# Patient Record
Sex: Female | Born: 1960 | Race: White | Hispanic: No | Marital: Married | Smoking: Former smoker
Health system: Southern US, Community
[De-identification: ages and names within clinical notes are randomized; demographics above are authoritative.]

## PROBLEM LIST (undated history)

## (undated) DIAGNOSIS — K635 Polyp of colon: Secondary | ICD-10-CM

## (undated) DIAGNOSIS — M75100 Unspecified rotator cuff tear or rupture of unspecified shoulder, not specified as traumatic: Secondary | ICD-10-CM

## (undated) DIAGNOSIS — R0781 Pleurodynia: Secondary | ICD-10-CM

## (undated) DIAGNOSIS — J45909 Unspecified asthma, uncomplicated: Secondary | ICD-10-CM

## (undated) DIAGNOSIS — K56609 Unspecified intestinal obstruction, unspecified as to partial versus complete obstruction: Secondary | ICD-10-CM

## (undated) DIAGNOSIS — K900 Celiac disease: Secondary | ICD-10-CM

## (undated) DIAGNOSIS — J449 Chronic obstructive pulmonary disease, unspecified: Secondary | ICD-10-CM

## (undated) DIAGNOSIS — K5010 Crohn's disease of large intestine without complications: Secondary | ICD-10-CM

## (undated) DIAGNOSIS — K8020 Calculus of gallbladder without cholecystitis without obstruction: Secondary | ICD-10-CM

## (undated) DIAGNOSIS — C801 Malignant (primary) neoplasm, unspecified: Secondary | ICD-10-CM

## (undated) DIAGNOSIS — K5190 Ulcerative colitis, unspecified, without complications: Secondary | ICD-10-CM

## (undated) DIAGNOSIS — K589 Irritable bowel syndrome without diarrhea: Secondary | ICD-10-CM

## (undated) DIAGNOSIS — E8801 Alpha-1-antitrypsin deficiency: Secondary | ICD-10-CM

## (undated) DIAGNOSIS — I313 Pericardial effusion (noninflammatory): Secondary | ICD-10-CM

## (undated) HISTORY — DX: Crohn's disease of large intestine without complications: K50.10

## (undated) HISTORY — DX: Alpha-1-antitrypsin deficiency: E88.01

## (undated) HISTORY — DX: Unspecified intestinal obstruction, unspecified as to partial versus complete obstruction: K56.609

## (undated) HISTORY — DX: Irritable bowel syndrome, unspecified: K58.9

## (undated) HISTORY — DX: Celiac disease: K90.0

## (undated) HISTORY — PX: FOOT SURGERY: SHX648

## (undated) HISTORY — DX: Pericardial effusion (noninflammatory): I31.3

## (undated) HISTORY — DX: Polyp of colon: K63.5

## (undated) HISTORY — PX: TONSILLECTOMY: SUR1361

## (undated) HISTORY — DX: Unspecified rotator cuff tear or rupture of unspecified shoulder, not specified as traumatic: M75.100

## (undated) HISTORY — DX: Pleurodynia: R07.81

## (undated) HISTORY — PX: CHOLECYSTECTOMY: SHX55

## (undated) HISTORY — PX: BUNIONECTOMY WITH HAMMERTOE RECONSTRUCTION: SHX5600

## (undated) HISTORY — DX: Ulcerative colitis, unspecified, without complications: K51.90

## (undated) HISTORY — DX: Calculus of gallbladder without cholecystitis without obstruction: K80.20

## (undated) HISTORY — PX: ABDOMINAL HYSTERECTOMY: SHX81

---

## 1988-03-16 DIAGNOSIS — C569 Malignant neoplasm of unspecified ovary: Secondary | ICD-10-CM

## 1988-03-16 HISTORY — DX: Malignant neoplasm of unspecified ovary: C56.9

## 2015-06-12 ENCOUNTER — Emergency Department (HOSPITAL_COMMUNITY)
Admission: EM | Admit: 2015-06-12 | Discharge: 2015-06-12 | Disposition: A | Discharge status: Home / Self Care | Payer: Medicaid Other | Attending: Emergency Medicine | Admitting: Emergency Medicine

## 2015-06-12 ENCOUNTER — Encounter (HOSPITAL_COMMUNITY): Payer: Self-pay | Admitting: *Deleted

## 2015-06-12 ENCOUNTER — Emergency Department (HOSPITAL_COMMUNITY): Payer: Medicaid Other

## 2015-06-12 DIAGNOSIS — L80 Vitiligo: Secondary | ICD-10-CM | POA: Diagnosis not present

## 2015-06-12 DIAGNOSIS — Z859 Personal history of malignant neoplasm, unspecified: Secondary | ICD-10-CM | POA: Diagnosis not present

## 2015-06-12 DIAGNOSIS — R0789 Other chest pain: Secondary | ICD-10-CM | POA: Insufficient documentation

## 2015-06-12 DIAGNOSIS — J449 Chronic obstructive pulmonary disease, unspecified: Secondary | ICD-10-CM | POA: Diagnosis not present

## 2015-06-12 DIAGNOSIS — R079 Chest pain, unspecified: Secondary | ICD-10-CM | POA: Diagnosis present

## 2015-06-12 HISTORY — DX: Chronic obstructive pulmonary disease, unspecified: J44.9

## 2015-06-12 HISTORY — DX: Malignant (primary) neoplasm, unspecified: C80.1

## 2015-06-12 HISTORY — DX: Unspecified asthma, uncomplicated: J45.909

## 2015-06-12 LAB — BASIC METABOLIC PANEL
ANION GAP: 9 (ref 5–15)
BUN: 14 mg/dL (ref 6–20)
CHLORIDE: 103 mmol/L (ref 101–111)
CO2: 26 mmol/L (ref 22–32)
Calcium: 9.7 mg/dL (ref 8.9–10.3)
Creatinine, Ser: 0.95 mg/dL (ref 0.44–1.00)
Glucose, Bld: 102 mg/dL — ABNORMAL HIGH (ref 65–99)
POTASSIUM: 4.1 mmol/L (ref 3.5–5.1)
SODIUM: 138 mmol/L (ref 135–145)

## 2015-06-12 LAB — CBC
HEMATOCRIT: 38 % (ref 36.0–46.0)
HEMOGLOBIN: 12.8 g/dL (ref 12.0–15.0)
MCH: 30.4 pg (ref 26.0–34.0)
MCHC: 33.7 g/dL (ref 30.0–36.0)
MCV: 90.3 fL (ref 78.0–100.0)
Platelets: 265 10*3/uL (ref 150–400)
RBC: 4.21 MIL/uL (ref 3.87–5.11)
RDW: 12.3 % (ref 11.5–15.5)
WBC: 7.1 10*3/uL (ref 4.0–10.5)

## 2015-06-12 LAB — TROPONIN I

## 2015-06-12 MED ORDER — IBUPROFEN 400 MG PO TABS: 400.0000 mg | ORAL_TABLET | Freq: Four times a day (QID) | ORAL | Status: DC | PRN

## 2015-06-12 NOTE — ED Notes (Signed)
Patient not in room yet 

## 2015-06-12 NOTE — Care Management (Signed)
ED CM scheduled follow up apt with [facility] walk-in [facility] 3/31 at [place] for appt provided to patient.

## 2015-06-12 NOTE — ED Provider Notes (Signed)
CSN: [id number]     Arrival date & time 06/12/15  1804 History   First MD Initiated Contact with Patient 06/12/15 2252     Chief Complaint  Patient presents with  . Chest Pain      Patient is a 55 y.o. female presenting with chest pain. The history is provided by the patient.  Chest Pain Associated symptoms: no abdominal pain, no back pain, no headache, no nausea, no numbness, no shortness of breath, not vomiting and no weakness   patient withAnterior chest pain over the last 3 weeks. His been constant. Worse with certain movements. Worse with deep breath. No swelling or legs. She did have a remote history of pericardial window for pericardial effusion. No fevers or chills. No cough. States she does not have a primary care doctor yet. Will tear from [place]. No swelling in her legs. Pain is dull and constant. No radiation to neck or arm.  Past Medical History  Diagnosis Date  . Asthma   . COPD (chronic obstructive pulmonary disease) ([place])   . Cancer [facility])    History reviewed. No pertinent past surgical history. No family history on file. Social History  Substance Use Topics  . Smoking status: Former Research scientist (life sciences)  . Smokeless tobacco: None  . Alcohol Use: No   OB History    No data available     Review of Systems  Constitutional: Negative for activity change and appetite change.  Eyes: Negative for pain.  Respiratory: Negative for chest tightness and shortness of breath.   Cardiovascular: Positive for chest pain. Negative for leg swelling.  Gastrointestinal: Negative for nausea, vomiting, abdominal pain and diarrhea.  Genitourinary: Negative for flank pain.  Musculoskeletal: Negative for back pain and neck stiffness.  Skin: Negative for rash.  Neurological: Negative for weakness, numbness and headaches.  Psychiatric/Behavioral: Negative for behavioral problems.      Allergies  Review of patient's allergies indicates no known allergies.  Home Medications   Prior to  Admission medications   Medication Sig Start Date End Date Taking? Authorizing Provider  ibuprofen (ADVIL,MOTRIN) 400 MG tablet Take 1 tablet (400 mg total) by mouth every 6 (six) hours as needed. 06/12/15   Davonna Belling, MD   BP 129/80 mmHg  Pulse 62  Temp(Src) 97.7 F (36.5 C) (Oral)  Resp 20  Wt 170 lb 8 oz (77.338 kg)  SpO2 98% Physical Exam  Constitutional: She is oriented to person, place, and time. She appears well-developed and well-nourished.  HENT:  Head: Normocephalic and atraumatic.  Eyes: Pupils are equal, round, and reactive to light.  Neck: Normal range of motion.  Cardiovascular: Normal rate, regular rhythm and normal heart sounds.   No murmur heard. Pulmonary/Chest: Effort normal and breath sounds normal. No respiratory distress. She has no wheezes. She has no rales. She exhibits tenderness.  Tenderness to anterior chest wall, particularly on the side. No rash.  Abdominal: Soft. Bowel sounds are normal. She exhibits no distension. There is no tenderness.  Musculoskeletal: Normal range of motion.  Neurological: She is alert and oriented to person, place, and time. No cranial nerve deficit.  Skin: Skin is warm and dry.  Patient has vitiligo  Psychiatric: She has a normal mood and affect. Her speech is normal.  Nursing note and vitals reviewed.   ED Course  Procedures (including critical care time) Labs Review Labs Reviewed  BASIC METABOLIC PANEL - Abnormal; Notable for the following:    Glucose, Bld 102 (*)    All other components  within normal limits  CBC  TROPONIN I    Imaging Review Dg Chest 2 View  06/12/2015  CLINICAL DATA:  Chest pain for 3 weeks EXAM: CHEST  2 VIEW COMPARISON:  None. FINDINGS: Cardiomediastinal silhouette is unremarkable. No infiltrate or pleural effusion. No pulmonary edema. Bony thorax is unremarkable. IMPRESSION: No active cardiopulmonary disease. Electronically Signed   By: Lahoma Crocker M.D.   On: 06/12/2015 19:39   I have  personally reviewed and evaluated these images and lab results as part of my medical decision-making.   EKG Interpretation   Date/Time:  Wednesday June 12 2015 18:11:57 EDT Ventricular Rate:  69 PR Interval:  134 QRS Duration: 90 QT Interval:  388 QTC Calculation: 415 R Axis:   78 Text Interpretation:  Normal sinus rhythm Nonspecific ST abnormality  Abnormal ECG Confirmed by Alvino Chapel  MD, Ovid Curd [phone]) on 06/12/2015  10:54:21 PM      MDM   Final diagnoses:  Chest pain, unspecified chest pain type    Patient with chest pain. Likely chest wall but has had pericardial effusion the past and is post window. She does not know exactly why she had the fluid. Does not her primary care doctor in town. Case management will help arrange this. Will discharge home.    Davonna Belling, MD 06/13/15 [phone]

## 2015-06-12 NOTE — Discharge Instructions (Signed)
Nonspecific Chest Pain  °Chest pain can be caused by many different conditions. There is always a chance that your pain could be related to something serious, such as a heart attack or a blood clot in your lungs. Chest pain can also be caused by conditions that are not life-threatening. If you have chest pain, it is very important to follow up with your health care provider. °CAUSES  °Chest pain can be caused by: °· Heartburn. °· Pneumonia or bronchitis. °· Anxiety or stress. °· Inflammation around your heart (pericarditis) or lung (pleuritis or pleurisy). °· A blood clot in your lung. °· A collapsed lung (pneumothorax). It can develop suddenly on its own (spontaneous pneumothorax) or from trauma to the chest. °· Shingles infection (varicella-zoster virus). °· Heart attack. °· Damage to the bones, muscles, and cartilage that make up your chest wall. This can include: °¨ Bruised bones due to injury. °¨ Strained muscles or cartilage due to frequent or repeated coughing or overwork. °¨ Fracture to one or more ribs. °¨ Sore cartilage due to inflammation (costochondritis). °RISK FACTORS  °Risk factors for chest pain may include: °· Activities that increase your risk for trauma or injury to your chest. °· Respiratory infections or conditions that cause frequent coughing. °· Medical conditions or overeating that can cause heartburn. °· Heart disease or family history of heart disease. °· Conditions or health behaviors that increase your risk of developing a blood clot. °· Having had chicken pox (varicella zoster). °SIGNS AND SYMPTOMS °Chest pain can feel like: °· Burning or tingling on the surface of your chest or deep in your chest. °· Crushing, pressure, aching, or squeezing pain. °· Dull or sharp pain that is worse when you move, cough, or take a deep breath. °· Pain that is also felt in your back, neck, shoulder, or arm, or pain that spreads to any of these areas. °Your chest pain may come and go, or it may stay  constant. °DIAGNOSIS °Lab tests or other studies may be needed to find the cause of your pain. Your health care provider may have you take a test called an ambulatory ECG (electrocardiogram). An ECG records your heartbeat patterns at the time the test is performed. You may also have other tests, such as: °· Transthoracic echocardiogram (TTE). During echocardiography, sound waves are used to create a picture of all of the heart structures and to look at how blood flows through your heart. °· Transesophageal echocardiogram (TEE). This is a more advanced imaging test that obtains images from inside your body. It allows your health care provider to see your heart in finer detail. °· Cardiac monitoring. This allows your health care provider to monitor your heart rate and rhythm in real time. °· Holter monitor. This is a portable device that records your heartbeat and can help to diagnose abnormal heartbeats. It allows your health care provider to track your heart activity for several days, if needed. °· Stress tests. These can be done through exercise or by taking medicine that makes your heart beat more quickly. °· Blood tests. °· Imaging tests. °TREATMENT  °Your treatment depends on what is causing your chest pain. Treatment may include: °· Medicines. These may include: °¨ Acid blockers for heartburn. °¨ Anti-inflammatory medicine. °¨ Pain medicine for inflammatory conditions. °¨ Antibiotic medicine, if an infection is present. °¨ Medicines to dissolve blood clots. °¨ Medicines to treat coronary artery disease. °· Supportive care for conditions that do not require medicines. This may include: °¨ Resting. °¨ Applying heat   or cold packs to injured areas. °¨ Limiting activities until pain decreases. °HOME CARE INSTRUCTIONS °· If you were prescribed an antibiotic medicine, finish it all even if you start to feel better. °· Avoid any activities that bring on chest pain. °· Do not use any tobacco products, including  cigarettes, chewing tobacco, or electronic cigarettes. If you need help quitting, ask your health care provider. °· Do not drink alcohol. °· Take medicines only as directed by your health care provider. °· Keep all follow-up visits as directed by your health care provider. This is important. This includes any further testing if your chest pain does not go away. °· If heartburn is the cause for your chest pain, you may be told to keep your head raised (elevated) while sleeping. This reduces the chance that acid will go from your stomach into your esophagus. °· Make lifestyle changes as directed by your health care provider. These may include: °¨ Getting regular exercise. Ask your health care provider to suggest some activities that are safe for you. °¨ Eating a heart-healthy diet. A registered dietitian can help you to learn healthy eating options. °¨ Maintaining a healthy weight. °¨ Managing diabetes, if necessary. °¨ Reducing stress. °SEEK MEDICAL CARE IF: °· Your chest pain does not go away after treatment. °· You have a rash with blisters on your chest. °· You have a fever. °SEEK IMMEDIATE MEDICAL CARE IF:  °· Your chest pain is worse. °· You have an increasing cough, or you cough up blood. °· You have severe abdominal pain. °· You have severe weakness. °· You faint. °· You have chills. °· You have sudden, unexplained chest discomfort. °· You have sudden, unexplained discomfort in your arms, back, neck, or jaw. °· You have shortness of breath at any time. °· You suddenly start to sweat, or your skin gets clammy. °· You feel nauseous or you vomit. °· You suddenly feel light-headed or dizzy. °· Your heart begins to beat quickly, or it feels like it is skipping beats. °These symptoms may represent a serious problem that is an emergency. Do not wait to see if the symptoms will go away. Get medical help right away. Call your local emergency services (911 in the [place] Do not drive yourself to the hospital. °  °This  information is not intended to replace advice given to you by your health care provider. Make sure you discuss any questions you have with your health care provider. °  °Document Released: 12/10/2004 Document Revised: 03/23/2014 Document Reviewed: 10/06/2013 °Elsevier Interactive Patient Education ©2016 [employer] Inc. ° °

## 2015-06-12 NOTE — ED Notes (Signed)
The pt is c/o pain across her lower chest on both sides of her chest for 3 weeks   She has had some dizziness she just moved to [place] and does not have doctors here

## 2015-06-14 ENCOUNTER — Ambulatory Visit: Payer: Medicaid Other | Attending: Family Medicine | Admitting: Physician Assistant

## 2015-06-14 ENCOUNTER — Other Ambulatory Visit: Payer: Self-pay

## 2015-06-14 ENCOUNTER — Other Ambulatory Visit: Payer: Self-pay | Admitting: Physician Assistant

## 2015-06-14 ENCOUNTER — Telehealth: Payer: Self-pay

## 2015-06-14 VITALS — BP 143/88 | HR 61 | Temp 98.0°F | Resp 16 | Wt 169.0 lb

## 2015-06-14 DIAGNOSIS — E8801 Alpha-1-antitrypsin deficiency: Secondary | ICD-10-CM | POA: Diagnosis not present

## 2015-06-14 DIAGNOSIS — R0781 Pleurodynia: Secondary | ICD-10-CM | POA: Insufficient documentation

## 2015-06-14 DIAGNOSIS — L80 Vitiligo: Secondary | ICD-10-CM | POA: Insufficient documentation

## 2015-06-14 DIAGNOSIS — Z1239 Encounter for other screening for malignant neoplasm of breast: Secondary | ICD-10-CM

## 2015-06-14 DIAGNOSIS — J441 Chronic obstructive pulmonary disease with (acute) exacerbation: Secondary | ICD-10-CM | POA: Diagnosis not present

## 2015-06-14 DIAGNOSIS — J4551 Severe persistent asthma with (acute) exacerbation: Secondary | ICD-10-CM | POA: Insufficient documentation

## 2015-06-14 DIAGNOSIS — J9801 Acute bronchospasm: Secondary | ICD-10-CM | POA: Diagnosis not present

## 2015-06-14 DIAGNOSIS — Z859 Personal history of malignant neoplasm, unspecified: Secondary | ICD-10-CM | POA: Insufficient documentation

## 2015-06-14 DIAGNOSIS — Z148 Genetic carrier of other disease: Secondary | ICD-10-CM | POA: Insufficient documentation

## 2015-06-14 DIAGNOSIS — Z7951 Long term (current) use of inhaled steroids: Secondary | ICD-10-CM | POA: Insufficient documentation

## 2015-06-14 DIAGNOSIS — Z8543 Personal history of malignant neoplasm of ovary: Secondary | ICD-10-CM

## 2015-06-14 MED ORDER — FLUTICASONE PROPIONATE 50 MCG/ACT NA SUSP: 1.0000 | Freq: Every day | NASAL | Status: DC

## 2015-06-14 MED ORDER — ALBUTEROL SULFATE (5 MG/ML) 0.5% IN NEBU: 2.5000 mg | INHALATION_SOLUTION | Freq: Four times a day (QID) | RESPIRATORY_TRACT | Status: DC | PRN

## 2015-06-14 MED ORDER — PREDNISONE 10 MG PO TABS: ORAL_TABLET | ORAL | Status: DC

## 2015-06-14 MED ORDER — FLUTICASONE FUROATE-VILANTEROL 100-25 MCG/INH IN AEPB: 1.0000 | INHALATION_SPRAY | Freq: Every day | RESPIRATORY_TRACT | Status: DC

## 2015-06-14 MED ORDER — FLUTICASONE PROPIONATE HFA 110 MCG/ACT IN AERO: 1.0000 | INHALATION_SPRAY | Freq: Two times a day (BID) | RESPIRATORY_TRACT | Status: DC

## 2015-06-14 MED ORDER — ALBUTEROL SULFATE HFA 108 (90 BASE) MCG/ACT IN AERS: 1.0000 | INHALATION_SPRAY | Freq: Four times a day (QID) | RESPIRATORY_TRACT | Status: DC | PRN

## 2015-06-14 MED ORDER — ALBUTEROL SULFATE (2.5 MG/3ML) 0.083% IN NEBU: 2.5000 mg | INHALATION_SOLUTION | Freq: Four times a day (QID) | RESPIRATORY_TRACT | Status: DC | PRN

## 2015-06-14 NOTE — Progress Notes (Signed)
Patient ID: Carla Bell, female   DOB: 01/08/1961, 55 y.o.   MRN: [id number]   Carla Bell, is a 56 y.o. female  [id number]  [id number]  DOB - Jun 17, 1960  Chief Complaint  Patient presents with  . Follow-up    ED-chest pain        Subjective:  Chief Complaint and HPI: Carla Bell is a 55 y.o. female here today to establish care and for a follow up visit after being seen in the ED for CP a few days ago.  CXR, cardiac enzymes, and EKG were unremarkable.  The pain had started about 3 weeks ago after she was walking her dog on a leash and it lunged forward and pulled her forcefully.  Since that time, she has had pain in her chest, R arm, and upper back.  Worse with movement and deep breathing.  She also has a history of "extreme" breathing difficulties-diagnosis of asthma and COPD as well as being a carrier of alpha-1 antitrypsin.  She has been off of her maintenance meds for a while.  She has SOB and uses albuterol and flovent daily. She continues to have the pains in her chest, back, and R arm and feels SOB most of the time. She hasn't had a MMG since late 1990s and has a history of Ovarian CA in 1989 with complete hysterectomy.    ROS:   Constitutional:  No f/c, No night sweats, No unexplained weight loss. EENT:  No vision changes, No blurry vision, No hearing changes. No mouth, throat, or ear problems.  Respiratory: No cough, +SOB Cardiac: +CP, no palpitations GI:  No abd pain, No N/V/D. GU: No Urinary s/sx Musculoskeletal: No joint pain Neuro: No headache, no dizziness, no motor weakness.  Skin: No rash Endocrine:  No polydipsia. No polyuria.  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 Asthma   . COPD (chronic obstructive pulmonary disease) ([place])   . Cancer [facility])      MEDICATIONS AT HOME: Prior to Admission medications   Medication Sig Start Date End Date Taking? Authorizing Provider  albuterol (PROVENTIL HFA;VENTOLIN HFA) 108 (90 Base) MCG/ACT inhaler Inhale 1 puff into the lungs every 6 (six) hours as needed for wheezing or shortness of breath. 06/14/15  Yes Argentina Donovan, PA-C  albuterol (PROVENTIL) (5 MG/ML) 0.5% nebulizer solution Take 0.5 mLs (2.5 mg total) by nebulization every 6 (six) hours as needed for wheezing or shortness of breath. 06/14/15  Yes Dionne Bucy Menelik Mcfarren, PA-C  fluticasone (FLONASE) 50 MCG/ACT nasal spray Place 1 spray into both nostrils daily. 06/14/15  Yes Dionne Bucy Monzerrat Wellen, PA-C  fluticasone (FLOVENT HFA) 110 MCG/ACT inhaler Inhale 1 puff into the lungs 2 (two) times daily. 06/14/15  Yes Dionne Bucy Gagan Dillion, PA-C  fluticasone furoate-vilanterol (BREO ELLIPTA) 100-25 MCG/INH AEPB Inhale 1 puff into the lungs daily. 06/14/15  Yes Dionne Bucy Kden Wagster, PA-C  ibuprofen (ADVIL,MOTRIN) 400 MG tablet Take 1 tablet (400 mg total) by mouth every 6 (six) hours as needed. 06/12/15   Davonna Belling, MD  predniSONE (DELTASONE) 10 MG tablet 6,5,4,3,2,1 Take each days dose all at once with food in am 06/14/15   Argentina Donovan, PA-C     Objective:  EXAM:   Filed Vitals:   06/14/15 0939  BP: 143/88  Pulse: 61  Temp: 98 F (36.7  C)  Resp: 16  Weight: 169 lb (76.658 kg)  SpO2: 100%    General appearance : A&OX3. NAD. Non-toxic-appearing HEENT: Atraumatic and Normocephalic.  PERRLA. EOM intact.  TM clear B. Mouth-MMM, post pharynx WNL w/o erythema, No PND. Neck: supple, no JVD. No cervical lymphadenopathy. No thyromegaly Chest/Lungs:  Breathing-non-labored, breath sounds  without rales or rhonchi.  She does have diffuse mild wheezing throughout. CVS: S1 S2 regular, no murmurs, gallops, rubs  Abdomen: Bowel sounds present, Non tender and not distended with no gaurding, rigidity or rebound. Extremities: B/L Lower Ext shows no edema, both legs are  warm to touch with = pulse throughout.  She has TTP along the anterior chest wall and the trapezius Neurology:  CN II-XII grossly intact, Non focal.   Psych:  TP linear. J/I WNL. Normal speech. Appropriate eye contact and affect.  Skin:  No Rash  Assessment & Plan   1. Bronchospasm - predniSONE (DELTASONE) 10 MG tablet; 6,5,4,3,2,1 Take each days dose all at once with food in am  Dispense: 21 tablet; Refill: 0 - fluticasone furoate-vilanterol (BREO ELLIPTA) 100-25 MCG/INH AEPB; Inhale 1 puff into the lungs daily.  Dispense: 60 each; Refill: 3 - fluticasone (FLOVENT HFA) 110 MCG/ACT inhaler; Inhale 1 puff into the lungs 2 (two) times daily.  Dispense: 1 Inhaler; Refill: 3 - albuterol (PROVENTIL) (5 MG/ML) 0.5% nebulizer solution; Take 0.5 mLs (2.5 mg total) by nebulization every 6 (six) hours as needed for wheezing or shortness of breath.  Dispense: 20 mL; Refill: 3 - albuterol (PROVENTIL HFA;VENTOLIN HFA) 108 (90 Base) MCG/ACT inhaler; Inhale 1 puff into the lungs every 6 (six) hours as needed for wheezing or shortness of breath.  Dispense: 1 Inhaler; Refill: 3  2. Breast cancer screening  - MM DIGITAL SCREENING BILATERAL; Future  3. Asthma, severe persistent, with acute exacerbation  - fluticasone furoate-vilanterol (BREO ELLIPTA) 100-25 MCG/INH AEPB; Inhale 1 puff into the lungs daily.  Dispense: 60 each; Refill: 3 - fluticasone (FLOVENT HFA) 110 MCG/ACT inhaler; Inhale 1 puff into the lungs 2 (two) times daily.  Dispense: 1 Inhaler; Refill: 3 - albuterol (PROVENTIL) (5 MG/ML) 0.5% nebulizer solution; Take 0.5 mLs (2.5 mg total) by nebulization every 6 (six) hours as needed for wheezing or shortness of breath.  Dispense: 20 mL; Refill: 3 - albuterol (PROVENTIL HFA;VENTOLIN HFA) 108 (90 Base) MCG/ACT inhaler; Inhale 1 puff into the lungs every 6 (six) hours as needed for wheezing or shortness of breath.  Dispense: 1 Inhaler; Refill: 3  4. COPD  (HCC)-pulmonary referral - fluticasone  furoate-vilanterol (BREO ELLIPTA) 100-25 MCG/INH AEPB; Inhale 1 puff into the lungs daily.  Dispense: 60 each; Refill: 3 - fluticasone (FLOVENT HFA) 110 MCG/ACT inhaler; Inhale 1 puff into the lungs 2 (two) times daily.  Dispense: 1 Inhaler; Refill: 3 - albuterol (PROVENTIL) (5 MG/ML) 0.5% nebulizer solution; Take 0.5 mLs (2.5 mg total) by nebulization every 6 (six) hours as needed for wheezing or shortness of breath.  Dispense: 20 mL; Refill: 3 - albuterol (PROVENTIL HFA;VENTOLIN HFA) 108 (90 Base) MCG/ACT inhaler; Inhale 1 puff into the lungs every 6 (six) hours as needed for wheezing or shortness of breath.  Dispense: 1 Inhaler; Refill: 3  5. Alpha-1-antitrypsin deficiency carrier [facility]) Added to problem list. Pulmonology referral due to chronic lung disease.   6. Vitiligo Added to problem list  7. Pleuritic CP Prednisone should also help with this.  She has not had to take steroids in a long time.    Patient have been  counseled extensively about nutrition and exercise  Return in about 3 weeks (around 07/05/2015).  The patient was given clear instructions to go to ER or return to medical center if symptoms don't improve, worsen or new problems develop. The patient verbalized understanding. The patient was told to call to get lab results if they haven't heard anything in the next week.     Freeman Caldron, PA-C [facility] [place], [place]   06/14/2015, 11:02 AM

## 2015-06-14 NOTE — Progress Notes (Signed)
Patient here for follow up from the ED Was seen for chest pain ED seems to feel it is more muscular Patient has a lot of breathing issues History of asthma and COPD

## 2015-06-14 NOTE — Telephone Encounter (Signed)
Returned patient phone call Patients insurance is requesting proior auth on her inhalers Informed patient the pharmacy is supposed to fax that information over to [place]

## 2015-07-03 ENCOUNTER — Encounter: Payer: Self-pay | Admitting: Internal Medicine

## 2015-07-03 ENCOUNTER — Ambulatory Visit: Payer: Medicaid Other | Attending: Internal Medicine | Admitting: Internal Medicine

## 2015-07-03 ENCOUNTER — Other Ambulatory Visit: Payer: Self-pay | Admitting: *Deleted

## 2015-07-03 VITALS — BP 117/80 | HR 85 | Temp 97.8°F | Wt 176.0 lb

## 2015-07-03 DIAGNOSIS — E8801 Alpha-1-antitrypsin deficiency: Secondary | ICD-10-CM | POA: Diagnosis not present

## 2015-07-03 DIAGNOSIS — J441 Chronic obstructive pulmonary disease with (acute) exacerbation: Secondary | ICD-10-CM | POA: Diagnosis not present

## 2015-07-03 DIAGNOSIS — Z87898 Personal history of other specified conditions: Secondary | ICD-10-CM | POA: Insufficient documentation

## 2015-07-03 DIAGNOSIS — J45909 Unspecified asthma, uncomplicated: Secondary | ICD-10-CM | POA: Insufficient documentation

## 2015-07-03 DIAGNOSIS — Z8719 Personal history of other diseases of the digestive system: Secondary | ICD-10-CM

## 2015-07-03 DIAGNOSIS — Z148 Genetic carrier of other disease: Secondary | ICD-10-CM

## 2015-07-03 DIAGNOSIS — Z131 Encounter for screening for diabetes mellitus: Secondary | ICD-10-CM | POA: Insufficient documentation

## 2015-07-03 DIAGNOSIS — J4550 Severe persistent asthma, uncomplicated: Secondary | ICD-10-CM | POA: Diagnosis not present

## 2015-07-03 DIAGNOSIS — Z79899 Other long term (current) drug therapy: Secondary | ICD-10-CM | POA: Insufficient documentation

## 2015-07-03 LAB — HEMOGLOBIN A1C
Hgb A1c MFr Bld: 5.1 % (ref <5.7)
MEAN PLASMA GLUCOSE: 100 mg/dL

## 2015-07-03 LAB — TSH: TSH: 0.9 m[IU]/L

## 2015-07-03 MED ORDER — PSYLLIUM 28 % PO PACK: 1.0000 | PACK | Freq: Two times a day (BID) | ORAL | Status: DC | PRN

## 2015-07-03 MED ORDER — IPRATROPIUM-ALBUTEROL 0.5-2.5 (3) MG/3ML IN SOLN: 3.0000 mL | Freq: Four times a day (QID) | RESPIRATORY_TRACT | Status: DC | PRN

## 2015-07-03 MED ORDER — BECLOMETHASONE DIPROPIONATE 40 MCG/ACT IN AERS: 2.0000 | INHALATION_SPRAY | Freq: Two times a day (BID) | RESPIRATORY_TRACT | Status: DC | PRN

## 2015-07-03 MED ORDER — BUDESONIDE-FORMOTEROL FUMARATE 160-4.5 MCG/ACT IN AERO: 2.0000 | INHALATION_SPRAY | Freq: Two times a day (BID) | RESPIRATORY_TRACT | Status: DC

## 2015-07-03 MED ORDER — BECLOMETHASONE DIPROPIONATE 40 MCG/ACT IN AERS
2.0000 | INHALATION_SPRAY | Freq: Two times a day (BID) | RESPIRATORY_TRACT | Status: DC | PRN
Start: 2015-07-03 — End: 2015-07-12

## 2015-07-03 NOTE — Patient Instructions (Addendum)
Make lab appt to come by for fasting cholesterol panel - fill out ppwk request for outside notes/colonoscopy reports at outside hospital.  High-Fiber Diet Fiber, also called dietary fiber, is a type of carbohydrate found in fruits, vegetables, whole grains, and beans. A high-fiber diet can have many health benefits. Your health care provider may recommend a high-fiber diet to help:  Prevent constipation. Fiber can make your bowel movements more regular.  Lower your cholesterol.  Relieve hemorrhoids, uncomplicated diverticulosis, or irritable bowel syndrome.  Prevent overeating as part of a weight-loss plan.  Prevent heart disease, type 2 diabetes, and certain cancers. WHAT IS MY PLAN? The recommended daily intake of fiber includes:  38 grams for men under age 33.  74 grams for men over age 56.  48 grams for women under age 47.  74 grams for women over age 23. You can get the recommended daily intake of dietary fiber by eating a variety of fruits, vegetables, grains, and beans. Your health care provider may also recommend a fiber supplement if it is not possible to get enough fiber through your diet. WHAT DO I NEED TO KNOW ABOUT A HIGH-FIBER DIET? 1. Fiber supplements have not been widely studied for their effectiveness, so it is better to get fiber through food sources. 2. Always check the fiber content on thenutrition facts label of any prepackaged food. Look for foods that contain at least 5 grams of fiber per serving. 3. Ask your dietitian if you have questions about specific foods that are related to your condition, especially if those foods are not listed in the following section. 4. Increase your daily fiber consumption gradually. Increasing your intake of dietary fiber too quickly may cause bloating, cramping, or gas. 5. Drink plenty of water. Water helps you to digest fiber. WHAT FOODS CAN I EAT? Grains Whole-grain breads. Multigrain cereal. Oats and oatmeal. Brown rice.  Barley. Bulgur wheat. Canones. Bran muffins. Popcorn. Rye wafer crackers. Vegetables Sweet potatoes. Spinach. Kale. Artichokes. Cabbage. Broccoli. Green peas. Carrots. Squash. Fruits Berries. Pears. Apples. Oranges. Avocados. Prunes and raisins. Dried figs. Meats and Other Protein Sources Navy, kidney, pinto, and soy beans. Split peas. Lentils. Nuts and seeds. Dairy Fiber-fortified yogurt. Beverages Fiber-fortified soy milk. Fiber-fortified orange juice. Other Fiber bars. The items listed above may not be a complete list of recommended foods or beverages. Contact your dietitian for more options. WHAT FOODS ARE NOT RECOMMENDED? Grains White bread. Pasta made with refined flour. White rice. Vegetables Fried potatoes. Canned vegetables. Well-cooked vegetables.  Fruits Fruit juice. Cooked, strained fruit. Meats and Other Protein Sources Fatty cuts of meat. Fried Sales executive or fried fish. Dairy Milk. Yogurt. Cream cheese. Sour cream. Beverages Soft drinks. Other Cakes and pastries. Butter and oils. The items listed above may not be a complete list of foods and beverages to avoid. Contact your dietitian for more information. WHAT ARE SOME TIPS FOR INCLUDING HIGH-FIBER FOODS IN MY DIET?  Eat a wide variety of high-fiber foods.  Make sure that half of all grains consumed each day are whole grains.  Replace breads and cereals made from refined flour or white flour with whole-grain breads and cereals.  Replace white rice with brown rice, bulgur wheat, or millet.  Start the day with a breakfast that is high in fiber, such as a cereal that contains at least 5 grams of fiber per serving.  Use beans in place of meat in soups, salads, or pasta.  Eat high-fiber snacks, such as berries, raw vegetables, nuts, or  popcorn.   This information is not intended to replace advice given to you by your health care provider. Make sure you discuss any questions you have with your health care provider.    Document Released: 03/02/2005 Document Revised: 03/23/2014 Document Reviewed: 08/15/2013 Elsevier Interactive Patient Education 2016 Elsevier Inc.  - Chronic Obstructive Pulmonary Disease Chronic obstructive pulmonary disease (COPD) is a common lung problem. In COPD, the flow of air from the lungs is limited. The way your lungs work will probably never return to normal, but there are things you can do to improve your lungs and make yourself feel better. Your doctor may treat your condition with:  Medicines.  Oxygen.  Lung surgery.  Changes to your diet.  Rehabilitation. This may involve a team of specialists. HOME CARE  Take all medicines as told by your doctor.  Avoid medicines or cough syrups that dry up your airway (such as antihistamines) and do not allow you to get rid of thick spit. You do not need to avoid them if told differently by your doctor.  If you smoke, stop. Smoking makes the problem worse.  Avoid being around things that make your breathing worse (like smoke, chemicals, and fumes).  Use oxygen therapy and therapy to help improve your lungs (pulmonary rehabilitation) if told by your doctor. If you need home oxygen therapy, ask your doctor if you should buy a tool to measure your oxygen level (oximeter).  Avoid people who have a sickness you can catch (contagious).  Avoid going outside when it is very hot, cold, or humid.  Eat healthy foods. Eat smaller meals more often. Rest before meals.  Stay active, but remember to also rest.  Make sure to get all the shots (vaccines) your doctor recommends. Ask your doctor if you need a pneumonia shot.  Learn and use tips on how to relax.  Learn and use tips on how to control your breathing as told by your doctor. Try:  Breathing in (inhaling) through your nose for 1 second. Then, pucker your lips and breath out (exhale) through your lips for 2 seconds.  Putting one hand on your belly (abdomen). Breathe in slowly  through your nose for 1 second. Your hand on your belly should move out. Pucker your lips and breathe out slowly through your lips. Your hand on your belly should move in as you breathe out.  Learn and use controlled coughing to clear thick spit from your lungs. The steps are: 6. Lean your head a little forward. 7. Breathe in deeply. 8. Try to hold your breath for 3 seconds. 9. Keep your mouth slightly open while coughing 2 times. 10. Spit any thick spit out into a tissue. 11. Rest and do the steps again 1 or 2 times as needed. GET HELP IF:  You cough up more thick spit than usual.  There is a change in the color or thickness of the spit.  It is harder to breathe than usual.  Your breathing is faster than usual. GET HELP RIGHT AWAY IF:  You have shortness of breath while resting.  You have shortness of breath that stops you from:  Being able to talk.  Doing normal activities.  You chest hurts for longer than 5 minutes.  Your skin color is more blue than usual.  Your pulse oximeter shows that you have low oxygen for longer than 5 minutes. MAKE SURE YOU:  Understand these instructions.  Will watch your condition.  Will get help right away if  you are not doing well or get worse.   This information is not intended to replace advice given to you by your health care provider. Make sure you discuss any questions you have with your health care provider.   Document Released: 08/19/2007 Document Revised: 03/23/2014 Document Reviewed: 10/27/2012 Elsevier Interactive Patient Education [employer].

## 2015-07-03 NOTE — Progress Notes (Signed)
Carla Bell, is a 55 y.o. female  [id number]  [id number]  DOB - [birth year]  Chief Complaint  Patient presents with  . Establish Care  . Follow-up    Brochospasm        Subjective:   Carla Bell is a 55 y.o. female here today for a follow up visit.  Last seen 06/13/15 with PA.  Since seen, her respiratory status is about same, she is using her Albuterol nebulizer bid.  She was unable to get the inhalers Breo and Flovent due to prior aurthorization.  She has since finished the steroid taper.  Still c/o of chest tightness w/ exertion at times, but states her breathing currently is decent.  Does not want a nebs treatment now.  Intermittent pleuritic chest pain w/ deep inhalation.  Also mentions she use to live in [place], w/ frequent hospitalizations there due to pulm status and hx of bowel obstructions.  Her last hospitalizaton was for SBO, admitted for about 14days or so, did not require surgical intervention.   Her last colonoscopy was about 2 years ago, she mentions they removed polyps, but did not tell her when she needed repeat colonoscopy.  She states her bms are qod, she takes miralax daily, mentions eating a lot of vegs/greens as well.    Hx of A- 1 antitrypsin carrier, her mom was the first double lung transplant per pt due to this.  Denies smoking.  Patient has No headache, No chest pain, No abdominal pain - No Nausea, No new weakness tingling or numbness, No Cough.  No problems updated.  ALLERGIES: No Known Allergies  PAST MEDICAL HISTORY: Past Medical History  Diagnosis Date  . Asthma   . COPD (chronic obstructive pulmonary disease) ([place])   . Cancer [facility])     MEDICATIONS AT HOME: Prior to Admission medications   Medication Sig Start Date End Date Taking? Authorizing Provider  albuterol (PROVENTIL HFA;VENTOLIN HFA) 108 (90 Base) MCG/ACT inhaler Inhale 1 puff into the lungs every 6 (six) hours as needed for wheezing or shortness of breath. 06/14/15  Yes  Dionne Bucy McClung, PA-C  albuterol (PROVENTIL) (2.5 MG/3ML) 0.083% nebulizer solution Take 3 mLs (2.5 mg total) by nebulization every 6 (six) hours as needed for wheezing or shortness of breath. 06/14/15  Yes Dionne Bucy McClung, PA-C  fluticasone (FLONASE) 50 MCG/ACT nasal spray Place 1 spray into both nostrils daily. 06/14/15  Yes Dionne Bucy McClung, PA-C  ibuprofen (ADVIL,MOTRIN) 400 MG tablet Take 1 tablet (400 mg total) by mouth every 6 (six) hours as needed. 06/12/15  Yes Davonna Belling, MD  traZODone (DESYREL) 150 MG tablet Take 150 mg by mouth at bedtime.   Yes Historical Provider, MD  beclomethasone (QVAR) 40 MCG/ACT inhaler Inhale 2 puffs into the lungs 2 (two) times daily as needed (sob). 07/03/15   Maren Reamer, MD  budesonide-formoterol (SYMBICORT) 160-4.5 MCG/ACT inhaler Inhale 2 puffs into the lungs 2 (two) times daily. 07/03/15   Maren Reamer, MD  ipratropium-albuterol (DUONEB) 0.5-2.5 (3) MG/3ML SOLN Take 3 mLs by nebulization every 6 (six) hours as needed (sob/doe). 07/03/15   Maren Reamer, MD  psyllium (METAMUCIL SMOOTH TEXTURE) 28 % packet Take 1 packet by mouth 2 (two) times daily as needed. 07/03/15   Maren Reamer, MD     Objective:   Filed Vitals:   07/03/15 1035  BP: 117/80  Pulse: 85  Temp: 97.8 F (36.6 C)  TempSrc: Oral  Weight: 176 lb (79.833 kg)  SpO2: 96%  Exam General appearance : Awake, alert, not in any distress. Speech Clear. Not toxic looking.   Pleasant, aaox 3 HEENT: Atraumatic and Normocephalic, pupils equally reactive to light. Neck: supple, no JVD. No cervical lymphadenopathy.  Chest:Good air entry bilaterally, no added sounds.  No exp or inspiratory wheezing noted on exam today. CVS: S1 S2 regular, no murmurs/gallups or rubs. Abdomen: Bowel sounds active, Non tender and not distended with no gaurding, rigidity or rebound. Extremities: B/L Lower Ext shows no edema, both legs are warm to touch Neurology: Awake alert, and oriented X 3, CN  II-XII grossly intact, Non focal Skin:No Rash  Data Review No results found for: HGBA1C   Assessment & Plan   1. COPD exacerbation ([place]), improving,  - but had problems getting her inhalers due to ins denials - I talked to our pharmacist, Medicaid does cover Qvar and Symbicort, which I have subsequently ordered - added duonebs rx as well, - has Pulm appt on 4/28, encouraged to keep   2. Asthma, severe persistent, uncomplicated - stable currently, needs her inhalers, ordered. - I do not see need for nebulizer treatment now and pt declined as well since she did it this am.  3. Alpha-1-antitrypsin deficiency carrier ([place]) - pulm c/s - TSH - Lipid Panel; Future  4. H/O small bowel obstruction - ask pt to sign request of records release so can get all her records -high fiber diet recommended for daily bm, also add pysillium, cautioned patient about miralax dependence.  5. Screening for diabetes mellitus (DM) - Hemoglobin A1c     Patient have been counseled extensively about nutrition and exercise  Return in about 3 months (around 10/02/2015).  /prn  The patient was given clear instructions to go to ER or return to medical center if symptoms don't improve, worsen or new problems develop. The patient verbalized understanding. The patient was told to call to get lab results if they haven't heard anything in the next week.    Maren Reamer, MD, [facility] [place], [place]   07/03/2015, 11:13 AM

## 2015-07-04 ENCOUNTER — Other Ambulatory Visit: Payer: Self-pay | Admitting: Physician Assistant

## 2015-07-04 DIAGNOSIS — Z1231 Encounter for screening mammogram for malignant neoplasm of breast: Secondary | ICD-10-CM

## 2015-07-09 ENCOUNTER — Ambulatory Visit (INDEPENDENT_AMBULATORY_CARE_PROVIDER_SITE_OTHER): Payer: Medicaid Other | Admitting: Cardiovascular Disease

## 2015-07-09 ENCOUNTER — Institutional Professional Consult (permissible substitution): Payer: Medicaid Other | Admitting: Pulmonary Disease

## 2015-07-09 ENCOUNTER — Encounter: Payer: Self-pay | Admitting: Cardiovascular Disease

## 2015-07-09 VITALS — BP 121/75 | HR 73 | Wt 177.6 lb

## 2015-07-09 DIAGNOSIS — R0781 Pleurodynia: Secondary | ICD-10-CM

## 2015-07-09 DIAGNOSIS — R079 Chest pain, unspecified: Secondary | ICD-10-CM

## 2015-07-09 DIAGNOSIS — I319 Disease of pericardium, unspecified: Secondary | ICD-10-CM | POA: Diagnosis not present

## 2015-07-09 DIAGNOSIS — I313 Pericardial effusion (noninflammatory): Secondary | ICD-10-CM

## 2015-07-09 DIAGNOSIS — I3139 Other pericardial effusion (noninflammatory): Secondary | ICD-10-CM

## 2015-07-09 HISTORY — DX: Pleurodynia: R07.81

## 2015-07-09 HISTORY — DX: Pericardial effusion (noninflammatory): I31.3

## 2015-07-09 HISTORY — DX: Other pericardial effusion (noninflammatory): I31.39

## 2015-07-09 NOTE — Patient Instructions (Addendum)
Medication Instructions:  Your physician recommends that you continue on your current medications as directed. Please refer to the Current Medication list given to you today.  Labwork: NONE  Testing/Procedures: Your physician has requested that you have an echocardiogram. Echocardiography is a painless test that uses sound waves to create images of your heart. It provides your doctor with information about the size and shape of your heart and how well your heart's chambers and valves are working. This procedure takes approximately one hour. There are no restrictions for this procedure.  Your physician has requested that you have a lexiscan myoview. For further information please visit [email or web address]. Please follow instruction sheet, as given.  Follow-Up: Your physician recommends that you schedule a follow-up appointment in: [place]  If you need a refill on your cardiac medications before your next appointment, please call your pharmacy.

## 2015-07-09 NOTE — Progress Notes (Signed)
Cardiology Office Note   Date:  07/09/2015   ID:  Carla Bell, DOB 30-Sep-1960, MRN [id number]  PCP:  Maren Reamer, MD  Cardiologist:   Skeet Latch, MD   Chief Complaint  Patient presents with  . New Evaluation    f/u ED in March for chest pain--hx of pericardial effusion--referred by Dr. Janne Napoleon  pt c/o occasional stabbing/burning pain in chest; SOB on exertion--COPD, asthma; has gained weight rapidly, feels like swelling      History of Present Illness: Carla Bell is a 55 y.o. female with prior pericardial effusion requiring pericardial window, asthma, alpha 1 anti-trypsin carrier, bowel obstruction, who presents for follow up on chest pain.  Carla Bell was seen in the ED on 3/29 with pleuritic chest pain with tenderness to palpation of the anterior chest.  Cardiac enzymes, CBC, and basic metabolic panel were unremarkable. Symptoms were felt to be unlikely due to ischemia and she was instructed to follow up with her primary care physician.  She was her PCP, Lottie Mussel, MD on 07/03/15  andwas instructed to follow-up with cardiology. Carla Bell reports right-sided chest pain that radiates across her chest and is worse with inspiration.  She is chronically short of breath but thinks that it has been somewhat worse lately. She is concerned because  her symptoms are consistent with when she needed a pericardial window in 11/03/2012.  She received medical care at [facility], [place], [place].  They didn't determine the reason for the pericardial effusion.    She reports gaining a lot of weight.  She gained 20lb in the last 4 months.  She denies any changes in her diet. She does not exercise regularly due to shortness of breath.  She sometimes feels like she is gasping trying to catch her breath.  She has a healthy diet and doesn't eat any meat.  She denies lower extremity edema, orthopnea or PND.  After her hospitalization in 2014 she had a pharmacologic stress test  that was reportedly negative for ischemia.   Carla Bell sometimes feels burning chest discomfort.  This happens more frequently with exertion and is pleuritic in nature.  The episodes last from seconds to minutes and improves with palpitation.  After her hospitalization in 2014 she had a pharmacologic stress test that was reportedly negative for ischemia.   She sometimes feels like her heart is skipping a beat. This happens 1-2 times per day and lasts for 20-30 seconds.  She gets lightheaded but Her shortness of breath and palpitations are not worse when she has the palpitations.  While living in [place], Carla Bell was hospitalized frequently for her pulmonary disease and required hospitalizations up two tweeks at a time.   she was never intubated.   Past Medical History  Diagnosis Date  . Asthma   . COPD (chronic obstructive pulmonary disease) ([place])   . Cancer ([place])   . Pericardial effusion 07/09/2015  . Pleuritic chest pain 07/09/2015    History reviewed. No pertinent past surgical history.   Current Outpatient Prescriptions  Medication Sig Dispense Refill  . albuterol (PROVENTIL HFA;VENTOLIN HFA) 108 (90 Base) MCG/ACT inhaler Inhale 1 puff into the lungs every 6 (six) hours as needed for wheezing or shortness of breath. 1 Inhaler 3  . beclomethasone (QVAR) 40 MCG/ACT inhaler Inhale 2 puffs into the lungs 2 (two) times daily as needed (sob). 1 Inhaler 12  . fluticasone (FLONASE) 50 MCG/ACT nasal spray Place 1 spray into both nostrils daily. 16 g  3  . ibuprofen (ADVIL,MOTRIN) 400 MG tablet Take 1 tablet (400 mg total) by mouth every 6 (six) hours as needed. 10 tablet 0  . ipratropium-albuterol (DUONEB) 0.5-2.5 (3) MG/3ML SOLN Take 3 mLs by nebulization every 6 (six) hours as needed (sob/doe). 360 mL 1  . psyllium (METAMUCIL SMOOTH TEXTURE) 28 % packet Take 1 packet by mouth 2 (two) times daily as needed. 30 packet 2  . traZODone (DESYREL) 150 MG tablet Take 150 mg by mouth at bedtime.      No current facility-administered medications for this visit.    Allergies:   Review of patient's allergies indicates no known allergies.    Social History:  The patient  reports that she has quit smoking. She does not have any smokeless tobacco history on file. She reports that she does not drink alcohol.   Family History:  The patient's family history includes Alpha-1 antitrypsin deficiency in her mother.    ROS:  Please see the history of present illness.   Otherwise, review of systems are positive for none.   All other systems are reviewed and negative.    PHYSICAL EXAM: VS:  BP 121/75 mmHg  Pulse 73  Wt 80.559 kg (177 lb 9.6 oz) , BMI There is no height on file to calculate BMI. GENERAL:  Well appearing HEENT:  Pupils equal round and reactive, fundi not visualized, oral mucosa unremarkable NECK:  No jugular venous distention, waveform within normal limits, carotid upstroke brisk and symmetric, no bruits, no thyromegaly LYMPHATICS:  No cervical adenopathy LUNGS:  Clear to auscultation bilaterally HEART:  RRR.  PMI not displaced or sustained,S1 and S2 within normal limits, no S3, no S4, no clicks, no rubs, no murmurs ABD:  Flat, positive bowel sounds normal in frequency in pitch, no bruits, no rebound, no guarding, no midline pulsatile mass, no hepatomegaly, no splenomegaly EXT:  2 plus pulses throughout, no edema, no cyanosis no clubbing SKIN:  No rashes no nodules NEURO:  Cranial nerves II through XII grossly intact, motor grossly intact throughout PSYCH:  Cognitively intact, oriented to person place and time  EKG:  EKG is not ordered today. The ekg ordered 4/2/17demonstrates sinus rhythm rate 69 bpm.   Recent Labs: 06/12/2015: BUN 14; Creatinine, Ser 0.95; Hemoglobin 12.8; Platelets 265; Potassium 4.1; Sodium 138 07/03/2015: TSH 0.90    Lipid Panel No results found for: CHOL, TRIG, HDL, CHOLHDL, VLDL, LDLCALC, LDLDIRECT    Wt Readings from Last 3 Encounters:   07/09/15 80.559 kg (177 lb 9.6 oz)  07/03/15 79.833 kg (176 lb)  06/14/15 76.658 kg (169 lb)      ASSESSMENT AND PLAN:  # Pericardial effusion:  # Shortness of breath: Carla Bell does not have any evidence of heart failure or tamponade not on exam. She has no lower limb edema in her JVD is not elevated. We will obtain a transthoracic echo to ensure that she does not have reaccumulation of her pericardial effusion. The etiology of her effusion is unknown. Presumably, post-viral cardiomyopathy.  It seems more likely that her shortness of breath is attributable to her alpha-1 antitrypsin and asthma. She has a consultation with pulmonology pending.  # CP: Carla Bell has symptoms of atypical chest pain. We will obtain a Lexiscan Cardiolite to evaluate for ischemia. She does not think she will be able to walk on a treadmill due to her respiratory status.   Current medicines are reviewed at length with the patient today.  The patient does not have concerns regarding  medicines.  The following changes have been made:  no change  Labs/ tests ordered today include:   Orders Placed This Encounter  Procedures  . Myocardial Perfusion Imaging  . ECHOCARDIOGRAM COMPLETE     Disposition:   FU with Javeah Loeza C. Oval Linsey, MD, [facility] in 3 months   This note was written with the assistance of speech recognition software.  Please excuse any transcriptional errors.  Signed, Sidhant Helderman C. Oval Linsey, MD, [facility]  07/09/2015 5:30 PM    [place]

## 2015-07-12 ENCOUNTER — Ambulatory Visit (INDEPENDENT_AMBULATORY_CARE_PROVIDER_SITE_OTHER): Payer: Medicaid Other | Admitting: Internal Medicine

## 2015-07-12 ENCOUNTER — Encounter: Payer: Self-pay | Admitting: Internal Medicine

## 2015-07-12 VITALS — BP 126/78 | HR 78 | Ht 69.0 in | Wt 174.0 lb

## 2015-07-12 DIAGNOSIS — R0600 Dyspnea, unspecified: Secondary | ICD-10-CM

## 2015-07-12 DIAGNOSIS — E8801 Alpha-1-antitrypsin deficiency: Secondary | ICD-10-CM | POA: Diagnosis not present

## 2015-07-12 DIAGNOSIS — Z148 Genetic carrier of other disease: Secondary | ICD-10-CM

## 2015-07-12 DIAGNOSIS — J449 Chronic obstructive pulmonary disease, unspecified: Secondary | ICD-10-CM

## 2015-07-12 MED ORDER — TIOTROPIUM BROMIDE-OLODATEROL 2.5-2.5 MCG/ACT IN AERS: 2.0000 | INHALATION_SPRAY | Freq: Every day | RESPIRATORY_TRACT | Status: DC

## 2015-07-12 MED ORDER — FAMOTIDINE 20 MG PO TABS: ORAL_TABLET | ORAL | Status: DC

## 2015-07-12 MED ORDER — PANTOPRAZOLE SODIUM 40 MG PO TBEC: 40.0000 mg | DELAYED_RELEASE_TABLET | Freq: Every day | ORAL | Status: DC

## 2015-07-12 NOTE — Patient Instructions (Addendum)
Plan A = Automatic =   Stiolto 2 puffs each am                                        Qvar 40 Take 2 puffs first thing in am and then another 2 puffs about 12 hours later                                       Pantoprazole (protonix) 40 mg   Take  30-60 min before first meal of the day and Pepcid (famotidine)  20 mg one @  bedtime until return   Plan B = Backup Only use your albuterol as a rescue medication to be used if you can't catch your breath by resting or doing a relaxed purse lip breathing pattern.  - The less you use it, the better it will work when you need it. - Ok to use the inhaler up to 2 puffs  every 4 hours if you must but call for appointment if use goes up over your usual need - Don't leave home without it !!  (think of it like the spare tire for your car)   Plan C = Crisis/Contingency  - only use your albuterol/ipatroprium nebulizer if you first try Plan B and it fails to help > ok to use the nebulizer up to every 4 hours but if start needing it regularly call for immediate appointment  We will arrange for you To have a new nebulizer   Please remember to go to the   Lab  department downstairs for your tests - we will call you with the results when they are available.     Please schedule a follow up office visit in 2  weeks, sooner if needed with PFT's at [facility] if not available here

## 2015-07-12 NOTE — Assessment & Plan Note (Signed)
Symptoms are markedly disproportionate to objective findings and not clear this is a lung problem but pt does appear to have difficult airway management issues. DDX of  difficult airways management almost all start with A and  include Adherence, Ace Inhibitors, Acid Reflux, Active Sinus Disease, Alpha 1 Antitripsin deficiency, Anxiety masquerading as Airways dz,  ABPA,  Allergy(esp in young), Aspiration (esp in elderly), Adverse effects of meds,  Active smokers, A bunch of PE's (a small clot burden can't cause this syndrome unless there is already severe underlying pulm or vascular dz with poor reserve) plus two Bs  = Bronchiectasis and Beta blocker use..and one C= CHF  Adherence is always the initial "prime suspect" and is a multilayered concern that requires a "trust but verify" approach in every patient - starting with knowing how to use medications, especially inhalers, correctly, keeping up with refills and understanding the fundamental difference between maintenance and prns vs those medications only taken for a very short course and then stopped and not refilled.  - very confused between maint vs prns  - The proper method of use, as well as anticipated side effects, of a metered-dose inhaler are discussed and demonstrated to the patient. Improved effectiveness after extensive coaching during this visit to a level of approximately 75 % from a baseline of 50 % > try stiolto 2 each am  Alpha One AT >  Needs level for baseline, consideration for replacement therapy if low level and progression of airflow obst on rx   ? Acid (or non-acid) GERD > always difficult to exclude as up to 75% of pts in some series report no assoc GI/ Heartburn symptoms and she has atypical cp hx > rec max (24h)  acid suppression and diet restrictions/ reviewed and instructions given in writing.   ? Allergy/asthmatic component >  Leave on qvar 40 x 2 bid for now and consider trial of the 80 on return p more formal pfts     Total time devoted to counseling  = 35/44m review case with pt/ discussion of options/alternatives/ personally creating in presence of pt  then going over specific  Instructions directly with the pt including how to use all of the meds but in particular covering each new medication in detail (see avs)

## 2015-07-12 NOTE — Assessment & Plan Note (Signed)
07/12/2015   Walked RA x one lap @ 185 stopped due to  Sob/ sats 100% slow pace

## 2015-07-12 NOTE — Progress Notes (Signed)
Subjective:     Patient ID: Carla [facility], female   DOB: 1960-08-27      MRN: [id number]  HPI  51 yowf quit smoking 1981 with no symptoms at all when  identified as Carla one AT [facility] then while living in [place] around 2010 pattern of bad colds >chest > admit 3-4 times per year dx as copd/asthma with pericardial effusion in 2015 required window no dx > no admits since moved to [place] in 02/2015 referred to pulmonary clinic 07/12/2015 by Dr Francis Dowse   07/12/2015 1st [facility] office visit/ Arlee Bossard   Chief Complaint  Patient presents with  . Pulmonary Consult    Referred by Dr. Thereasa Solo. Pt recently moved to [place] from [place]. She has hx of Carla 1 def, COPD and Asthma. She has never smoked. She states her chest "is always tight and it's always hard to breathe". She states she gets SOB with exertion such as taking out the garbage and trying to talk and walk at the same time.    last felt "fine"  immediately p d/c from [facility] around 2016 spring then gradual return of symptoms and saba dep w/in a week or two of d/c Able to walk across parking lot and stops half way across the parking lot and half way down the aisles  Inhalers don't really help(points to qvar in particular and says has to take it multiple times to "feel it" Sleeps at 45 degrees o/w feels smothering/immediately takes duoneb on rising but doesn't wake up prematurely   Breo works the best/ no resp to symb/qvar/advair/ note talking makes her as sob as walking.  To er 06/12/15 with atypical cp > cards w/u in progress   No obvious day to day or daytime variability or assoc excess/ purulent sputum or mucus plugs or hemoptysis or cp or chest tightness, subjective wheeze or overt sinus or hb symptoms. No unusual exp hx or h/o childhood pna/ asthma or knowledge of premature birth.  Also denies any obvious fluctuation of symptoms with weather or environmental changes or other aggravating or alleviating factors except as outlined above    Current Medications, Allergies, Complete Past Medical History, Past Surgical History, Family History, and Social History were reviewed in [employer] record.  ROS  The following are not active complaints unless bolded sore throat, dysphagia, dental problems, itching, sneezing,  nasal congestion or excess/ purulent secretions, ear ache,   fever, chills, sweats, unintended wt loss, classically pleuritic or exertional cp,  orthopnea pnd or leg swelling, presyncope, palpitations, abdominal pain, anorexia, nausea, vomiting, diarrhea  or change in bowel or bladder habits, change in stools or urine, dysuria,hematuria,  rash, arthralgias, visual complaints, headache, numbness, weakness or ataxia or problems with walking or coordination,  change in mood/affect or memory.           Review of Systems     Objective:   Physical Exam   Hoarse wf extremely anxious / sigh breaths and voice fatigue apparent   Wt Readings from Last 3 Encounters:  07/12/15 174 lb (78.926 kg)  07/09/15 177 lb 9.6 oz (80.559 kg)  07/03/15 176 lb (79.833 kg)    Vital signs reviewed   HEENT: poor dentition/ nl turbinates, and oropharynx. Nl external ear canals without cough reflex   NECK :  without JVD/Nodes/TM/ nl carotid upstrokes bilaterally   LUNGS: no acc muscle use,  Nl contour chest which is clear to A and P bilaterally without cough on insp or exp maneuvers  CV:  RRR  no s3 or murmur or increase in P2, no edema   ABD:  soft and nontender with nl inspiratory excursion in the supine position. No bruits or organomegaly, bowel sounds nl  MS:  Nl gait/ ext warm without deformities, calf tenderness, cyanosis or clubbing No obvious joint restrictions   SKIN: warm and dry without lesions    NEURO:  alert, approp, nl sensorium with  no motor deficits     I personally reviewed images and agree with radiology impression as follows:  CXR:  06/12/15 Cardiomediastinal silhouette is  unremarkable. No infiltrate or pleural effusion. No pulmonary edema. Bony thorax is unremarkable     Labs ordered/ reviewed:     Chemistry      Component Value Date/Time   NA 138 06/12/2015 1837   K 4.1 06/12/2015 1837   CL 103 06/12/2015 1837   CO2 26 06/12/2015 1837   BUN 14 06/12/2015 1837   CREATININE 0.95 06/12/2015 1837      Component Value Date/Time   CALCIUM 9.7 06/12/2015 1837        Lab Results  Component Value Date   WBC 7.1 06/12/2015   HGB 12.8 06/12/2015   HCT 38.0 06/12/2015   MCV 90.3 06/12/2015   PLT 265 06/12/2015        Lab Results  Component Value Date   TSH 0.90 07/03/2015              Assessment:

## 2015-07-12 NOTE — Assessment & Plan Note (Signed)
Repeat levels/phenotype ordered for baseline purposes

## 2015-07-15 ENCOUNTER — Encounter: Payer: Self-pay | Admitting: Internal Medicine

## 2015-07-15 ENCOUNTER — Other Ambulatory Visit (INDEPENDENT_AMBULATORY_CARE_PROVIDER_SITE_OTHER): Payer: Medicaid Other

## 2015-07-15 DIAGNOSIS — J449 Chronic obstructive pulmonary disease, unspecified: Secondary | ICD-10-CM

## 2015-07-15 DIAGNOSIS — E8801 Alpha-1-antitrypsin deficiency: Secondary | ICD-10-CM | POA: Diagnosis not present

## 2015-07-15 DIAGNOSIS — R937 Abnormal findings on diagnostic imaging of other parts of musculoskeletal system: Secondary | ICD-10-CM | POA: Diagnosis not present

## 2015-07-15 DIAGNOSIS — Z148 Genetic carrier of other disease: Secondary | ICD-10-CM

## 2015-07-15 DIAGNOSIS — Z9049 Acquired absence of other specified parts of digestive tract: Secondary | ICD-10-CM | POA: Insufficient documentation

## 2015-07-15 DIAGNOSIS — Z8719 Personal history of other diseases of the digestive system: Secondary | ICD-10-CM | POA: Insufficient documentation

## 2015-07-15 LAB — LIPID PANEL
CHOL/HDL RATIO: 2
Cholesterol: 173 mg/dL (ref 0–200)
HDL: 69.5 mg/dL (ref >39.00)
LDL CALC: 76 mg/dL (ref 0–99)
NONHDL: 103.16
TRIGLYCERIDES: 136 mg/dL (ref 0.0–149.0)
VLDL: 27.2 mg/dL (ref 0.0–40.0)

## 2015-07-16 ENCOUNTER — Telehealth: Payer: Self-pay | Admitting: *Deleted

## 2015-07-16 NOTE — Telephone Encounter (Signed)
-----   Message from Maren Reamer, MD sent at 07/08/2015 12:59 PM EDT ----- Please call w/ results.  Thyroid is normal.  No DM per labs as well. thanks

## 2015-07-16 NOTE — Telephone Encounter (Signed)
Patient verified DOB Patient is aware of thyroid being normal DM not being a concern. Patient has lipid completed at another office which showed unremarkable results. Patient had no further questions at this time.

## 2015-07-17 LAB — ALPHA-1-ANTITRYPSIN: A1 ANTITRYPSIN SER: 85 mg/dL (ref 83–199)

## 2015-07-23 LAB — ALPHA-1 ANTITRYPSIN PHENOTYPE: A-1 Antitrypsin: 85 mg/dL (ref 83–199)

## 2015-07-24 NOTE — Progress Notes (Signed)
Quick Note:  LMTCB ______ 

## 2015-07-26 ENCOUNTER — Encounter: Payer: Self-pay | Admitting: Internal Medicine

## 2015-07-26 ENCOUNTER — Ambulatory Visit (HOSPITAL_COMMUNITY)
Admission: RE | Admit: 2015-07-26 | Discharge: 2015-07-26 | Disposition: A | Discharge status: Home / Self Care | Payer: Medicaid Other | Source: Ambulatory Visit | Attending: Internal Medicine | Admitting: Internal Medicine

## 2015-07-26 ENCOUNTER — Telehealth (HOSPITAL_COMMUNITY): Payer: Self-pay

## 2015-07-26 ENCOUNTER — Ambulatory Visit (INDEPENDENT_AMBULATORY_CARE_PROVIDER_SITE_OTHER): Payer: Medicaid Other | Admitting: Internal Medicine

## 2015-07-26 VITALS — BP 120/78 | HR 71 | Ht 69.0 in | Wt 177.0 lb

## 2015-07-26 DIAGNOSIS — J449 Chronic obstructive pulmonary disease, unspecified: Secondary | ICD-10-CM

## 2015-07-26 DIAGNOSIS — Z148 Genetic carrier of other disease: Secondary | ICD-10-CM | POA: Diagnosis not present

## 2015-07-26 DIAGNOSIS — Z23 Encounter for immunization: Secondary | ICD-10-CM | POA: Diagnosis not present

## 2015-07-26 DIAGNOSIS — E8801 Alpha-1-antitrypsin deficiency: Secondary | ICD-10-CM | POA: Diagnosis present

## 2015-07-26 LAB — PULMONARY FUNCTION TEST
DL/VA % pred: 86 %
DL/VA: 4.6 ml/min/mmHg/L
DLCO unc % pred: 71 %
DLCO unc: 22.12 ml/min/mmHg
FEF 25-75 Post: 1.8 L/sec
FEF 25-75 Pre: 1.14 L/sec
FEF2575-%Change-Post: 58 %
FEF2575-%PRED-PRE: 40 %
FEF2575-%Pred-Post: 64 %
FEV1-%CHANGE-POST: 15 %
FEV1-%PRED-POST: 70 %
FEV1-%PRED-PRE: 61 %
FEV1-PRE: 1.92 L
FEV1-Post: 2.22 L
FEV1FVC-%Change-Post: 3 %
FEV1FVC-%Pred-Pre: 83 %
FEV6-%Change-Post: 11 %
FEV6-%PRED-POST: 82 %
FEV6-%Pred-Pre: 73 %
FEV6-POST: 3.22 L
FEV6-PRE: 2.88 L
FEV6FVC-%Change-Post: 0 %
FEV6FVC-%PRED-PRE: 101 %
FEV6FVC-%Pred-Post: 102 %
FVC-%Change-Post: 11 %
FVC-%Pred-Post: 80 %
FVC-%Pred-Pre: 72 %
FVC-POST: 3.24 L
FVC-Pre: 2.91 L
POST FEV1/FVC RATIO: 68 %
PRE FEV6/FVC RATIO: 99 %
Post FEV6/FVC ratio: 100 %
Pre FEV1/FVC ratio: 66 %
RV % pred: 117 %
RV: 2.55 L
TLC % PRED: 95 %
TLC: 5.56 L

## 2015-07-26 MED ORDER — ALBUTEROL SULFATE (2.5 MG/3ML) 0.083% IN NEBU
2.5000 mg | INHALATION_SOLUTION | Freq: Once | RESPIRATORY_TRACT | Status: AC
  Administered 2015-07-26: 2.5 mg via RESPIRATORY_TRACT

## 2015-07-26 NOTE — Telephone Encounter (Signed)
Encounter complete. 

## 2015-07-26 NOTE — Progress Notes (Signed)
Quick Note:  Spoke with pt and notified of results per Dr. Wert. Pt verbalized understanding and denied any questions.  ______ 

## 2015-07-26 NOTE — Assessment & Plan Note (Signed)
07/12/15  > try stiolto respimat plus qvar 40 2bid (LAMA/LABA/ICS) - Spirometry 07/12/2015  FEV1 1.42  (47%)  Ratio 65 w/in sev hours of saba   - 07/12/2015 alpha one AT >  MZ/ level 85 (while not having flare)  - PFT's  07/26/2015  FEV1 2.22 (70 % ) ratio 68  p 15 % improvement from saba p no rx prior to study with DLCO  71 % corrects to 86 % for alv volume     - 07/26/2015  After extensive coaching HFA effectiveness =    90% with respimat    Improving with triple combo and no need for rescue unless over does it, and only then saba hfa  If losing ground on approp rx would consider alpha one replacement but normally not needed  Does need to keep up with flu/ pneumo rx > prevnar today  I had an extended discussion with the patient reviewing all relevant studies completed to date and  lasting 15 to 20 minutes of a 25 minute visit    Each maintenance medication was reviewed in detail including most importantly the difference between maintenance and prns and under what circumstances the prns are to be triggered using an action plan format that is not reflected in the computer generated alphabetically organized AVS.    Please see instructions for details which were reviewed in writing and the patient given a copy highlighting the part that I personally wrote and discussed at today's ov.

## 2015-07-26 NOTE — Progress Notes (Signed)
Subjective:     Patient ID: Carla [facility], female   DOB: May 22, 1960      MRN: [id number]    Brief patient profile:  65 yowf quit smoking 1981 with no symptoms at all when  identified as alpha one AT [facility] then while living in [place] around 2010 pattern of bad colds >chest > admit 3-4 times per year dx as copd/asthma with pericardial effusion in 2015 required window no dx > no admits since moved to [place] in 02/2015 referred to pulmonary clinic 07/12/2015 by Dr Francis Dowse and proved to have GOLD II copd 07/26/2015     History of Present Illness  07/12/2015 1st [facility] office visit/ Seddrick Flax   Chief Complaint  Patient presents with  . Pulmonary Consult    Referred by Dr. Thereasa Solo. Pt recently moved to [place] from [place]. She has hx of Alpha 1 def, COPD and Asthma. She has never smoked. She states her chest "is always tight and it's always hard to breathe". She states she gets SOB with exertion such as taking out the garbage and trying to talk and walk at the same time.    last felt "fine"  immediately p d/c from [facility] around 2016 spring then gradual return of symptoms and saba dep w/in a week or two of d/c Able to walk across parking lot and stops half way across the parking lot and half way down the aisles  Inhalers don't really help(points to qvar in particular and says has to take it multiple times to "feel it" Sleeps at 45 degrees o/w feels smothering/immediately takes duoneb on rising but doesn't wake up prematurely   Breo works the best/ no resp to symb/qvar/advair/ note talking makes her as sob as walking.  To er 06/12/15 with atypical cp > cards w/u in progress  rec Plan A = Automatic =   Stiolto 2 puffs each am                                        Qvar 40 Take 2 puffs first thing in am and then another 2 puffs about 12 hours later                                       Pantoprazole (protonix) 40 mg   Take  30-60 min before first meal of the day and Pepcid (famotidine)  20 mg one  @  bedtime until return  Plan B = Backup Only use your albuterol as a rescue medication  Plan C = Crisis/Contingency  - only use your albuterol/ipatroprium nebulizer if you first try Plan B       07/26/2015  f/u ov/Rhiley Solem re: COPD GOLDII/ AB rx qvar 402bid/stiolto first thing in am /gerd rx  Chief Complaint  Patient presents with  . Follow-up    Pt states her breathing has improved some. She coughs some after using the Stiolto- "clear, thick, paste". She has not had to use proventil.   only uses proventil if overdoes it/ rare duoneb now  Hoarseness and cough much better overall  Doe = MMRC2 = can't walk a nl pace on a flat grade s sob but does fine slow and flat eg   shopping    No obvious day to day or daytime variability or assoc  purulent sputum  or mucus plugs or hemoptysis or cp or chest tightness, subjective wheeze or overt sinus or hb symptoms. No unusual exp hx or h/o childhood pna/ asthma or knowledge of premature birth.  Also denies any obvious fluctuation of symptoms with weather or environmental changes or other aggravating or alleviating factors except as outlined above   Current Medications, Allergies, Complete Past Medical History, Past Surgical History, Family History, and Social History were reviewed in [employer] record.  ROS  The following are not active complaints unless bolded sore throat, dysphagia, dental problems, itching, sneezing,  nasal congestion or excess/ purulent secretions, ear ache,   fever, chills, sweats, unintended wt loss, classically pleuritic or exertional cp,  orthopnea pnd or leg swelling, presyncope, palpitations, abdominal pain, anorexia, nausea, vomiting, diarrhea  or change in bowel or bladder habits, change in stools or urine, dysuria,hematuria,  rash, arthralgias, visual complaints, headache, numbness, weakness or ataxia or problems with walking or coordination,  change in mood/affect or memory.               Objective:   Physical Exam   Hoarse wf  nad  07/26/2015         177   07/12/15 174 lb (78.926 kg)  07/09/15 177 lb 9.6 oz (80.559 kg)  07/03/15 176 lb (79.833 kg)    Vital signs reviewed   HEENT: poor dentition/ nl turbinates, and oropharynx. Nl external ear canals without cough reflex   NECK :  without JVD/Nodes/TM/ nl carotid upstrokes bilaterally   LUNGS: no acc muscle use,  Nl contour chest which is clear to A and P bilaterally without cough on insp or exp maneuvers   CV:  RRR  no s3 or murmur or increase in P2, no edema   ABD:  soft and nontender with nl inspiratory excursion in the supine position. No bruits or organomegaly, bowel sounds nl  MS:  Nl gait/ ext warm without deformities, calf tenderness, cyanosis or clubbing No obvious joint restrictions   SKIN: warm and dry without lesions    NEURO:  alert, approp, nl sensorium with  no motor deficits     I personally reviewed images and agree with radiology impression as follows:  CXR:  06/12/15 Cardiomediastinal silhouette is unremarkable. No infiltrate or pleural effusion. No pulmonary edema. Bony thorax is unremarkable     Labs ordered/ reviewed:     Chemistry      Component Value Date/Time   NA 138 06/12/2015 1837   K 4.1 06/12/2015 1837   CL 103 06/12/2015 1837   CO2 26 06/12/2015 1837   BUN 14 06/12/2015 1837   CREATININE 0.95 06/12/2015 1837      Component Value Date/Time   CALCIUM 9.7 06/12/2015 1837        Lab Results  Component Value Date   WBC 7.1 06/12/2015   HGB 12.8 06/12/2015   HCT 38.0 06/12/2015   MCV 90.3 06/12/2015   PLT 265 06/12/2015        Lab Results  Component Value Date   TSH 0.90 07/03/2015              Assessment:

## 2015-07-26 NOTE — Patient Instructions (Addendum)
Plan A = Automatic =   Stiolto 2 puffs each am                                        Qvar 40 Take 2 puffs first thing in am and then another 2 puffs about 12 hours later                                       Pantoprazole (protonix) 40 mg   Take  30-60 min before first meal of the day and Pepcid (famotidine)  20 mg one @  bedtime until return   Plan B = Backup Only use your albuterol as a rescue medication to be used if you can't catch your breath by resting or doing a relaxed purse lip breathing pattern.  - The less you use it, the better it will work when you need it. - Ok to use the inhaler up to 2 puffs  every 4 hours if you must but call for appointment if use goes up over your usual need - Don't leave home without it !!  (think of it like the spare tire for your car)   Plan C = Crisis/Contingency  - only use your albuterol/ipatroprium nebulizer if you first try Plan B and it fails to help > ok to use the nebulizer up to every 4 hours but if start needing it regularly call for immediate appointment   Prevnar 13 today   Pace yourself better so you don't run out of air    Please schedule a follow up visit in 6  months but call sooner if needed

## 2015-07-31 ENCOUNTER — Ambulatory Visit (HOSPITAL_COMMUNITY)
Admission: RE | Admit: 2015-07-31 | Discharge: 2015-07-31 | Disposition: A | Discharge status: Home / Self Care | Payer: Medicaid Other | Source: Ambulatory Visit | Attending: Cardiovascular Disease | Admitting: Cardiovascular Disease

## 2015-07-31 DIAGNOSIS — R5383 Other fatigue: Secondary | ICD-10-CM | POA: Diagnosis not present

## 2015-07-31 DIAGNOSIS — E8801 Alpha-1-antitrypsin deficiency: Secondary | ICD-10-CM | POA: Diagnosis not present

## 2015-07-31 DIAGNOSIS — R079 Chest pain, unspecified: Secondary | ICD-10-CM | POA: Insufficient documentation

## 2015-07-31 DIAGNOSIS — R42 Dizziness and giddiness: Secondary | ICD-10-CM | POA: Diagnosis not present

## 2015-07-31 DIAGNOSIS — R0609 Other forms of dyspnea: Secondary | ICD-10-CM | POA: Insufficient documentation

## 2015-07-31 DIAGNOSIS — J449 Chronic obstructive pulmonary disease, unspecified: Secondary | ICD-10-CM | POA: Diagnosis not present

## 2015-07-31 DIAGNOSIS — Z87891 Personal history of nicotine dependence: Secondary | ICD-10-CM | POA: Diagnosis not present

## 2015-07-31 LAB — MYOCARDIAL PERFUSION IMAGING
CHL CUP NUCLEAR SDS: 3
CHL CUP RESTING HR STRESS: 61 {beats}/min
CHL CUP STRESS STAGE 2 SPEED: 0 mph
CHL CUP STRESS STAGE 3 GRADE: 0 %
CHL CUP STRESS STAGE 3 HR: 91 {beats}/min
CHL CUP STRESS STAGE 3 SPEED: 0 mph
CHL CUP STRESS STAGE 4 GRADE: 0 %
CSEPEW: 1 METS
CSEPPHR: 91 {beats}/min
CSEPPMHR: 55 %
LVDIAVOL: 109 mL (ref 46–106)
LVSYSVOL: 51 mL
SRS: 2
SSS: 5
Stage 1 DBP: 78 mmHg
Stage 1 Grade: 0 %
Stage 1 HR: 57 {beats}/min
Stage 1 SBP: 136 mmHg
Stage 1 Speed: 0 mph
Stage 2 Grade: 0 %
Stage 2 HR: 57 {beats}/min
Stage 4 DBP: 73 mmHg
Stage 4 HR: 60 {beats}/min
Stage 4 SBP: 136 mmHg
Stage 4 Speed: 0 mph
TID: 1.24

## 2015-07-31 MED ORDER — AMINOPHYLLINE 25 MG/ML IV SOLN
75.0000 mg | Freq: Once | INTRAVENOUS | Status: AC
  Administered 2015-07-31: 75 mg via INTRAVENOUS

## 2015-07-31 MED ORDER — TECHNETIUM TC 99M TETROFOSMIN IV KIT
31.0000 | PACK | Freq: Once | INTRAVENOUS | Status: AC | PRN
  Administered 2015-07-31: 31 via INTRAVENOUS
  Filled 2015-07-31: qty 31

## 2015-07-31 MED ORDER — TECHNETIUM TC 99M TETROFOSMIN IV KIT
10.3000 | PACK | Freq: Once | INTRAVENOUS | Status: AC | PRN
  Administered 2015-07-31: 10 via INTRAVENOUS
  Filled 2015-07-31: qty 10

## 2015-07-31 MED ORDER — REGADENOSON 0.4 MG/5ML IV SOLN
0.4000 mg | Freq: Once | INTRAVENOUS | Status: AC
  Administered 2015-07-31: 0.4 mg via INTRAVENOUS

## 2015-08-01 ENCOUNTER — Ambulatory Visit: Payer: Medicaid Other

## 2015-08-01 ENCOUNTER — Other Ambulatory Visit (HOSPITAL_COMMUNITY): Payer: Medicaid Other

## 2015-08-05 ENCOUNTER — Ambulatory Visit (HOSPITAL_COMMUNITY): Payer: Medicaid Other | Attending: Cardiology

## 2015-09-02 ENCOUNTER — Other Ambulatory Visit (HOSPITAL_COMMUNITY): Payer: Medicaid Other

## 2015-09-10 ENCOUNTER — Telehealth: Payer: Self-pay | Admitting: Internal Medicine

## 2015-09-10 NOTE — Telephone Encounter (Signed)
Called spoke with pt. Pt is requesting samples of stiolto. I explained to her that I do have samples and that they have been placed at the front office for pick up. She voiced understanding and had no further questions. Nothing further needed.

## 2015-09-11 ENCOUNTER — Telehealth: Payer: Self-pay | Admitting: *Deleted

## 2015-09-11 NOTE — Telephone Encounter (Signed)
ATC pt #--NA and no VM Called mobile # and is no longer in service

## 2015-09-11 NOTE — Telephone Encounter (Addendum)
Stiolto is not covered by insurance.  Number to call for PA is [phone] Please advise Dr Melvyn Novas if you would like to initiate a PA on Stiolto or try alternatives? Thanks.   Patient Instructions     Plan A = Automatic = Stiolto 2 puffs each am   Qvar 40 Take 2 puffs first thing in am and then another 2 puffs about 12 hours later  Pantoprazole (protonix) 40 mg Take 30-60 min before first meal of the day and Pepcid (famotidine) 20 mg one @ bedtime until return   Plan B = Backup Only use your albuterol as a rescue medication to be used if you can't catch your breath by resting or doing a relaxed purse lip breathing pattern.  - The less you use it, the better it will work when you need it. - Ok to use the inhaler up to 2 puffs every 4 hours if you must but call for appointment if use goes up over your usual need - Don't leave home without it !! (think of it like the spare tire for your car)   Plan C = Crisis/Contingency  - only use your albuterol/ipatroprium nebulizer if you first try Plan B and it fails to help > ok to use the nebulizer up to every 4 hours but if start needing it regularly call for immediate appointment  Prevnar 13 today   Pace yourself better so you don't run out of air   Please schedule a follow up visit in 6 months but call sooner if needed

## 2015-09-11 NOTE — Telephone Encounter (Signed)
Samples x 4 weeks then ov with formulary

## 2015-09-13 NOTE — Telephone Encounter (Signed)
ATC pt, n/a and no vm.

## 2015-09-26 NOTE — Telephone Encounter (Signed)
Attempted to contact pt. No answer, no option to leave a message. Will try back.  

## 2015-10-01 ENCOUNTER — Telehealth: Payer: Self-pay | Admitting: *Deleted

## 2015-10-01 ENCOUNTER — Encounter: Payer: Self-pay | Admitting: *Deleted

## 2015-10-01 NOTE — Telephone Encounter (Signed)
Letter mailed

## 2015-10-01 NOTE — Telephone Encounter (Signed)
Will forward to Dr Hopkins so she will be aware ?

## 2015-10-01 NOTE — Telephone Encounter (Signed)
10/01/15 This patient has cancel and no-showed for her echo three times,5/18 5/22 and 09/12/2015.

## 2015-10-09 ENCOUNTER — Ambulatory Visit: Payer: Medicaid Other | Admitting: Cardiovascular Disease

## 2015-10-09 ENCOUNTER — Telehealth: Payer: Self-pay | Admitting: Internal Medicine

## 2015-10-09 NOTE — Progress Notes (Deleted)
Cardiology Office Note   Date:  10/09/2015   ID:  Carla Bell, DOB [birth year], MRN [id number]  PCP:  Carla Reamer, MD  Cardiologist:   Carla Latch, MD   No chief complaint on file.    History of Present Illness: Carla Bell is a 55 y.o. 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  Ms. Carla Bell was first seen 06/2015 with pleuritic chest pain and tenderness to palpation.  She was concerned because  her symptoms are consistent with when she needed a pericardial window in 11/03/2012.  She received medical cShe reports having a stress test in 2014 that was negative for ischemia. She was referred for a [facility] 5/17 that was negative for ischemia.  She was referred for an echo but never completed it.   F/u weight gain  She reports gaining a lot of weight.  She gained 20lb in the last 4 months.  She denies any changes in her diet. She does not exercise regularly due to shortness of breath.  She sometimes feels like she is gasping trying to catch her breath.  She has a healthy diet and doesn't eat any meat.  She denies lower extremity edema, orthopnea or PND.  After her hospitalization in 2014 she had a pharmacologic stress test that was reportedly negative for ischemia.     Past Medical History:  Diagnosis Date  . Asthma   . Cancer ([place])   . COPD (chronic obstructive pulmonary disease) ([place])   . Pericardial effusion 07/09/2015  . Pleuritic chest pain 07/09/2015    No past surgical history on file.   Current Outpatient Prescriptions  Medication Sig Dispense Refill  . albuterol (PROVENTIL HFA;VENTOLIN HFA) 108 (90 Base) MCG/ACT inhaler Inhale 1 puff into the lungs every 6 (six) hours as needed for wheezing or shortness of breath. 1 Inhaler 3  . beclomethasone (QVAR) 40 MCG/ACT inhaler Inhale 2 puffs into the lungs 2 (two) times daily.    . famotidine  (PEPCID) 20 MG tablet One at bedtime 30 tablet 2  . fluticasone (FLONASE) 50 MCG/ACT nasal spray Place 1 spray into both nostrils daily. 16 g 3  . ipratropium-albuterol (DUONEB) 0.5-2.5 (3) MG/3ML SOLN Take 3 mLs by nebulization every 6 (six) hours as needed (sob/doe). 360 mL 1  . pantoprazole (PROTONIX) 40 MG tablet Take 1 tablet (40 mg total) by mouth daily. Take 30-60 min before first meal of the day 30 tablet 2  . psyllium (METAMUCIL SMOOTH TEXTURE) 28 % packet Take 1 packet by mouth 2 (two) times daily as needed. 30 packet 2  . Tiotropium Bromide-Olodaterol (STIOLTO RESPIMAT) 2.5-2.5 MCG/ACT AERS Inhale 2 puffs into the lungs daily. (Patient not taking: Reported on 07/26/2015) 1 Inhaler 11  . traZODone (DESYREL) 150 MG tablet Take 150 mg by mouth at bedtime.     No current facility-administered medications for this visit.     Allergies:   Review of patient's allergies indicates no known allergies.    Social History:  The patient  reports that she quit smoking about 36 years ago. Her smoking use included Cigarettes. She has a 0.50 pack-year smoking history. She has never used smokeless tobacco. She reports that she does not drink alcohol or use drugs.   Family History:  The patient's family history includes Alpha-1 antitrypsin deficiency in her daughter and mother.    ROS:  Please see the history of present illness.  Otherwise, review of systems are positive for none.   All other systems are reviewed and negative.    PHYSICAL EXAM: VS:  There were no vitals taken for this visit. , BMI There is no height or weight on file to calculate BMI. GENERAL:  Well appearing HEENT:  Pupils equal round and reactive, fundi not visualized, oral mucosa unremarkable NECK:  No jugular venous distention, waveform within normal limits, carotid upstroke brisk and symmetric, no bruits, no thyromegaly LYMPHATICS:  No cervical adenopathy LUNGS:  Clear to auscultation bilaterally HEART:  RRR.  PMI not  displaced or sustained,S1 and S2 within normal limits, no S3, no S4, no clicks, no rubs, no murmurs ABD:  Flat, positive bowel sounds normal in frequency in pitch, no bruits, no rebound, no guarding, no midline pulsatile mass, no hepatomegaly, no splenomegaly EXT:  2 plus pulses throughout, no edema, no cyanosis no clubbing SKIN:  No rashes no nodules NEURO:  Cranial nerves II through XII grossly intact, motor grossly intact throughout PSYCH:  Cognitively intact, oriented to person place and time  EKG:  EKG is not ordered today. The ekg ordered 4/2/17demonstrates sinus rhythm rate 69 bpm.   Lexiscan Myoview 07/31/15: No ischemia.  LVEF 53%.   Recent Labs: 06/12/2015: BUN 14; Creatinine, Ser 0.95; Hemoglobin 12.8; Platelets 265; Potassium 4.1; Sodium 138 07/03/2015: TSH 0.90    Lipid Panel    Component Value Date/Time   CHOL 173 07/15/2015 1316   TRIG 136.0 07/15/2015 1316   HDL 69.50 07/15/2015 1316   CHOLHDL 2 07/15/2015 1316   VLDL 27.2 07/15/2015 1316   LDLCALC 76 07/15/2015 1316      Wt Readings from Last 3 Encounters:  07/31/15 177 lb (80.3 kg)  07/26/15 177 lb (80.3 kg)  07/12/15 174 lb (78.9 kg)      ASSESSMENT AND PLAN:  # Pericardial effusion:  # Shortness of breath: Ms. Carla Bell does not have any evidence of heart failure or tamponade not on exam. She has no lower limb edema in her JVD is not elevated. We will obtain a transthoracic echo to ensure that she does not have reaccumulation of her pericardial effusion. The etiology of her effusion is unknown. Presumably, post-viral cardiomyopathy.  It seems more likely that her shortness of breath is attributable to her alpha-1 antitrypsin and asthma. She has a consultation with pulmonology pending.  # CP: Ms. Carla Bell has symptoms of atypical chest pain. We will obtain a Lexiscan Cardiolite to evaluate for ischemia. She does not think she will be able to walk on a treadmill due to her respiratory status.   Current medicines are  reviewed at length with the patient today.  The patient does not have concerns regarding medicines.  The following changes have been made:  no change  Labs/ tests ordered today include:   No orders of the defined types were placed in this encounter.    Disposition:   FU with Keymora Grillot C. Oval Linsey, MD, [facility] in 3 months   This note was written with the assistance of speech recognition software.  Please excuse any transcriptional errors.  Signed, Kinzy Weyers C. Oval Linsey, MD, [facility]  10/09/2015 6:18 AM    [place] NA

## 2015-10-09 NOTE — Telephone Encounter (Signed)
Called and spoke with pt and she is aware of MW recs.  appt has been scheduled for her to see MW on 8/11.  Pt will come by tomorrow to pick up 2 other samples of the stiolto.  Nothing further is needed.

## 2015-10-10 ENCOUNTER — Encounter: Payer: Self-pay | Admitting: *Deleted

## 2015-10-25 ENCOUNTER — Encounter: Payer: Self-pay | Admitting: Internal Medicine

## 2015-10-25 ENCOUNTER — Ambulatory Visit (INDEPENDENT_AMBULATORY_CARE_PROVIDER_SITE_OTHER): Payer: Medicaid Other | Admitting: Internal Medicine

## 2015-10-25 VITALS — BP 130/82 | HR 66 | Ht 69.0 in | Wt 183.0 lb

## 2015-10-25 DIAGNOSIS — J449 Chronic obstructive pulmonary disease, unspecified: Secondary | ICD-10-CM

## 2015-10-25 MED ORDER — BECLOMETHASONE DIPROPIONATE 80 MCG/ACT IN AERS: INHALATION_SPRAY | RESPIRATORY_TRACT | 11 refills | Status: DC

## 2015-10-25 MED ORDER — GLYCOPYRROLATE-FORMOTEROL 9-4.8 MCG/ACT IN AERO: 2.0000 | INHALATION_SPRAY | Freq: Two times a day (BID) | RESPIRATORY_TRACT | 0 refills | Status: DC

## 2015-10-25 MED ORDER — PREDNISONE 10 MG PO TABS: ORAL_TABLET | ORAL | 0 refills | Status: DC

## 2015-10-25 MED ORDER — AZITHROMYCIN 250 MG PO TABS: ORAL_TABLET | ORAL | 0 refills | Status: DC

## 2015-10-25 NOTE — Progress Notes (Signed)
Subjective:    Patient ID: Carla Bell, female   DOB: 26-Nov-1960      MRN: [id number]    Brief patient profile:  54 yowf quit smoking 1981 with no symptoms at all when  identified as alpha one AT [facility] then while living in [place] around 2010 pattern of bad colds >chest > admit 3-4 times per year dx as copd/asthma with pericardial effusion in 2015 required window no dx > no admits since moved to [place] in 02/2015 referred to pulmonary clinic 07/12/2015 by Dr Francis Dowse and proved to have GOLD II copd 07/26/2015     History of Present Illness  07/12/2015 1st [facility] office visit/ Madelena Maturin   Chief Complaint  Patient presents with  . Pulmonary Consult    Referred by Dr. Thereasa Solo. Pt recently moved to [place] from [place]. She has hx of Alpha 1 def, COPD and Asthma. She has never smoked. She states her chest "is always tight and it's always hard to breathe". She states she gets SOB with exertion such as taking out the garbage and trying to talk and walk at the same time.    last felt "fine"  immediately p d/c from [facility] around 2016 spring then gradual return of symptoms and saba dep w/in a week or two of d/c Able to walk across parking lot and stops half way across the parking lot and half way down the aisles at grocery store  Inhalers don't really help(points to qvar in particular and says has to take it multiple times to "feel it" Sleeps at 45 degrees o/w feels smothering/immediately takes duoneb on rising but doesn't wake up prematurely   Breo works the best/ no resp to symb/qvar/advair/ note talking makes her as sob as walking.  To er 06/12/15 with atypical cp > cards w/u in progress  rec Plan A = Automatic =   Stiolto 2 puffs each am                                        Qvar 40 Take 2 puffs first thing in am and then another 2 puffs about 12 hours later                                       Pantoprazole (protonix) 40 mg   Take  30-60 min before first meal of the day and Pepcid  (famotidine)  20 mg one @  bedtime until return  Plan B = Backup Only use your albuterol as a rescue medication  Plan C = Crisis/Contingency  - only use your albuterol/ipatroprium nebulizer if you first try Plan B    07/26/2015  f/u ov/Adelayde Minney re: COPD GOLD II/ AB rx qvar 402bid/stiolto first thing in am /gerd rx  Chief Complaint  Patient presents with  . Follow-up    Pt states her breathing has improved some. She coughs some after using the Stiolto- "clear, thick, paste". She has not had to use proventil.   only uses proventil if overdoes it/ rare duoneb now  Hoarseness and cough much better overall  Doe = MMRC2 = can't walk a nl pace on a flat grade s sob but does fine slow and flat eg shopping  rec Plan A = Automatic =   Stiolto 2 puffs each am  Qvar 40 Take 2 puffs first thing in am and then another 2 puffs about 12 hours later                                       Pantoprazole (protonix) 40 mg   Take  30-60 min before first meal of the day and Pepcid (famotidine)  20 mg one @  bedtime until return  Plan B = Backup Only use your albuterol  Plan C = Crisis/Contingency  - only use your albuterol/ipatroprium nebulizer if you first try Plan B  Prevnar 13 today    10/25/2015  f/u ov/Vrishank Moster re:  GOLD II copd/ qvar 40 2bid/ stiolto 2 each am  Chief Complaint  Patient presents with  . Follow-up    Pt states approx 3 wks ago had increased cough after being exposed to old carpet they pulled up. She states her chest feels very tight in the am's, improves as the day goes. Her cough is prod with green sputum. She c/o feeling lightheaded for the past month.   doing better until ran out of stiolto and insurance won't cover but doesn't know what it will cover Never happy with stiolto anyway in terms of am benefit and usually needs to use saba then anyway  No obvious other patterns to explain any day to day or daytime variability or assoc  mucus plugs or hemoptysis  or cp or , subjective wheeze or overt sinus or hb symptoms. No unusual exp hx or h/o childhood pna/ asthma or knowledge of premature birth.  Also denies any obvious fluctuation of symptoms with weather or environmental changes or other aggravating or alleviating factors except as outlined above   Current Medications, Allergies, Complete Past Medical History, Past Surgical History, Family History, and Social History were reviewed in [employer] record.  ROS  The following are not active complaints unless bolded sore throat, dysphagia, dental problems, itching, sneezing,  nasal congestion or excess/ purulent secretions, ear ache,   fever, chills, sweats, unintended wt loss, classically pleuritic or exertional cp,  orthopnea pnd or leg swelling, presyncope, palpitations, abdominal pain, anorexia, nausea, vomiting, diarrhea  or change in bowel or bladder habits, change in stools or urine, dysuria,hematuria,  rash, arthralgias, visual complaints, headache, numbness, weakness or ataxia or problems with walking or coordination,  change in mood/affect or memory.              Objective:   Physical Exam   amb wf nad   10/25/2015        183  07/26/2015         177   07/12/15 174 lb (78.926 kg)  07/09/15 177 lb 9.6 oz (80.559 kg)  07/03/15 176 lb (79.833 kg)    Vital signs reviewed   HEENT: poor dentition/ nl turbinates, and oropharynx. Nl external ear canals without cough reflex   NECK :  without JVD/Nodes/TM/ nl carotid upstrokes bilaterally   LUNGS: no acc muscle use,  Nl contour chest which is clear to A and P bilaterally without cough on insp or exp maneuvers   CV:  RRR  no s3 or murmur or increase in P2, no edema   ABD:  soft and nontender with nl inspiratory excursion in the supine position. No bruits or organomegaly, bowel sounds nl  MS:  Nl gait/ ext warm without deformities, calf tenderness, cyanosis or clubbing No obvious joint restrictions  SKIN: warm  and dry without lesions    NEURO:  alert, approp, nl sensorium with  no motor deficits     I personally reviewed images and agree with radiology impression as follows:  CXR:  06/12/15 Cardiomediastinal silhouette is unremarkable. No infiltrate or pleural effusion. No pulmonary edema. Bony thorax is unremarkable     Labs ordered/ reviewed:     Chemistry      Component Value Date/Time   NA 138 06/12/2015 1837   K 4.1 06/12/2015 1837   CL 103 06/12/2015 1837   CO2 26 06/12/2015 1837   BUN 14 06/12/2015 1837   CREATININE 0.95 06/12/2015 1837      Component Value Date/Time   CALCIUM 9.7 06/12/2015 1837        Lab Results  Component Value Date   WBC 7.1 06/12/2015   HGB 12.8 06/12/2015   HCT 38.0 06/12/2015   MCV 90.3 06/12/2015   PLT 265 06/12/2015        Lab Results  Component Value Date   TSH 0.90 07/03/2015              Assessment:

## 2015-10-25 NOTE — Patient Instructions (Addendum)
Plan A = Automatic =  BEVESPI  Take 2 puffs first thing in am and then another 2 puffs about 12 hours later and qvar 80 Take 2 puffs first thing in am and then another 2 puffs about 12 hours later.     Plan B = Backup Only use your albuterol (as a rescue medication to be used if you can't catch your breath by resting or doing a relaxed purse lip breathing pattern.  - The less you use it, the better it will work when you need it. - Ok to use the inhaler up to 2 puffs  every 4 hours if you must but call for appointment if use goes up over your usual need - Don't leave home without it !!  (think of it like the spare tire for your car)   Plan C = Crisis - only use your albuterol nebulizer if you first try Plan B and it fails to help > ok to use the nebulizer up to every 4 hours but if start needing it regularly call for immediate appointment   zpak/ Prednisone 10 mg take  4 each am x 2 days,   2 each am x 2 days,  1 each am x 2 days and stop   Please schedule a follow up office visit in 4 weeks, sooner if needed - check your insurance formulary for list of respiratory medications available on the plan and bring it with you  Please schedule a follow up office visit in 4 weeks, sooner if needed with all active meds/ inhalers and neb solutions in hand

## 2015-10-27 ENCOUNTER — Encounter: Payer: Self-pay | Admitting: Internal Medicine

## 2015-10-27 NOTE — Assessment & Plan Note (Signed)
07/12/15  > try stiolto respimat plus qvar 40 2bid (LAMA/LABA/ICS) - Spirometry 07/12/2015  FEV1 1.42  (47%)  Ratio 65 w/in sev hours of saba   - 07/12/2015 alpha one AT >  MZ/ level 85 (while not having flare)  - PFT's  07/26/2015  FEV1 2.22 (70 % ) ratio 68  p 15 % improvement from saba p no rx prior to study with DLCO  71 % corrects to 86 % for alv volume     - 10/25/2015  After extensive coaching HFA effectiveness =    90% try bevespi x 4 week sample plus increase qvar to 80 2bid   I had an extended discussion with the patient reviewing all relevant studies completed to date and  lasting 15 to 20 minutes of a 25 minute visit on the following ongoing concerns:   Formulary restrictions will be an ongoing challenge for the forseable future and I would be happy to pick an alternative if the pt will first  provide me a list of them but pt  will need to return here for training for any new device that is required eg dpi vs hfa vs respimat.    In meantime we can always provide samples so the patient never runs out of any needed respiratory medications.   Each maintenance medication was reviewed in detail including most importantly the difference between maintenance and as needed and under what circumstances the prns are to be used.  Please see instructions for details which were reviewed in writing and the patient given a copy.

## 2015-10-31 ENCOUNTER — Telehealth: Payer: Self-pay | Admitting: Internal Medicine

## 2015-10-31 NOTE — Telephone Encounter (Signed)
Qnasl is not covered by pt's insurance. Called [employer] at [phone]. Pt's ID: [id number] K. PA has been sent for pharmacist review.  I do not see where this prescription was even prescribed for this pt. I called [facility] in [place] and was told that MW sent this over on 10/25/15. There is no documentation of this in the pt's chart.

## 2015-11-05 NOTE — Telephone Encounter (Signed)
So delete the bevespi from the med list and replace with stiolto 2 puffs each am

## 2015-11-05 NOTE — Telephone Encounter (Signed)
I have updated medication list.  Nothing further needed

## 2015-11-05 NOTE — Telephone Encounter (Signed)
She was on qvar per my records and we did not prescribe qnasal but happy to do so if she feels it helps:  Best inhaled steroid like it is nasonex

## 2015-11-05 NOTE — Telephone Encounter (Signed)
PA for QNASAL denied. I do not even see where we sent this in for pt. The preferred medications are fluticasone, nasonex, patanase, astepro, azelastine. Please advise MW thanks

## 2015-11-05 NOTE — Telephone Encounter (Signed)
Patient states that the [place] worked for her and she needed a [place] for [employer].  She tried the [place] and it does not work at all for her, she said that it makes her very fatigued.    She is using the Qvar and Stiolto samples.  She said that the [employer] and Qvar work great for her.  She has 1 more sample of Stiolto left and says it will be enough until her follow up appointment with Dr. Melvyn Novas.  The Qvar is approved through her insurance, she just needs the [employer] approved.    Called [employer] at [phone] to initiate PA for Stiolto.Marland Kitchen  PA # [id number] Approved through 10/30/2016  ------------------------------------ Dr. Melvyn Novas, Stiolto has been approved through [facility].  Is it okay to send Rx to pharmacy?  Per last OV,   Plan A = Automatic =  BEVESPI  Take 2 puffs first thing in am and then another 2 puffs about 12 hours later and qvar 80 Take 2 puffs first thing in am and then another 2 puffs about 12 hours later.     Plan B = Backup Only use your albuterol (as a rescue medication to be used if you can't catch your breath by resting or doing a relaxed purse lip breathing pattern.  - The less you use it, the better it will work when you need it. - Ok to use the inhaler up to 2 puffs  every 4 hours if you must but call for appointment if use goes up over your usual need - Don't leave home without it !!  (think of it like the spare tire for your car)   Plan C = Crisis - only use your albuterol nebulizer if you first try Plan B and it fails to help > ok to use the nebulizer up to every 4 hours but if start needing it regularly call for immediate appointment   zpak/ Prednisone 10 mg take  4 each am x 2 days,   2 each am x 2 days,  1 each am x 2 days and stop   Please schedule a follow up office visit in 4 weeks, sooner if needed - check your insurance formulary for list of respiratory medications available on the plan and bring it with you  Please schedule a follow up office visit in 4  weeks, sooner if needed with all active meds/ inhalers and neb solutions in hand

## 2015-11-22 ENCOUNTER — Ambulatory Visit: Payer: Medicaid Other | Admitting: Internal Medicine

## 2016-01-27 ENCOUNTER — Ambulatory Visit: Payer: Medicaid Other | Admitting: Internal Medicine

## 2016-04-14 ENCOUNTER — Ambulatory Visit (HOSPITAL_COMMUNITY)
Admission: RE | Admit: 2016-04-14 | Discharge: 2016-04-14 | Disposition: A | Discharge status: Home / Self Care | Payer: Medicaid Other | Source: Ambulatory Visit | Attending: Internal Medicine | Admitting: Internal Medicine

## 2016-04-14 ENCOUNTER — Encounter: Payer: Self-pay | Admitting: Internal Medicine

## 2016-04-14 ENCOUNTER — Telehealth: Payer: Self-pay | Admitting: Internal Medicine

## 2016-04-14 ENCOUNTER — Ambulatory Visit: Payer: Medicaid Other | Attending: Internal Medicine | Admitting: Internal Medicine

## 2016-04-14 VITALS — BP 135/80 | HR 67 | Temp 97.9°F | Resp 16 | Wt 193.4 lb

## 2016-04-14 DIAGNOSIS — M722 Plantar fascial fibromatosis: Secondary | ICD-10-CM

## 2016-04-14 DIAGNOSIS — Z87891 Personal history of nicotine dependence: Secondary | ICD-10-CM | POA: Insufficient documentation

## 2016-04-14 DIAGNOSIS — Z1321 Encounter for screening for nutritional disorder: Secondary | ICD-10-CM

## 2016-04-14 DIAGNOSIS — Z23 Encounter for immunization: Secondary | ICD-10-CM

## 2016-04-14 DIAGNOSIS — Z1239 Encounter for other screening for malignant neoplasm of breast: Secondary | ICD-10-CM

## 2016-04-14 DIAGNOSIS — Z1159 Encounter for screening for other viral diseases: Secondary | ICD-10-CM

## 2016-04-14 DIAGNOSIS — Z131 Encounter for screening for diabetes mellitus: Secondary | ICD-10-CM

## 2016-04-14 DIAGNOSIS — J45909 Unspecified asthma, uncomplicated: Secondary | ICD-10-CM | POA: Insufficient documentation

## 2016-04-14 DIAGNOSIS — R0600 Dyspnea, unspecified: Secondary | ICD-10-CM | POA: Insufficient documentation

## 2016-04-14 DIAGNOSIS — M79673 Pain in unspecified foot: Secondary | ICD-10-CM | POA: Insufficient documentation

## 2016-04-14 DIAGNOSIS — J449 Chronic obstructive pulmonary disease, unspecified: Secondary | ICD-10-CM | POA: Diagnosis present

## 2016-04-14 DIAGNOSIS — M47814 Spondylosis without myelopathy or radiculopathy, thoracic region: Secondary | ICD-10-CM | POA: Diagnosis not present

## 2016-04-14 DIAGNOSIS — Z1231 Encounter for screening mammogram for malignant neoplasm of breast: Secondary | ICD-10-CM

## 2016-04-14 DIAGNOSIS — R635 Abnormal weight gain: Secondary | ICD-10-CM | POA: Insufficient documentation

## 2016-04-14 DIAGNOSIS — E8801 Alpha-1-antitrypsin deficiency: Secondary | ICD-10-CM | POA: Insufficient documentation

## 2016-04-14 DIAGNOSIS — Z114 Encounter for screening for human immunodeficiency virus [HIV]: Secondary | ICD-10-CM

## 2016-04-14 DIAGNOSIS — Z148 Genetic carrier of other disease: Secondary | ICD-10-CM

## 2016-04-14 LAB — CMP AND LIVER
ALBUMIN: 4.3 g/dL (ref 3.6–5.1)
ALT: 9 U/L (ref 6–29)
AST: 15 U/L (ref 10–35)
Alkaline Phosphatase: 84 U/L (ref 33–130)
BILIRUBIN DIRECT: 0.1 mg/dL (ref <=0.2)
BILIRUBIN TOTAL: 0.4 mg/dL (ref 0.2–1.2)
BUN: 15 mg/dL (ref 7–25)
CALCIUM: 9.6 mg/dL (ref 8.6–10.4)
CHLORIDE: 103 mmol/L (ref 98–110)
CO2: 31 mmol/L (ref 20–31)
CREATININE: 0.92 mg/dL (ref 0.50–1.05)
Glucose, Bld: 110 mg/dL — ABNORMAL HIGH (ref 65–99)
Indirect Bilirubin: 0.3 mg/dL (ref 0.2–1.2)
Potassium: 4.6 mmol/L (ref 3.5–5.3)
Sodium: 142 mmol/L (ref 135–146)
TOTAL PROTEIN: 7 g/dL (ref 6.1–8.1)

## 2016-04-14 LAB — TSH: TSH: 1.18 m[IU]/L

## 2016-04-14 LAB — POCT GLYCOSYLATED HEMOGLOBIN (HGB A1C): HEMOGLOBIN A1C: 5.3

## 2016-04-14 MED ORDER — MELOXICAM 15 MG PO TABS: 15.0000 mg | ORAL_TABLET | Freq: Two times a day (BID) | ORAL | 0 refills | Status: DC

## 2016-04-14 MED ORDER — MELOXICAM 15 MG PO TABS: 15.0000 mg | ORAL_TABLET | Freq: Every day | ORAL | 0 refills | Status: DC

## 2016-04-14 MED ORDER — CYCLOBENZAPRINE HCL 10 MG PO TABS: 10.0000 mg | ORAL_TABLET | Freq: Three times a day (TID) | ORAL | 0 refills | Status: DC | PRN

## 2016-04-14 NOTE — Telephone Encounter (Signed)
cxr nml. Left message with family.  Will ask cma to call back later.

## 2016-04-14 NOTE — Progress Notes (Signed)
Carla Bell, is a 56 y.o. female  [id number]  [id number]  DOB - 03/26/1960  Chief Complaint  Patient presents with  . Foot Pain        Subjective:   Carla Bell is a 56 y.o. female here today for a follow up visit. Last seen in clinic 4/17, w/ hx of copd, alpha 1 antitrypsin def.  She is being followed in Pulm clinic w/ DR Melvyn Novas and they are working on getting her on the right regimen of meds. Of notes, she c/o of increasing, progressive doe, but this has been ongoing for months. She states she cannot talk and walk at same time. Denies orthopnea/chest pain/pleuritic pain. She is due for f/u w/ Dr Melvyn Novas as well.  She notes she has gained about 20lbs in last few months, but she has not changed her diet at all. Avoids salt and eating junk food.   Pt had prior hx of pericardia window in 2015, since than she sometimes feels an "airbubble" below her left breast, tender when she bends over.  She c/o of left foot pain as well, on the plantar fascia area more so than the heal for last few months. She has tried OTC orthotics, she has tried different shoes w/o relief. 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  Patient has No headache, No chest pain, No abdominal pain - No Nausea, No new weakness tingling or numbness, No Cough - SOB.  No problems updated.  ALLERGIES: No Known Allergies  PAST MEDICAL HISTORY: Past Medical History:  Diagnosis Date  . Asthma   . Cancer ([place])   . COPD (chronic obstructive pulmonary disease) ([place])   . Pericardial effusion 07/09/2015  . Pleuritic chest pain 07/09/2015    MEDICATIONS AT HOME: Prior to Admission medications   Medication Sig Start Date End Date Taking? Authorizing Provider  azithromycin (ZITHROMAX) 250 MG tablet Take 2 on day one then 1 daily x 4 days Patient not taking: Reported on 04/14/2016 10/25/15   Tanda Rockers, MD  beclomethasone (QVAR) 80 MCG/ACT inhaler Take 2  puffs first thing in am and then another 2 puffs about 12 hours later. 10/25/15   Tanda Rockers, MD  cyclobenzaprine (FLEXERIL) 10 MG tablet Take 1 tablet (10 mg total) by mouth 3 (three) times daily as needed for muscle spasms. 04/14/16   Maren Reamer, MD  fluticasone (FLONASE) 50 MCG/ACT nasal spray Place 1 spray into both nostrils daily. 06/14/15   Argentina Donovan, PA-C  ipratropium-albuterol (DUONEB) 0.5-2.5 (3) MG/3ML SOLN Take 3 mLs by nebulization every 6 (six) hours as needed (sob/doe). 07/03/15   Tresa Garter, MD  meloxicam (MOBIC) 15 MG tablet Take 1 tablet (15 mg total) by mouth daily. 04/14/16   Maren Reamer, MD  predniSONE (DELTASONE) 10 MG tablet Take  4 each am x 2 days,   2 each am x 2 days,  1 each am x 2 days and stop Patient not taking: Reported on 04/14/2016 10/25/15   Tanda Rockers, MD  Tiotropium Bromide-Olodaterol (STIOLTO RESPIMAT) 2.5-2.5 MCG/ACT AERS Inhale 2 puffs into the lungs daily.    Historical Provider, MD  traZODone (DESYREL) 150 MG tablet Take 150 mg by mouth at bedtime.    Historical Provider, MD     Objective:   Vitals:   04/14/16 1128  BP: 135/80  Pulse: 67  Resp: 16  Temp: 97.9 F (36.6 C)  TempSrc: Oral  SpO2: 95%  Weight: 193 lb 6.4 oz (87.7 kg)    Exam General appearance : Awake, alert, not in any distress. Speech Clear. Not toxic looking, obese, pleasant. HEENT: Atraumatic and Normocephalic, pupils equally reactive to light. Neck: supple, no JVD. No cervical lymphadenopathy.  Chest:Good air entry bilaterally, no added sounds.  1 rare exp wheeze, but resolved after deep inhalation. CVS: S1 S2 regular, no murmurs/gallups or rubs. Abdomen: Bowel sounds active, obese,  Non tender and not distended with no gaurding, rigidity or rebound. Extremities: B/L Lower Ext shows no edema, both legs are warm to touch Right foot, exquisitely ttp on plantar fascia, mild ttp on heal. Decrease sensation noted w/ monofilament testing as well on right  foot. 2+ periph pulses. Neurology: Awake alert, and oriented X 3, CN II-XII grossly intact, Non focal Skin:No Rash  Data Review Lab Results  Component Value Date   HGBA1C 5.3 04/14/2016   HGBA1C 5.1 07/03/2015    Depression screen PHQ 2/9 04/14/2016 07/03/2015  Decreased Interest 0 0  Down, Depressed, Hopeless 0 0  PHQ - 2 Score 0 0  Altered sleeping - 2  Tired, decreased energy - 1  Change in appetite - 0  Feeling bad or failure about yourself  - 0  Trouble concentrating - 0  Moving slowly or fidgety/restless - 0  Suicidal thoughts - 0  PHQ-9 Score - 3  Difficult doing work/chores - Somewhat difficult      Assessment & Plan   1. COPD GOLD II/ MZ/ quit smoking 1981  - recd f/u w/ Pulm soon, may need more adjustments w/ rx, do not see active AECOPD at this time. Do not need steroids/abx currently. - CMP and Liver - TSH - CBC with Differential  2. Plantar fasciitis suspected - short course of mobic 15mg  po bid, and flexeril prn - info provided, weight loss may help as well. - Ambulatory referral to Podiatry - orthotics  3. Alpha-1-antitrypsin deficiency carrier ([place]) See #1  4. Dyspnea, unspecified type - ?pt c/o of feeling "air bubble" sometimes below her left breast, hx of prior pericardial window in 2015 - DG Chest 2 View; Future  5. Diabetes mellitus screening - HgB A1c  6. Need for hepatitis C screening test - Hepatitis C antibody  7. Encounter for screening for HIV - HIV antibody (with reflex)  8. Breast cancer screening - MM Digital Screening; Future  9. Encounter for vitamin deficiency screening - recd calcium 1200mg  /qd,  - Vitamin D, 25-hydroxy  10. Weight gain Low carb/hf /if fasting diet info provided, increase ambulation/exercise as tolerated recd as well  11. Encounter for immunization - tdap - Flu Vaccine QUAD 36+ mos IM - pneumococcal 23 today (had pneumococcal 13 on 5/17)    Patient have been counseled extensively about nutrition  and exercise  Return in about 3 months (around 07/13/2016), or if symptoms worsen or fail to improve, for back 2-3 wks if still w/ foot pains..  The patient was given clear instructions to go to ER or return to medical center if symptoms don't improve, worsen or new problems develop. The patient verbalized understanding. The patient was told to call to get lab results if they haven't heard anything in the next week.   This note has been created with Surveyor, quantity. Any transcriptional errors are unintentional.   Maren Reamer, MD, [facility] [place], [place]   04/14/2016, 12:47  PM

## 2016-04-14 NOTE — Patient Instructions (Addendum)
Calcium 1,200mg  /daily - for bone health Vit d - pending labs.  QUICK START PATIENT GUIDE TO LCHF/IF LOW CARB HIGH FAT / INTERMITTENT FASTING  Recommend: <50 gram carbohydrate a day for weightloss.  What is this diet and how does it work? o Insulin is a hormone made by your body that allows you to use sugar (glucose) from carbohydrates in the food you eat for energy or to store glucose (as fat) for future use  o Insulin levels need to be lowered in order to utilize our stored energy (fat) o Many struggling with obesity are insulin resistant and have high levels of insulin o This diet works to lower your insulin in two ways o Fasting - allows your insulin levels to naturally decrease  o Avoiding carbohydrates - carbs trigger increase in insulin Low Carb Healthy Fat (LCHF) o Get a free app for your phone, such as MyFitnessPal, to help you track your macronutrients (carbs/protein/fats) and to track your weight and body measurements to see your progress o Set your goal for around 10% carbs/20% protein/70% fat o A good starting goal for amount of net carbs per day is 50 grams (some will aim for 20 grams) o "Net carbs" refers to total grams of carbs minus grams of fiber (as fiber is not typically absorbed). For example, if a food has 5g total carb and 3g fiber, that would be 2g net carbs o Increase healthy fats - eg. olive oil, eggs, nuts, avocado, cheese, butter, coconut, meats, fish o Avoid high carb foods - eg. bread, pasta, potatoes, rice, cookies, soda, juice, anything sugary o Buy full-fat ingredients (avoid low-fat versions, which often have more sugar) o No need to count calories, but pay close attention to grams of carbs on labels Intermittent Fasting (IF) o "Fasting" is going a period of time without eating - it helps to stay busy and well-hydrated o Purpose of fasting is to allow insulin levels to drop as low as possible, allowing your body to switch into fat-burning mode o With this  diet there are many approaches to fasting, but 16:8 and 24hr fasts are commonly used o 16:8 fast, usually 5-7 days a week - Fasting for 16 hours of the day, then eating all meals for the day over course of 8 hours. o 24 hour fast, usually 1-3 days a week - Typically eating one meal a day, then fasting until the next day. Plenty of fluids (and some salt to help you hold onto fluids) are recommended during longer fasts.  o During fasts certain beverages are still acceptable - water, sparkling water, bone broth, black tea or coffee, or tea/coffee with small amount of heavy whipping cream Special note for those on diabetic medications o Discuss your medications with your physician. You may need to hold your medication or adjust to only taking when eating. Diabetics should keep close track of their blood sugars when making any changes to diet/meds, to ensure they are staying within normal limits For more info about LCHF/IF o Watch "Therapeutic Fasting - Solving the Two-Compartment Problem" video by Dr. Sharman Cheek on YouTube (GreatestGyms.com.ee) for a great intro to these concepts o Read "The Obesity Code" and/or "The Complete Guide to Fasting" by Dr. Sharman Cheek o Go to www.dietdoctor.com for explanations, recipes, and infographics about foods to eat/avoid o Get a Free smartphone app that helps count carbohydrates  - ie MyKeto EXAMPLES TO GET STARTED Fasting Beverages -water (can add  tsp Brushy salt once or twice  a day to help stay hydrated for longer fasts) -Sparkling water (such as [employer] or similar; avoid any with artificial sweeteners)  -Bone broth (multiple recipes available online or can buy pre-made) -Tea or Coffee (Adding heavy whipping cream or coconut oil to your tea or coffee can be helpful if you find yourself getting too hungry during the fasts. Can also add cinnamon for flavor. Or "bulletproof coffee.") Low Carb Healthy Fat Breakfast (if not  fasting) -eggs in butter or olive oil with avocado -omelet with veggies and cheese  Lunch -hamburger with cheese and avocado wrapped in lettuce (no bun, no ketchup) -meat and cheese wrapped in lettuce (can dip in mustard or olive oil/vinegar/mayo) -salad with meat/cheese/nuts and higher fat dressing (vinaigrette or Ranch, etc) -tuna salad lettuce wrap -taco meat with cheese, sour cream, guacamole, cheese over lettuce  Dinner -steak with herb butter or Barnaise sauce -"Fathead" pizza (uses cheese and almond flour for the dough - several recipes available online) -roasted or grilled chicken with skin on, with low carb sauce (buffalo, garlic butter, alfredo, pesto, etc) -baked salmon with lemon butter -chicken alfredo with zucchini noodles -[place] butter chicken with low carb garlic naan -egg roll in a bowl  Side Dishes -mashed cauliflower (homemade or available in freezer section) -roast vegetables (green veggies that grow above ground rather than root veggies) with butter or cheese -Caprese salad (fresh mozzarella, tomato and basil with olive oil) -homemade low-carb coleslaw Snacks/Desserts (try to avoid unnecessary snacking and sweets in general) -celery or cucumber dipped in guacamole or sour cream dip -cheese and meat slices  -raspberries with whipped cream (can make homemade with no sugar added) -low carb [place] butter cake  AVOID - sugar, diet/regular soda, potatoes, breads, rice, pasta, candy, cookies, cakes, muffins, juice, high carb fruit (bananas, grapes), beer, ketchup, barbeque and other sweet sauces  -   Plantar Fasciitis Plantar fasciitis is a painful foot condition that affects the heel. It occurs when the band of tissue that connects the toes to the heel bone (plantar fascia) becomes irritated. This can happen after exercising too much or doing other repetitive activities (overuse injury). The pain from plantar fasciitis can range from mild irritation to severe  pain that makes it difficult for you to walk or move. The pain is usually worse in the morning or after you have been sitting or lying down for a while. CAUSES This condition may be caused by:  Standing for long periods of time.  Wearing shoes that do not fit.  Doing high-impact activities, including running, aerobics, and ballet.  Being overweight.  Having an abnormal way of walking (gait).  Having tight calf muscles.  Having high arches in your feet.  Starting a new athletic activity. SYMPTOMS The main symptom of this condition is heel pain. Other symptoms include:  Pain that gets worse after activity or exercise.  Pain that is worse in the morning or after resting.  Pain that goes away after you walk for a few minutes. DIAGNOSIS This condition may be diagnosed based on your signs and symptoms. Your health care provider will also do a physical exam to check for:  A tender area on the bottom of your foot.  A high arch in your foot.  Pain when you move your foot.  Difficulty moving your foot. You may also need to have imaging studies to confirm the diagnosis. These can include:  X-rays.  Ultrasound.  MRI. TREATMENT  Treatment for plantar fasciitis depends on the severity of  the condition. Your treatment may include:  Rest, ice, and over-the-counter pain medicines to manage your pain.  Exercises to stretch your calves and your plantar fascia.  A splint that holds your foot in a stretched, upward position while you sleep (night splint).  Physical therapy to relieve symptoms and prevent problems in the future.  Cortisone injections to relieve severe pain.  Extracorporeal shock wave therapy (ESWT) to stimulate damaged plantar fascia with electrical impulses. It is often used as a last resort before surgery.  Surgery, if other treatments have not worked after 12 months. HOME CARE INSTRUCTIONS  Take medicines only as directed by your health care  provider.  Avoid activities that cause pain.  Roll the bottom of your foot over a bag of ice or a bottle of cold water. Do this for 20 minutes, 3-4 times a day.  Perform simple stretches as directed by your health care provider.  Try wearing athletic shoes with air-sole or gel-sole cushions or soft shoe inserts.  Wear a night splint while sleeping, if directed by your health care provider.  Keep all follow-up appointments with your health care provider. PREVENTION   Do not perform exercises or activities that cause heel pain.  Consider finding low-impact activities if you continue to have problems.  Lose weight if you need to. The best way to prevent plantar fasciitis is to avoid the activities that aggravate your plantar fascia. SEEK MEDICAL CARE IF:  Your symptoms do not go away after treatment with home care measures.  Your pain gets worse.  Your pain affects your ability to move or do your daily activities. This information is not intended to replace advice given to you by your health care provider. Make sure you discuss any questions you have with your health care provider. Document Released: 11/25/2000 Document Revised: 06/24/2015 Document Reviewed: 01/10/2014 Elsevier Interactive Patient Education  2017 [place]. infTd Vaccine (Tetanus and Diphtheria): What You Need to Know 1. Why get vaccinated? Tetanus  and diphtheria are very serious diseases. They are rare in the [place] today, but people who do become infected often have severe complications. Td vaccine is used to protect adolescents and adults from both of these diseases. Both tetanus and diphtheria are infections caused by bacteria. Diphtheria spreads from person to person through coughing or sneezing. Tetanus-causing bacteria enter the body through cuts, scratches, or wounds. TETANUS (lockjaw) causes painful muscle tightening and stiffness, usually all over the body.  It can lead to tightening of muscles  in the head and neck so you can't open your mouth, swallow, or sometimes even breathe. Tetanus kills about 1 out of every 10 people who are infected even after receiving the best medical care. DIPHTHERIA can cause a thick coating to form in the back of the throat.  It can lead to breathing problems, paralysis, heart failure, and death. Before vaccines, as many as 200,000 cases of diphtheria and hundreds of cases of tetanus were reported in the [place] each year. Since vaccination began, reports of cases for both diseases have dropped by about 99%. 2. Td vaccine Td vaccine can protect adolescents and adults from tetanus and diphtheria. Td is usually given as a booster dose every 10 years but it can also be given earlier after a severe and dirty wound or burn. Another vaccine, called Tdap, which protects against pertussis in addition to tetanus and diphtheria, is sometimes recommended instead of Td vaccine. Your doctor or the person giving you the vaccine can give you  more information. Td may safely be given at the same time as other vaccines. 3. Some people should not get this vaccine  A person who has ever had a life-threatening allergic reaction after a previous dose of any tetanus or diphtheria containing vaccine, OR has a severe allergy to any part of this vaccine, should not get Td vaccine. Tell the person giving the vaccine about any severe allergies.  Talk to your doctor if you:  had severe pain or swelling after any vaccine containing diphtheria or tetanus,  ever had a condition called Guillain Barre Syndrome (GBS),  aren't feeling well on the day the shot is scheduled. 4. What are the risks from Td vaccine? With any medicine, including vaccines, there is a chance of side effects. These are usually mild and go away on their own. Serious reactions are also possible but are rare. Most people who get Td vaccine do not have any problems with it. Mild problems following Td  vaccine: (Did not interfere with activities)  Pain where the shot was given (about 8 people in 10)  Redness or swelling where the shot was given (about 1 person in 4)  Mild fever (rare)  Headache (about 1 person in 4)  Tiredness (about 1 person in 4) Moderate problems following Td vaccine: (Interfered with activities, but did not require medical attention)  Fever over 102F (rare) Severe problems following Td vaccine: (Unable to perform usual activities; required medical attention)  Swelling, severe pain, bleeding and/or redness in the arm where the shot was given (rare). Problems that could happen after any vaccine:  People sometimes faint after a medical procedure, including vaccination. Sitting or lying down for about 15 minutes can help prevent fainting, and injuries caused by a fall. Tell your doctor if you feel dizzy, or have vision changes or ringing in the ears.  Some people get severe pain in the shoulder and have difficulty moving the arm where a shot was given. This happens very rarely.  Any medication can cause a severe allergic reaction. Such reactions from a vaccine are very rare, estimated at fewer than 1 in a million doses, and would happen within a few minutes to a few hours after the vaccination. As with any medicine, there is a very remote chance of a vaccine causing a serious injury or death. The safety of vaccines is always being monitored. For more information, visit: http://www.aguilar.org/ 5. What if there is a serious reaction? What should I look for? Look for anything that concerns you, such as signs of a severe allergic reaction, very high fever, or unusual behavior. Signs of a severe allergic reaction can include hives, swelling of the face and throat, difficulty breathing, a fast heartbeat, dizziness, and weakness. These would usually start a few minutes to a few hours after the vaccination. What should I do?  If you think it is a severe allergic  reaction or other emergency that can't wait, call 9-1-1 or get the person to the nearest hospital. Otherwise, call your doctor.  Afterward, the reaction should be reported to the Vaccine Adverse Event Reporting System (VAERS). Your doctor might file this report, or you can do it yourself through the VAERS web site at www.vaers.SamedayNews.es, or by calling 726-876-9895.  VAERS does not give medical advice. 6. The National Vaccine Injury Compensation Program The [employer] Vaccine Injury Compensation Program (VICP) is a federal program that was created to compensate people who may have been injured by certain vaccines. Persons who believe they may have  been injured by a vaccine can learn about the program and about filing a claim by calling ([phone] 589-2698 or visiting the [place] website at GoldCloset.com.ee. There is a time limit to file a claim for compensation. 7. How can I learn more?  Ask your doctor. He or she can give you the vaccine package insert or suggest other sources of information.  Call your local or state health department.  Contact the Centers for Disease Control and Prevention (CDC):  Call (817) 639-7827 (1-800-CDC-INFO)  Visit CDC's website at http://hunter.com/ CDC Td Vaccine VIS (06/25/15) This information is not intended to replace advice given to you by your health care provider. Make sure you discuss any questions you have with your health care provider. Document Released: 12/28/2005 Document Revised: 11/21/2015 Document Reviewed: 11/21/2015 Elsevier Interactive Patient Education  2017 [place]. Pneumococcal Polysaccharide Vaccine: What You Need to Know 1. Why get vaccinated? Vaccination can protect older adults (and some children and younger adults) from pneumococcal disease. Pneumococcal disease is caused by bacteria that can spread from person to person through close contact. It can cause ear infections, and it can also lead to more serious infections of  the:  Lungs (pneumonia),  Blood (bacteremia), and  Covering of the brain and spinal cord (meningitis). Meningitis can cause deafness and brain damage, and it can be fatal. Anyone can get pneumococcal disease, but children under 4 years of age, people with certain medical conditions, adults over 62 years of age, and cigarette smokers are at the highest risk. About 18,000 older adults die each year from pneumococcal disease in the [place]. Treatment of pneumococcal infections with penicillin and other drugs used to be more effective. But some strains of the disease have become resistant to these drugs. This makes prevention of the disease, through vaccination, even more important. 2. Pneumococcal polysaccharide vaccine (PPSV23) Pneumococcal polysaccharide vaccine (PPSV23) protects against 23 types of pneumococcal bacteria. It will not prevent all pneumococcal disease. PPSV23 is recommended for:  All adults 36 years of age and older,  Anyone 2 through 58 years of age with certain long-term health problems,  Anyone 2 through 58 years of age with a weakened immune system,  Adults 34 through 58 years of age who smoke cigarettes or have asthma. Most people need only one dose of PPSV. A second dose is recommended for certain high-risk groups. People 67 and older should get a dose even if they have gotten one or more doses of the vaccine before they turned 65. Your healthcare provider can give you more information about these recommendations. Most healthy adults develop protection within 2 to 3 weeks of getting the shot. 3. Some people should not get this vaccine  Anyone who has had a life-threatening allergic reaction to PPSV should not get another dose.  Anyone who has a severe allergy to any component of PPSV should not receive it. Tell your provider if you have any severe allergies.  Anyone who is moderately or severely ill when the shot is scheduled may be asked to wait until they  recover before getting the vaccine. Someone with a mild illness can usually be vaccinated.  Children less than 52 years of age should not receive this vaccine.  There is no evidence that PPSV is harmful to either a pregnant woman or to her fetus. However, as a precaution, women who need the vaccine should be vaccinated before becoming pregnant, if possible. 4. Risks of a vaccine reaction With any medicine, including vaccines, there is a chance of  side effects. These are usually mild and go away on their own, but serious reactions are also possible. About half of people who get PPSV have mild side effects, such as redness or pain where the shot is given, which go away within about two days. Less than 1 out of 100 people develop a fever, muscle aches, or more severe local reactions. Problems that could happen after any vaccine:  People sometimes faint after a medical procedure, including vaccination. Sitting or lying down for about 15 minutes can help prevent fainting, and injuries caused by a fall. Tell your doctor if you feel dizzy, or have vision changes or ringing in the ears.  Some people get severe pain in the shoulder and have difficulty moving the arm where a shot was given. This happens very rarely.  Any medication can cause a severe allergic reaction. Such reactions from a vaccine are very rare, estimated at about 1 in a million doses, and would happen within a few minutes to a few hours after the vaccination. As with any medicine, there is a very remote chance of a vaccine causing a serious injury or death. The safety of vaccines is always being monitored. For more information, visit: http://www.aguilar.org/ 5. What if there is a serious reaction? What should I look for? Look for anything that concerns you, such as signs of a severe allergic reaction, very high fever, or unusual behavior. Signs of a severe allergic reaction can include hives, swelling of the face and throat,  difficulty breathing, a fast heartbeat, dizziness, and weakness. These would usually start a few minutes to a few hours after the vaccination. What should I do? If you think it is a severe allergic reaction or other emergency that can't wait, call 9-1-1 or get to the nearest hospital. Otherwise, call your doctor. Afterward, the reaction should be reported to the Vaccine Adverse Event Reporting System (VAERS). Your doctor might file this report, or you can do it yourself through the VAERS web site at www.vaers.SamedayNews.es, or by calling 5407395444. VAERS does not give medical advice. 6. How can I learn more?  Ask your doctor. He or she can give you the vaccine package insert or suggest other sources of information.  Call your local or state health department.  Contact the Centers for Disease Control and Prevention (CDC):  Call 470-503-0619 (1-800-CDC-INFO) or  Visit CDC's website at http://hunter.com/ CDC Pneumococcal Polysaccharide Vaccine VIS (07/07/13) This information is not intended to replace advice given to you by your health care provider. Make sure you discuss any questions you have with your health care provider. Document Released: 12/28/2005 Document Revised: 11/21/2015 Document Reviewed: 11/21/2015 Elsevier Interactive Patient Education  2017 [place]. Influenza Virus Vaccine injection (Fluarix) What is this medicine? INFLUENZA VIRUS VACCINE (in floo EN zuh VAHY ruhs vak SEEN) helps to reduce the risk of getting influenza also known as the flu. This medicine may be used for other purposes; ask your health care provider or pharmacist if you have questions. COMMON BRAND NAME(S): Fluarix, Fluzone What should I tell my health care provider before I take this medicine? They need to know if you have any of these conditions: -bleeding disorder like hemophilia -fever or infection -Guillain-Barre syndrome or other neurological problems -immune system problems -infection with  the human immunodeficiency virus (HIV) or AIDS -low blood platelet counts -multiple sclerosis -an unusual or allergic reaction to influenza virus vaccine, eggs, chicken proteins, latex, gentamicin, other medicines, foods, dyes or preservatives -pregnant or trying to get pregnant -  breast-feeding How should I use this medicine? This vaccine is for injection into a muscle. It is given by a health care professional. A copy of Vaccine Information Statements will be given before each vaccination. Read this sheet carefully each time. The sheet may change frequently. Talk to your pediatrician regarding the use of this medicine in children. Special care may be needed. Overdosage: If you think you have taken too much of this medicine contact a poison control center or emergency room at once. NOTE: This medicine is only for you. Do not share this medicine with others. What if I miss a dose? This does not apply. What may interact with this medicine? -chemotherapy or radiation therapy -medicines that lower your immune system like etanercept, anakinra, infliximab, and adalimumab -medicines that treat or prevent blood clots like warfarin -phenytoin -steroid medicines like prednisone or cortisone -theophylline -vaccines This list may not describe all possible interactions. Give your health care provider a list of all the medicines, herbs, non-prescription drugs, or dietary supplements you use. Also tell them if you smoke, drink alcohol, or use illegal drugs. Some items may interact with your medicine. What should I watch for while using this medicine? Report any side effects that do not go away within 3 days to your doctor or health care professional. Call your health care provider if any unusual symptoms occur within 6 weeks of receiving this vaccine. You may still catch the flu, but the illness is not usually as bad. You cannot get the flu from the vaccine. The vaccine will not protect against colds or  other illnesses that may cause fever. The vaccine is needed every year. What side effects may I notice from receiving this medicine? Side effects that you should report to your doctor or health care professional as soon as possible: -allergic reactions like skin rash, itching or hives, swelling of the face, lips, or tongue Side effects that usually do not require medical attention (report to your doctor or health care professional if they continue or are bothersome): -fever -headache -muscle aches and pains -pain, tenderness, redness, or swelling at site where injected -weak or tired This list may not describe all possible side effects. Call your doctor for medical advice about side effects. You may report side effects to FDA at 1-800-FDA-1088. Where should I keep my medicine? This vaccine is only given in a clinic, pharmacy, doctor's office, or other health care setting and will not be stored at home. NOTE: This sheet is a summary. It may not cover all possible information. If you have questions about this medicine, talk to your doctor, pharmacist, or health care provider.  2017 Elsevier/Gold Standard (2007-09-28 09:30:40)

## 2016-04-15 LAB — CBC WITH DIFFERENTIAL/PLATELET

## 2016-04-15 LAB — VITAMIN D 25 HYDROXY (VIT D DEFICIENCY, FRACTURES): Vit D, 25-Hydroxy: 35 ng/mL (ref 30–100)

## 2016-04-15 LAB — HIV ANTIBODY (ROUTINE TESTING W REFLEX): HIV 1&2 Ab, 4th Generation: NONREACTIVE

## 2016-04-15 LAB — HEPATITIS C ANTIBODY: HCV AB: NEGATIVE

## 2016-04-16 ENCOUNTER — Telehealth: Payer: Self-pay

## 2016-04-16 NOTE — Telephone Encounter (Signed)
Contacted pt and made aware of results

## 2016-04-16 NOTE — Telephone Encounter (Signed)
Contacted pt to go over lab and xray results pt is aware and doesn't have any questions or concerns  

## 2016-05-11 ENCOUNTER — Ambulatory Visit (INDEPENDENT_AMBULATORY_CARE_PROVIDER_SITE_OTHER): Payer: Medicaid Other | Admitting: Podiatry

## 2016-05-11 ENCOUNTER — Encounter: Payer: Self-pay | Admitting: Podiatry

## 2016-05-11 ENCOUNTER — Ambulatory Visit (INDEPENDENT_AMBULATORY_CARE_PROVIDER_SITE_OTHER): Payer: Medicaid Other

## 2016-05-11 VITALS — Resp 16 | Ht 69.0 in | Wt 197.0 lb

## 2016-05-11 DIAGNOSIS — M722 Plantar fascial fibromatosis: Secondary | ICD-10-CM

## 2016-05-11 DIAGNOSIS — M7751 Other enthesopathy of right foot: Secondary | ICD-10-CM

## 2016-05-11 DIAGNOSIS — M79671 Pain in right foot: Secondary | ICD-10-CM

## 2016-05-11 MED ORDER — IBUPROFEN 600 MG PO TABS: 600.0000 mg | ORAL_TABLET | Freq: Three times a day (TID) | ORAL | 1 refills | Status: DC | PRN

## 2016-05-17 MED ORDER — BETAMETHASONE SOD PHOS & ACET 6 (3-3) MG/ML IJ SUSP: 3.0000 mg | Freq: Once | INTRAMUSCULAR | Status: DC

## 2016-05-17 NOTE — Progress Notes (Signed)
   Subjective:  Patient presents today as a new patient for evaluation of right plantar forefoot pain. Patient states that she's also been having arch pain as well. Pain is been for approximately 4 months now. Patient denies trauma. Patient is taken molded and Flexeril with no relief.   Objective/Physical Exam General: The patient is alert and oriented x3 in no acute distress.  Dermatology: Skin is warm, dry and supple bilateral lower extremities. Negative for open lesions or macerations.  Vascular: Palpable pedal pulses bilaterally. No edema or erythema noted. Capillary refill within normal limits.  Neurological: Epicritic and protective threshold grossly intact bilaterally.   Musculoskeletal Exam: Pain on palpation and range of motion of the second metatarsal phalangeal joint right foot. There is some edema also noted within  Radiographic Exam:  Normal osseous mineralization. Joint spaces preserved. No fracture/dislocation/boney destruction.    Assessment: #1 capsulitis second MPJ right foot   Plan of Care:  #1 Patient was evaluated. #2 injection of 0.5 mL Celestone Soluspan injected in the patient's second MPJ right foot #3 prescription for Motrin 6 her milligrams 3 times a day #4 return to clinic in 4 weeks   Edrick Kins, DPM [facility]  Dr. Edrick Kins, [address]                                        [place], [place] [address]                Office [phone]  Fax [phone]

## 2016-06-05 ENCOUNTER — Encounter: Payer: Self-pay | Admitting: Internal Medicine

## 2016-06-05 ENCOUNTER — Ambulatory Visit (INDEPENDENT_AMBULATORY_CARE_PROVIDER_SITE_OTHER): Payer: Medicaid Other | Admitting: Internal Medicine

## 2016-06-05 VITALS — BP 124/76 | HR 83 | Ht 69.0 in | Wt 197.4 lb

## 2016-06-05 DIAGNOSIS — J449 Chronic obstructive pulmonary disease, unspecified: Secondary | ICD-10-CM

## 2016-06-05 MED ORDER — TIOTROPIUM BROMIDE MONOHYDRATE 2.5 MCG/ACT IN AERS: INHALATION_SPRAY | RESPIRATORY_TRACT | 11 refills | Status: DC

## 2016-06-05 MED ORDER — AZITHROMYCIN 250 MG PO TABS: ORAL_TABLET | ORAL | 0 refills | Status: DC

## 2016-06-05 MED ORDER — PREDNISONE 10 MG PO TABS: ORAL_TABLET | ORAL | 0 refills | Status: DC

## 2016-06-05 MED ORDER — BUDESONIDE-FORMOTEROL FUMARATE 160-4.5 MCG/ACT IN AERO: INHALATION_SPRAY | RESPIRATORY_TRACT | 11 refills | Status: DC

## 2016-06-05 MED ORDER — ALBUTEROL SULFATE (2.5 MG/3ML) 0.083% IN NEBU: 2.5000 mg | INHALATION_SOLUTION | RESPIRATORY_TRACT | 11 refills | Status: DC | PRN

## 2016-06-05 MED ORDER — TIOTROPIUM BROMIDE MONOHYDRATE 2.5 MCG/ACT IN AERS: 2.0000 | INHALATION_SPRAY | RESPIRATORY_TRACT | 0 refills | Status: DC

## 2016-06-05 MED ORDER — BUDESONIDE-FORMOTEROL FUMARATE 160-4.5 MCG/ACT IN AERO: 2.0000 | INHALATION_SPRAY | Freq: Two times a day (BID) | RESPIRATORY_TRACT | 0 refills | Status: DC

## 2016-06-05 NOTE — Patient Instructions (Addendum)
zpak / Prednisone 10 mg take  4 each am x 2 days,   2 each am x 2 days,  1 each am x 2 days and stop   Plan A = Automatic =  symbicort 160 Take 2 puffs first thing in am and then another 2 puffs about 12 hours later and spiriva 2 each am   Work on inhaler technique:  relax and gently blow all the way out then take a nice smooth deep breath back in, triggering the inhaler at same time you start breathing in.  Hold for up to 5 seconds if you can. Blow out thru nose. Rinse and gargle with water when done     Plan B = Backup Only use your albuterol as a rescue medication to be used if you can't catch your breath by resting or doing a relaxed purse lip breathing pattern.  - The less you use it, the better it will work when you need it. - Ok to use the inhaler up to 2 puffs  every 4 hours if you must but call for appointment if use goes up over your usual need - Don't leave home without it !!  (think of it like the spare tire for your car)   Plan C = Crisis - only use your albuterol nebulizer if you first try Plan B and it fails to help > ok to use the nebulizer up to every 4 hours but if start needing it regularly call for immediate appointment   Please schedule a follow up office visit in 4 weeks, sooner if needed

## 2016-06-05 NOTE — Progress Notes (Signed)
Subjective:    Patient ID: Carla Bell, female   DOB: [birth year]      MRN: [id number]    Brief patient profile:  51 yowf quit smoking 1981 with no symptoms at all when  identified as alpha one AT [facility] then while living in [place] around 2010 pattern of bad colds >chest > admit 3-4 times per year dx as copd/asthma with pericardial effusion in 2015 required window no dx > no admits since moved to [place] in 02/2015 referred to pulmonary clinic 07/12/2015 by Dr Francis Dowse and proved to have GOLD II copd 07/26/2015     History of Present Illness  07/12/2015 1st [facility] office visit/ Carla Bell   Chief Complaint  Patient presents with  . Pulmonary Consult    Referred by Dr. Thereasa Solo. Pt recently moved to [place] from [place]. She has hx of Alpha 1 def, COPD and Asthma. She has never smoked. She states her chest "is always tight and it's always hard to breathe". She states she gets SOB with exertion such as taking out the garbage and trying to talk and walk at the same time.    last felt "fine"  immediately p d/c from [facility] around 2016 spring then gradual return of symptoms and saba dep w/in a week or two of d/c Able to walk across parking lot and stops half way across the parking lot and half way down the aisles at grocery store  Inhalers don't really help(points to qvar in particular and says has to take it multiple times to "feel it" Sleeps at 45 degrees o/w feels smothering/immediately takes duoneb on rising but doesn't wake up prematurely   Breo works the best/ no resp to symb/qvar/advair/ note talking makes her as sob as walking.  To er 06/12/15 with atypical cp > cards w/u in progress  rec Plan A = Automatic =   Stiolto 2 puffs each am                                        Qvar 40 Take 2 puffs first thing in am and then another 2 puffs about 12 hours later                                       Pantoprazole (protonix) 40 mg   Take  30-60 min before first meal of the day and Pepcid  (famotidine)  20 mg one @  bedtime until return  Plan B = Backup Only use your albuterol as a rescue medication  Plan C = Crisis/Contingency  - only use your albuterol/ipatroprium nebulizer if you first try Plan B    07/26/2015  f/u ov/Carla Bell re: COPD GOLD II/ AB rx qvar 402bid/stiolto first thing in am /gerd rx  Chief Complaint  Patient presents with  . Follow-up    Pt states her breathing has improved some. She coughs some after using the Stiolto- "clear, thick, paste". She has not had to use proventil.   only uses proventil if overdoes it/ rare duoneb now  Hoarseness and cough much better overall  Doe = MMRC2 = can't walk a nl pace on a flat grade s sob but does fine slow and flat eg shopping  rec Plan A = Automatic =   Stiolto 2 puffs each am  Qvar 40 Take 2 puffs first thing in am and then another 2 puffs about 12 hours later                                       Pantoprazole (protonix) 40 mg   Take  30-60 min before first meal of the day and Pepcid (famotidine)  20 mg one @  bedtime until return  Plan B = Backup Only use your albuterol  Plan C = Crisis/Contingency  - only use your albuterol/ipatroprium nebulizer if you first try Plan B  Prevnar 13 today    10/25/2015  f/u ov/Carla Bell re:  GOLD II copd/ qvar 40 2bid/ stiolto 2 each am  Chief Complaint  Patient presents with  . Follow-up    Pt states approx 3 wks ago had increased cough after being exposed to old carpet they pulled up. She states her chest feels very tight in the am's, improves as the day goes. Her cough is prod with green sputum. She c/o feeling lightheaded for the past month.   doing better until ran out of stiolto and insurance won't cover but doesn't know what it will cover Never happy with stiolto anyway in terms of am benefit and usually needs to use saba then anyway rec Plan A = Automatic =  BEVESPI  Take 2 puffs first thing in am and then another 2 puffs about 12 hours later  and qvar 80 Take 2 puffs first thing in am and then another 2 puffs about 12 hours later.  Plan B = Backup Only use your albuterol (as a rescue medication to be used as a rescue Plan C = Crisis - only use your albuterol nebulizer if you first try Plan B and it fails to help > ok to use the nebulizer up to every 4 hours but if start needing it regularly call for immediate appointment zpak/ Prednisone 10 mg take  4 each am x 2 days,   2 each am x 2 days,  1 each am x 2 days and stop  Please schedule a follow up office visit in 4 weeks, sooner if needed with all active meds/ inhalers and neb solutions in hand > did not return    06/05/2016 acute extended ov/Carla Bell re:  Chief Complaint  Patient presents with  . Acute Visit    Pt c/o cough and fatigue for the past 10 days. Her cough is non prod, and her chest feels congested. She states having more SOB than usual "can't talk and walk"- making her feel depressed. She is using Duoneb 3-4 x per day.   worse for 1.5 months 2 months prior to OV  stiolto/qvar 80 and avg once a day, twice a week neb  Now up to 4x daily neb x last 10 days  Worse time is first thing in am and occ during the noct > thick grey mucus    No obvious day to day or daytime variability or assoc excess/ purulent sputum or mucus plugs or hemoptysis or cp or chest tightness, subjective wheeze or overt sinus or hb symptoms. No unusual exp hx or h/o childhood pna/ asthma or knowledge of premature birth.  Sleeping ok without nocturnal  or early am exacerbation  of respiratory  c/o's or need for noct saba. Also denies any obvious fluctuation of symptoms with weather or environmental changes or other aggravating or alleviating factors except as  outlined above   Current Medications, Allergies, Complete Past Medical History, Past Surgical History, Family History, and Social History were reviewed in [employer] record.  ROS  The following are not active complaints unless  bolded sore throat, dysphagia, dental problems, itching, sneezing,  nasal congestion or excess/ purulent secretions, ear ache,   fever, chills, sweats, unintended wt loss, classically pleuritic or exertional cp,  orthopnea pnd or leg swelling, presyncope, palpitations, abdominal pain, anorexia, nausea, vomiting, diarrhea  or change in bowel or bladder habits, change in stools or urine, dysuria,hematuria,  rash, arthralgias, visual complaints, headache, numbness, weakness or ataxia or problems with walking or coordination,  change in mood/affect or memory.                         Objective:   Physical Exam    amb anxious  wf nad  But somewhat of a helpless/ hopeless affect  06/05/2016         197  10/25/2015        183  07/26/2015         177   07/12/15 174 lb (78.926 kg)  07/09/15 177 lb 9.6 oz (80.559 kg)  07/03/15 176 lb (79.833 kg)    Vital signs reviewed - Note on arrival 02 sats  99% on RA     HEENT: poor dentition/ nl turbinates, and oropharynx. Nl external ear canals without cough reflex   NECK :  without JVD/Nodes/TM/ nl carotid upstrokes bilaterally   LUNGS: no acc muscle use,  Nl contour chest which is clear to A and P bilaterally without cough on insp or exp maneuvers   CV:  RRR  no s3 or murmur or increase in P2, no edema   ABD:  soft and nontender with nl inspiratory excursion in the supine position. No bruits or organomegaly, bowel sounds nl  MS:  Nl gait/ ext warm without deformities, calf tenderness, cyanosis or clubbing No obvious joint restrictions   SKIN: warm and dry without lesions    NEURO:  alert, approp, nl sensorium with  no motor deficits     I personally reviewed images and agree with radiology impression as follows:  CXR:   04/14/16 No active cardiopulmonary disease. Mild degenerative changes mid thoracic spine        Assessment:

## 2016-06-07 NOTE — Assessment & Plan Note (Signed)
07/12/15  > try stiolto respimat plus qvar 40 2bid (LAMA/LABA/ICS) - Spirometry 07/12/2015  FEV1 1.42  (47%)  Ratio 65 w/in sev hours of saba   - 07/12/2015 alpha one AT >  MZ/ level 85 (while not having flare)  - PFT's  07/26/2015  FEV1 2.22 (70 % ) ratio 68  p 15 % improvement from saba p no rx prior to study with DLCO  71 % corrects to 86 % for alv volume   - 10/25/2015  After extensive coaching HFA effectiveness =    90% try bevespi x 4 week sample plus increase qvar to 80 2bid   - 06/05/2016  After extensive coaching HFA effectiveness =    75% > try symb 160 2bid / spiriva smi 2 each am    Acute flare in pt nto well controlled at baseline with fev 1 typically > 2 lpm so needs a new maint rx = symbicort/spriiva samples x 4 weeks then return to regroup   In meantime rx acute flare with zpak/ prednisone x 6 days  I had an extended discussion with the patient reviewing all relevant studies completed to date and  lasting 15 to 20 minutes of a 25 minute acute  visit    Each maintenance medication was reviewed in detail including most importantly the difference between maintenance and prns and under what circumstances the prns are to be triggered using an action plan format that is not reflected in the computer generated alphabetically organized AVS.    Please see AVS for specific instructions unique to this visit that I personally wrote and verbalized to the the pt in detail and then reviewed with pt  by my nurse highlighting any  changes in therapy recommended at today's visit to their plan of care.

## 2016-06-08 ENCOUNTER — Ambulatory Visit (INDEPENDENT_AMBULATORY_CARE_PROVIDER_SITE_OTHER): Payer: Medicaid Other

## 2016-06-08 ENCOUNTER — Ambulatory Visit (INDEPENDENT_AMBULATORY_CARE_PROVIDER_SITE_OTHER): Payer: Medicaid Other | Admitting: Podiatry

## 2016-06-08 DIAGNOSIS — M7751 Other enthesopathy of right foot: Secondary | ICD-10-CM | POA: Diagnosis not present

## 2016-06-08 DIAGNOSIS — M205X1 Other deformities of toe(s) (acquired), right foot: Secondary | ICD-10-CM

## 2016-06-08 DIAGNOSIS — M79672 Pain in left foot: Secondary | ICD-10-CM | POA: Diagnosis not present

## 2016-06-08 DIAGNOSIS — M2011 Hallux valgus (acquired), right foot: Secondary | ICD-10-CM

## 2016-06-08 DIAGNOSIS — M21611 Bunion of right foot: Secondary | ICD-10-CM

## 2016-06-08 DIAGNOSIS — M2041 Other hammer toe(s) (acquired), right foot: Secondary | ICD-10-CM

## 2016-06-08 MED ORDER — TRAMADOL HCL 50 MG PO TABS: 50.0000 mg | ORAL_TABLET | Freq: Three times a day (TID) | ORAL | 0 refills | Status: DC | PRN

## 2016-06-12 MED ORDER — BETAMETHASONE SOD PHOS & ACET 6 (3-3) MG/ML IJ SUSP: 3.0000 mg | Freq: Once | INTRAMUSCULAR | Status: DC

## 2016-06-12 NOTE — Progress Notes (Signed)
   Subjective:  Patient presents today for follow-up evaluation of capsulitis to the second MPJ right foot. Patient states that there is been no alleviation of symptoms. Patient is still having pain to the right foot. Patient states that she has had chronic pain and tenderness for several months now. All conservative modalities have been unsuccessful in providing any sort of satisfactory alleviation of symptoms for the patient.  Objective/Physical Exam General: The patient is alert and oriented x3 in no acute distress.  Dermatology: Skin is warm, dry and supple bilateral lower extremities. Negative for open lesions or macerations.  Vascular: Palpable pedal pulses bilaterally. No edema or erythema noted. Capillary refill within normal limits.  Neurological: Epicritic and protective threshold grossly intact bilaterally.   Musculoskeletal Exam: Pain on palpation and range of motion of the second metatarsal phalangeal joint right foot. There is some edema also noted Limited range of motion with a palpable DJD and spurring exostosis to the first MPJ right foot. Pain on range of motion and palpation noted as well.  Radiographic Exam:  Joint space narrowing noted within the first MPJ right foot.  Assessment: #1 capsulitis second MPJ right foot #2 hammertoe deformity second digit right foot. 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 #4 hallux abductovalgus right foot.  Plan of Care:  #1 Patient was evaluated. #2 injection of 0.5 mL Celestone Soluspan injected in the patient's second MPJ right foot #3 continue Motrin 600 mg as needed #4 today we discussed conservative versus surgical management of the pathology presenting. Patient wants surgical intervention. All patient questions were answered. No guarantees were expressed or implied. All possible complications and details of the surgery were explained. #5 authorization for surgery initiated today. Surgery will consist of a cheilectomy  with possible decompression first metatarsal osteotomy right foot right first MPJ. Hammertoe correction second digit right foot with MPJ capsulotomy and possible Weil osteotomy. #6 return to clinic 1 week postop   Edrick Kins, DPM [facility]  Dr. Edrick Kins, [place]                                        Carla Bell, Carla Bell                Office [phone]  Fax [phone]

## 2016-06-15 ENCOUNTER — Ambulatory Visit: Payer: Medicaid Other

## 2016-06-16 ENCOUNTER — Telehealth: Payer: Self-pay | Admitting: Internal Medicine

## 2016-06-16 MED ORDER — TIOTROPIUM BROMIDE MONOHYDRATE 2.5 MCG/ACT IN AERS: 2.0000 | INHALATION_SPRAY | Freq: Every day | RESPIRATORY_TRACT | 0 refills | Status: DC

## 2016-06-16 NOTE — Telephone Encounter (Signed)
Spoke with pt, states spiriva is requiring a PA, requesting samples of spiriva to last until PA is approved.  Samples left up front.    Called [employer] to initiate PA for Spiriva, approved 06/16/16-06/11/17.    Reference #: [id number]    lmtcb X1 for pt to make aware of approval.

## 2016-06-17 NOTE — Telephone Encounter (Signed)
Pt aware of approval of her medication. Patient to call if anything further needed. Nothing further needed.

## 2016-07-06 ENCOUNTER — Ambulatory Visit: Payer: Medicaid Other | Admitting: Internal Medicine

## 2016-07-09 ENCOUNTER — Encounter: Payer: Self-pay | Admitting: Podiatry

## 2016-07-09 DIAGNOSIS — M7751 Other enthesopathy of right foot: Secondary | ICD-10-CM | POA: Diagnosis not present

## 2016-07-09 DIAGNOSIS — M2041 Other hammer toe(s) (acquired), right foot: Secondary | ICD-10-CM | POA: Diagnosis not present

## 2016-07-09 DIAGNOSIS — M2011 Hallux valgus (acquired), right foot: Secondary | ICD-10-CM | POA: Diagnosis not present

## 2016-07-10 ENCOUNTER — Telehealth: Payer: Self-pay | Admitting: *Deleted

## 2016-07-10 DIAGNOSIS — M2011 Hallux valgus (acquired), right foot: Secondary | ICD-10-CM

## 2016-07-10 DIAGNOSIS — M2042 Other hammer toe(s) (acquired), left foot: Secondary | ICD-10-CM

## 2016-07-10 DIAGNOSIS — Z9889 Other specified postprocedural states: Secondary | ICD-10-CM

## 2016-07-10 NOTE — Telephone Encounter (Addendum)
Pt states she had surgery yesterday and is home alone a lot and would like crutches. Dr. Trinidad Curet. I informed pt she could get crutches from a medical supply store and I would fax rx for crutches to [facility] in [place], and I gave their [phone]. Pt states understanding. Faxed crutches rx to [email or web address].07/13/2016-Pt states she is having throbbing and sharp pain in her surgery foot. I instructed pt to remove the boot, open-ended sock, and ace wrap not to touch the gauze, and elevate the foot for 15 minutes, but if the pain worsened elevated then dangle the foot this being the only time it was okay to dangle the foot, after 15 minutes place foot level with the hip and rewrap the ace looser starting at the toes, replace the sock and boot. I told pt Dr. Amalia Hailey would refill the [place] and her husband could pick up in the [place] office. I told pt she could take ibuprofen OTC as package directs if she tolerated. Pt states she can take, but has been taking Goody powder, I told her not to take any more Goody powders. Dr. Amalia Hailey ordered Percocet 5/325mg  #30 one tablet every 6 hours prn foot pain. 07/20/2016-Pt states the blister on the 2nd toe is increasing, has pressure sores, and increasing redness and swelling. I had scheduler to contact pt to get an appt to come in today.07/21/2016-Pt states the antibiotic she received yesterday is giving her a yeast infections. Dr. Amalia Hailey ordered Diflucan 150mg  #1 take as directed. I informed pt the Diflucan had been sent to the [facility] 1558.08/05/2016-Referral orders of 08/05/2016 9:34am from Dr. Amalia Hailey faxed to [facility].

## 2016-07-13 MED ORDER — OXYCODONE-ACETAMINOPHEN 5-325 MG PO TABS: 1.0000 | ORAL_TABLET | Freq: Four times a day (QID) | ORAL | 0 refills | Status: DC | PRN

## 2016-07-15 ENCOUNTER — Encounter: Payer: Self-pay | Admitting: Podiatry

## 2016-07-15 ENCOUNTER — Ambulatory Visit (INDEPENDENT_AMBULATORY_CARE_PROVIDER_SITE_OTHER): Payer: Medicaid Other

## 2016-07-15 ENCOUNTER — Ambulatory Visit (INDEPENDENT_AMBULATORY_CARE_PROVIDER_SITE_OTHER): Payer: Self-pay | Admitting: Podiatry

## 2016-07-15 VITALS — Temp 96.8°F

## 2016-07-15 DIAGNOSIS — M2011 Hallux valgus (acquired), right foot: Secondary | ICD-10-CM

## 2016-07-15 DIAGNOSIS — Z9889 Other specified postprocedural states: Secondary | ICD-10-CM

## 2016-07-15 DIAGNOSIS — M205X1 Other deformities of toe(s) (acquired), right foot: Secondary | ICD-10-CM | POA: Diagnosis not present

## 2016-07-15 DIAGNOSIS — M2041 Other hammer toe(s) (acquired), right foot: Secondary | ICD-10-CM

## 2016-07-15 DIAGNOSIS — M21611 Bunion of right foot: Secondary | ICD-10-CM | POA: Diagnosis not present

## 2016-07-15 MED ORDER — AMOXICILLIN-POT CLAVULANATE 875-125 MG PO TABS
1.0000 | ORAL_TABLET | Freq: Two times a day (BID) | ORAL | 0 refills | Status: DC
Start: 2016-07-15 — End: 2016-08-14

## 2016-07-17 NOTE — Progress Notes (Signed)
   Subjective:  Patient presents today POV #1 status post right foot surgery. DOS: 07/09/16. Patient reports she is doing okay however she is experiencing significant pain to the dorsum of the right foot. She has been elevating, icing and resting the foot.    Objective/Physical Exam Skin incisions appear to be well coapted with sutures and staples intact. No sign of infectious process noted. No dehiscence. No active bleeding noted. Moderate edema noted to the surgical extremity.  Radiographic Exam:  Orthopedic hardware and osteotomies sites appear to be stable with routine healing  Assessment: 1. s/p right foot surgery DOS: 07/09/16   Plan of Care:  1. Patient was evaluated. X-rays reviewed 2. Dressings changed 3. Postop shoe dispensed 4. Prescription for Augmentin #14 given 5. Return to clinic in 1 week   Edrick Kins, DPM [facility]  Dr. Edrick Kins, [place]                                        [place], [place] [address]                Office [phone]  Fax [phone]

## 2016-07-20 ENCOUNTER — Encounter: Payer: Self-pay | Admitting: Podiatry

## 2016-07-20 ENCOUNTER — Ambulatory Visit (INDEPENDENT_AMBULATORY_CARE_PROVIDER_SITE_OTHER): Payer: Self-pay | Admitting: Podiatry

## 2016-07-20 DIAGNOSIS — Z9889 Other specified postprocedural states: Secondary | ICD-10-CM

## 2016-07-20 DIAGNOSIS — L03115 Cellulitis of right lower limb: Secondary | ICD-10-CM

## 2016-07-20 MED ORDER — SULFAMETHOXAZOLE-TRIMETHOPRIM 800-160 MG PO TABS: 1.0000 | ORAL_TABLET | Freq: Two times a day (BID) | ORAL | 0 refills | Status: DC

## 2016-07-20 MED ORDER — OXYCODONE-ACETAMINOPHEN 5-325 MG PO TABS: 1.0000 | ORAL_TABLET | Freq: Four times a day (QID) | ORAL | 0 refills | Status: DC | PRN

## 2016-07-20 NOTE — Progress Notes (Signed)
   Subjective:  Patient presents today status post right foot surgery. DOS: 07/09/16. Patient reports she is doing okay however she is experiencing significant pain to the dorsum of the right foot. She has been elevating, icing and resting the foot. Patient is concern now for possible infection. She states that the redness and swelling has not improved over the past week.    Objective/Physical Exam Skin incisions appear to be well coapted with sutures and staples intact. No sign of infectious process noted. No dehiscence. No active bleeding noted. Moderate edema noted to the surgical extremity with erythema.  Assessment: 1. s/p right foot surgery DOS: 07/09/16 2. Postoperative cellulitis right foot   Plan of Care:  1. Patient was evaluated. 2. Prescription for Bactrim DS 3. Prescription for Percocet 5/325mg  4. Dressings were changed. 5. Return to clinic in 1 week  Edrick Kins, DPM [facility]  Dr. Edrick Kins, [place] [place]                                        [place], [place] [address]                Office [phone]  Fax [phone]

## 2016-07-21 MED ORDER — FLUCONAZOLE 150 MG PO TABS: 150.0000 mg | ORAL_TABLET | Freq: Once | ORAL | 0 refills | Status: AC

## 2016-07-22 ENCOUNTER — Encounter: Payer: Medicaid Other | Admitting: Podiatry

## 2016-07-27 ENCOUNTER — Ambulatory Visit (INDEPENDENT_AMBULATORY_CARE_PROVIDER_SITE_OTHER): Payer: Medicaid Other

## 2016-07-27 ENCOUNTER — Ambulatory Visit (INDEPENDENT_AMBULATORY_CARE_PROVIDER_SITE_OTHER): Payer: Self-pay | Admitting: Podiatry

## 2016-07-27 DIAGNOSIS — Z9889 Other specified postprocedural states: Secondary | ICD-10-CM

## 2016-07-27 DIAGNOSIS — M2011 Hallux valgus (acquired), right foot: Secondary | ICD-10-CM

## 2016-07-27 NOTE — Progress Notes (Signed)
   Subjective:  Patient presents today status post right foot surgery. DOS: 07/09/16. Patient reports she is still experiencing pain in the dorsum of the right foot.   Objective/Physical Exam Skin incisions appear to be well coapted with sutures and staples intact. No sign of infectious process noted. No dehiscence. No active bleeding noted. Moderate edema noted to the surgical extremity with erythema.  Radiographic Exam: Stable osteotomy sites noted. Orthopedic hardware intact. Evidence of routine healing.  Assessment: 1. s/p right foot surgery DOS: 07/09/16 2. Postoperative cellulitis right foot   Plan of Care:  1. Patient was evaluated. 2. Staples removed. 3. Dry sterile dressing applied. 4. Continue Postop shoe. 5. Return to clinic in 10 days for percutaneous pin removal.  Edrick Kins, DPM [facility]  Dr. Edrick Kins, [place]                                        [place], [place] [address]                Office [phone]  Fax [phone]

## 2016-07-30 ENCOUNTER — Encounter: Payer: Self-pay | Admitting: Internal Medicine

## 2016-07-31 ENCOUNTER — Encounter: Payer: Self-pay | Admitting: Internal Medicine

## 2016-08-03 ENCOUNTER — Encounter: Payer: Self-pay | Admitting: Internal Medicine

## 2016-08-04 ENCOUNTER — Other Ambulatory Visit: Payer: Self-pay | Admitting: Internal Medicine

## 2016-08-04 ENCOUNTER — Telehealth: Payer: Self-pay | Admitting: Internal Medicine

## 2016-08-04 MED ORDER — TRAZODONE HCL 150 MG PO TABS: 150.0000 mg | ORAL_TABLET | Freq: Every day | ORAL | 0 refills | Status: DC

## 2016-08-04 NOTE — Progress Notes (Signed)
Patient ID: Carla Bell, female   DOB: 29-Dec-1960, 56 y.o.   MRN: [id number] 1. Cheilectomy first metatarsal-phalangeal joint right. 2. Hammertoe repair right foot 2nd digit.  3. Possible metatarsal osteotomy 2nd right foot.

## 2016-08-04 NOTE — Telephone Encounter (Signed)
Pt calling to get a refill of her sleep meds, states that she has been out for approx 3 days and just had major foot surgery and can't drive. Would like to have scripts sent to [facility] in [place]. Please f/u.

## 2016-08-04 NOTE — Telephone Encounter (Signed)
Schedule appointment with Dr. Wynetta Emery

## 2016-08-04 NOTE — Telephone Encounter (Signed)
I renewed trazodone 150 qhs, 1 month supply. Needs f/u appt for further refills.

## 2016-08-04 NOTE — Telephone Encounter (Signed)
Will defer to Dr. Janne Napoleon

## 2016-08-05 ENCOUNTER — Ambulatory Visit (INDEPENDENT_AMBULATORY_CARE_PROVIDER_SITE_OTHER): Payer: Medicaid Other

## 2016-08-05 ENCOUNTER — Ambulatory Visit (INDEPENDENT_AMBULATORY_CARE_PROVIDER_SITE_OTHER): Payer: Self-pay | Admitting: Podiatry

## 2016-08-05 DIAGNOSIS — M2041 Other hammer toe(s) (acquired), right foot: Secondary | ICD-10-CM | POA: Diagnosis not present

## 2016-08-05 DIAGNOSIS — Z9889 Other specified postprocedural states: Secondary | ICD-10-CM

## 2016-08-05 NOTE — Telephone Encounter (Signed)
-----   Message from Carla Bell, [place] sent at 08/05/2016  9:36 AM EDT ----- Pt would like Physical Therapy to be done in [place], if possible.  Thanks

## 2016-08-05 NOTE — Telephone Encounter (Signed)
-----   Message from Tomi Bamberger, [place] sent at 08/05/2016  9:34 AM EDT ----- Please order PT 3x per week x 4 weeks.  Dx-s/p bunion, hammertoe Right foot DOS - 07/09/16 Thanks.

## 2016-08-06 NOTE — Progress Notes (Signed)
   Subjective:  Patient presents today status post right foot surgery. DOS: 07/09/16. She states she is still experiencing a burning sensation and numbness in the right foot.   Objective/Physical Exam Skin incisions appear to be well coapted. No sign of infectious process noted. No dehiscence. No active bleeding noted. Moderate edema noted to the surgical extremity with erythema.  Radiographic Exam: Stable osteotomy sites noted. Orthopedic hardware intact. Evidence of routine healing.  Assessment: 1. s/p bunionectomy and second hammertoe repair of the right foot DOS: 07/09/16  Plan of Care:  1. Patient was evaluated. X-rays reviewed. 2. Percutaneous pin removed. 3. Begin showering in 2 days. 4. Orders for physical therapy in [place], [place]. 5. Return to clinic in 4 weeks.  Edrick Kins, DPM [facility]  Dr. Edrick Kins, [place]                                        [place], [place] [address]                Office [phone]  Fax [phone]

## 2016-08-07 ENCOUNTER — Telehealth: Payer: Self-pay | Admitting: *Deleted

## 2016-08-07 DIAGNOSIS — Z9889 Other specified postprocedural states: Secondary | ICD-10-CM

## 2016-08-07 DIAGNOSIS — M2011 Hallux valgus (acquired), right foot: Secondary | ICD-10-CM

## 2016-08-07 DIAGNOSIS — M2041 Other hammer toe(s) (acquired), right foot: Secondary | ICD-10-CM

## 2016-08-07 NOTE — Telephone Encounter (Addendum)
Pt states [facility] does not take Medicaid. I explained to pt that hospital PT, will take Medicaid. Pt states closes hospital is [facility]. I spoke with [facility] and he states they accepted Medicaid, but after initial evaluation, Medicaid requires a Prior Authorization that may take 5-7 days, pt is sent home with home exercises. I told Antony Haste I understood and would fax our referral to their office. Faxed referral, LOV, and demographics to [facility] [phone], fax [phone]/03/2016-Pt states Dr. Amalia Hailey was to send rx for Trazodone to [facility] in [place], but it is not at the pharmacy. Called Trazodone rx order of 09/02/2016 to [facility]. Pt states the Trazodone is not at the [facility] in [place]. I left message informing pt I left message with the Trazodone order on the [facility] on [address] in [place] 1:39pm today.

## 2016-08-13 NOTE — Telephone Encounter (Signed)
I believe something is going on with the hammertoe I had. There is another blister underneath of it and it is very sore.

## 2016-08-14 ENCOUNTER — Encounter: Payer: Self-pay | Admitting: Podiatry

## 2016-08-14 ENCOUNTER — Ambulatory Visit (INDEPENDENT_AMBULATORY_CARE_PROVIDER_SITE_OTHER): Payer: Medicaid Other | Admitting: Podiatry

## 2016-08-14 DIAGNOSIS — Z9889 Other specified postprocedural states: Secondary | ICD-10-CM

## 2016-08-14 DIAGNOSIS — M2041 Other hammer toe(s) (acquired), right foot: Secondary | ICD-10-CM

## 2016-08-14 DIAGNOSIS — M2011 Hallux valgus (acquired), right foot: Secondary | ICD-10-CM

## 2016-08-14 MED ORDER — MUPIROCIN 2 % EX OINT: 1.0000 "application " | TOPICAL_OINTMENT | Freq: Two times a day (BID) | CUTANEOUS | 2 refills | Status: DC

## 2016-08-20 NOTE — Progress Notes (Signed)
Subjective: Carla Bell is a 56 y.o. is seen today in office s/p right cheilectomy and 2nd metatarsal osteotomy with hammertoe repair preformed on 07/09/2016 with Dr. Amalia Hailey. She presents today with concerns of a recurrent blister to the tip of the right second toe. She states that his been trying physical therapy and she's tried get some mobility back but every time she does she starts to develop a blister underneath the tip of the right second toe. She denies any drainage or pus. He denies any increase in swelling. She has finished her course of antibiotics. Denies any systemic complaints such as fevers, chills, nausea, vomiting. No calf pain, chest pain, shortness of breath.   Objective: General: No acute distress, AAOx3  DP/PT pulses palpable 2/4, CRT < 3 sec to all digits.  Protective sensation intact. Motor function intact.  Right foot: Incision is well coapted without any evidence of dehiscence. The distal aspect of the right second toe plantarly is what appears to be an old hemorrhagic blister. There is no drainage or pus there is no swelling erythema, ascending cellulitis. There is no fluctuance, crepitus, malodor. No clinical signs of infection. The second toe does sit in a slightly plantarflexed position compared to the other digits.  No other areas of tenderness to bilateral lower extremities.  No other open lesions or pre-ulcerative lesions.  No pain with calf compression, swelling, warmth, erythema.   Assessment and Plan:  Status post right foot surgery, doing well with no complications   -Treatment options discussed including all alternatives, risks, and complications -I lightly debrided some of the hyperkeratotic tissue from the old blister. Recommended and about ointment dressing changes daily. I did dispense a splint to help hold the toe in a more dorsiflexed, rectus position to help take pressure off the toe is up with [place] blisters more from friction. -Continue physical  therapy. -Ice/elevation -Pain medication as needed. -Monitor for any clinical signs or symptoms of infection and DVT/PE and directed to call the office immediately should any occur or go to the ER. -Follow-up in 10 days with Dr. Amalia Hailey or sooner if any problems arise. In the meantime, encouraged to call the office with any questions, concerns, change in symptoms.   Celesta Gentile, DPM

## 2016-08-27 NOTE — Telephone Encounter (Signed)
Addendum: Samples for Spiriva Resp 2.29mcg were placed up front to pick up 06/16/16. These were never picked up.  Samples have been logged back in and put back on the shelf. Nothing further needed.

## 2016-09-02 ENCOUNTER — Ambulatory Visit (INDEPENDENT_AMBULATORY_CARE_PROVIDER_SITE_OTHER): Payer: Medicaid Other

## 2016-09-02 ENCOUNTER — Ambulatory Visit (INDEPENDENT_AMBULATORY_CARE_PROVIDER_SITE_OTHER): Payer: Medicaid Other | Admitting: Podiatry

## 2016-09-02 DIAGNOSIS — M2041 Other hammer toe(s) (acquired), right foot: Secondary | ICD-10-CM | POA: Diagnosis not present

## 2016-09-02 DIAGNOSIS — M2011 Hallux valgus (acquired), right foot: Secondary | ICD-10-CM

## 2016-09-02 DIAGNOSIS — Z9889 Other specified postprocedural states: Secondary | ICD-10-CM

## 2016-09-02 MED ORDER — TRAZODONE HCL 150 MG PO TABS: 150.0000 mg | ORAL_TABLET | Freq: Every day | ORAL | 0 refills | Status: DC

## 2016-09-06 NOTE — Progress Notes (Signed)
   Subjective:  Patient presents today status post right foot surgery. DOS: 07/09/16. She states her condition has improved. She reports some continued numbness on the dorsum of the foot.   Objective/Physical Exam Skin incisions appear to be well coapted. No sign of infectious process noted. No dehiscence. No active bleeding noted. Moderate edema noted to the surgical extremity with erythema.  Radiographic Exam: Stable osteotomy sites noted. Orthopedic hardware intact. Evidence of routine healing.  Assessment: 1. s/p bunionectomy and second hammertoe repair of the right foot DOS: 07/09/16  Plan of Care:  1. Patient was evaluated. X-rays reviewed. 2. Prescription for trazodone 150 mg for insomnia due to foot surgery. 3. Compression anklet dispensed. 4. Return to clinic in 8 weeks.  Edrick Kins, DPM [facility]  Dr. Edrick Kins, [address]                                        [place], [place] [address]                Office [phone]  Fax [phone]

## 2016-09-23 ENCOUNTER — Ambulatory Visit (INDEPENDENT_AMBULATORY_CARE_PROVIDER_SITE_OTHER): Payer: Self-pay | Admitting: Podiatry

## 2016-09-23 ENCOUNTER — Telehealth: Payer: Self-pay | Admitting: Podiatry

## 2016-09-23 ENCOUNTER — Ambulatory Visit (INDEPENDENT_AMBULATORY_CARE_PROVIDER_SITE_OTHER): Payer: Medicaid Other

## 2016-09-23 DIAGNOSIS — Z9889 Other specified postprocedural states: Secondary | ICD-10-CM

## 2016-09-23 DIAGNOSIS — M2011 Hallux valgus (acquired), right foot: Secondary | ICD-10-CM | POA: Diagnosis not present

## 2016-09-23 MED ORDER — DICLOFENAC SODIUM 75 MG PO TBEC: 75.0000 mg | DELAYED_RELEASE_TABLET | Freq: Two times a day (BID) | ORAL | 0 refills | Status: DC

## 2016-09-23 MED ORDER — GABAPENTIN 100 MG PO CAPS: 100.0000 mg | ORAL_CAPSULE | Freq: Three times a day (TID) | ORAL | 0 refills | Status: DC

## 2016-09-23 NOTE — Telephone Encounter (Signed)
I saw Dr. Amalia Hailey this morning and he gave me an injection. It has done wore off and I'm in a lot of pain. I was wondering if he could prescribe me some medication.

## 2016-09-23 NOTE — Telephone Encounter (Signed)
Yes please prescribe Vicodin 5/325mg  #30. She can stop by the office and pick it up. Thanks, Dr. Amalia Hailey

## 2016-09-24 ENCOUNTER — Telehealth: Payer: Self-pay | Admitting: Podiatry

## 2016-09-24 MED ORDER — HYDROCODONE-ACETAMINOPHEN 5-325 MG PO TABS: 1.0000 | ORAL_TABLET | Freq: Four times a day (QID) | ORAL | 0 refills | Status: DC | PRN

## 2016-09-24 NOTE — Telephone Encounter (Signed)
Hi, this is Lucila Maine. Can you please call me back at [phone].

## 2016-09-24 NOTE — Telephone Encounter (Addendum)
Left message informing pt DR. Evans had ordered pain medication and she could pick up in the [place] office today before 5:00pm. Left message informing pt she could pick up the rx in the [place] office before 5:00pm.

## 2016-09-24 NOTE — Telephone Encounter (Signed)
This is Carla Bell. Somebody just called here from the doctors office and I am returning your phone call.

## 2016-09-24 NOTE — Telephone Encounter (Signed)
I called pt back to make sure this was the number that called and left a message due to static on the phone line while during the message. Pt stated she thinks someone from our office has tried calling her but her home number was not getting good service. I spoke with our triage RN, Marcy Siren and she stated she called the pt twice today and left a message that pt's new Rx is up front and can be picked up by 5:00 pm today and if she is unable to come today, we open at 7:30 am tomorrow. Pt stated her husband would be by to pick up the Rx. I told her that was fine as long as he provided up his picture ID.

## 2016-10-04 NOTE — Progress Notes (Signed)
   Subjective:  Patient presents today status post right foot surgery. DOS: 07/09/16. Patient underwent bunionectomy with second hammertoe repair of the right foot. She states that she's still having a lot of pain on the dorsal aspect of the right foot. She experiences a sharp burning sensation.   Objective/Physical Exam Skin incisions appear to be well coapted. There is a moderate edema noted to the right lower extremity localized to the dorsum of the foot. There is also diffuse pain on palpation throughout the right forefoot.  Radiographic Exam: Stable osteotomy sites noted. Orthopedic hardware intact. Evidence of routine healing.  Assessment: 1. s/p bunionectomy and second hammertoe repair of the right foot DOS: 07/09/16  Plan of Care:  1. Patient was evaluated. X-rays reviewed. 2. Prescription for diclofenac 75 mg 3. Prescription for gabapentin 100 mg to resolve possible neuritis of the surgical extremity 4. Continue wearing good shoe gear 5. Injection of 0.5 mL Celestone Soluspan injected in the second MTPJ right foot 6. Return to clinic in 4 weeks  Edrick Kins, DPM [facility]  Dr. Edrick Kins, [address]                                        [place], [place] [address]                Office [phone]  Fax [phone]

## 2016-10-07 ENCOUNTER — Telehealth: Payer: Self-pay | Admitting: Podiatry

## 2016-10-07 NOTE — Telephone Encounter (Signed)
This is Carla Bell calling. The gabapentin and the other antiinflammatory medication that Dr. Amalia Hailey prescribed has not worked at all. Now my foot is in more pain than it ever has been in. Please call me back at [phone].

## 2016-10-09 MED ORDER — METHYLPREDNISOLONE 4 MG PO TBPK: ORAL_TABLET | ORAL | 0 refills | Status: DC

## 2016-10-09 NOTE — Telephone Encounter (Addendum)
Unable to leave Dr. Amalia Hailey message for pt, voicemail service not set up for mobile phone, and customer not accepting in coming calls at this time on home phone. 10/12/2016-I spoke with pt concerning Dr. 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 I told her I would order the medrol dose pack and she would begin that and I would have the scheduler make her a earlier appt then 10/28/2016, that she should go back in to the surgery boot, and once completed the medrol dose pack, begin the diclofenac. Pt states understanding, and transferred to schedulers.Pt present to office stating she has an appt but she wants something for pain.Dr. Amalia Hailey ordered refill percocet as previously.

## 2016-10-09 NOTE — Addendum Note (Signed)
Addended by: Harriett Sine D on: 10/09/2016 02:30 PM   Modules accepted: Orders

## 2016-10-09 NOTE — Telephone Encounter (Signed)
Please order a medrol dose pak for patient. Discontinue gabapentin. Resume diclofenac after completion the Medrol Dosepak. If The patient still has their surgical cam boot request of the patient wear the boot until next appointment. Thanks, Dr. Amalia Hailey

## 2016-10-12 MED ORDER — OXYCODONE-ACETAMINOPHEN 5-325 MG PO TABS: 1.0000 | ORAL_TABLET | Freq: Four times a day (QID) | ORAL | 0 refills | Status: DC | PRN

## 2016-10-12 NOTE — Addendum Note (Signed)
Addended by: Harriett Sine D on: 10/12/2016 03:41 PM   Modules accepted: Orders

## 2016-10-14 ENCOUNTER — Ambulatory Visit (INDEPENDENT_AMBULATORY_CARE_PROVIDER_SITE_OTHER): Payer: Medicaid Other | Admitting: Podiatry

## 2016-10-14 ENCOUNTER — Ambulatory Visit: Payer: Medicaid Other

## 2016-10-14 ENCOUNTER — Encounter: Payer: Self-pay | Admitting: Podiatry

## 2016-10-14 DIAGNOSIS — Z9889 Other specified postprocedural states: Secondary | ICD-10-CM | POA: Diagnosis not present

## 2016-10-14 DIAGNOSIS — G5791 Unspecified mononeuropathy of right lower limb: Secondary | ICD-10-CM

## 2016-10-16 ENCOUNTER — Ambulatory Visit: Payer: Medicaid Other | Admitting: Family Medicine

## 2016-10-16 NOTE — Progress Notes (Signed)
   HPI: 56 year old female presents today for follow-up evaluation treatment of a bunionectomy with second hammertoe repair of the right foot. Date of surgery 07/09/2016. Patient states that she is still having severe pain to her right lower extremity. The pain begins on her right foot and extends proximal to her leg and posterior thigh at the injection site which was received for the popliteal block for preoperative anesthesia. Patient states that the gabapentin, diclofenac, and Percocet do not help to alleviate any pain. Patient states that today the pain is even worse. She presents today in tears and very distressed.  Past Medical History:  Diagnosis Date  . Asthma   . Cancer ([place])   . COPD (chronic obstructive pulmonary disease) ([place])   . Pericardial effusion 07/09/2015  . Pleuritic chest pain 07/09/2015     Physical Exam: General: The patient is alert and oriented x3 in no acute distress.  Dermatology: Skin is warm, dry and supple bilateral lower extremities. Negative for open lesions or macerations.Surgical incision sites are completely healed and resolved. No hypertrophic scar formation is noted.  Vascular: Palpable pedal pulses bilaterally. No edema or erythema noted. Capillary refill within normal limits.  Neurological: Epicritic and protective threshold grossly intact bilaterally. Paresthesia with numbness noted to the dorsum of the right forefoot  Musculoskeletal Exam: Range of motion within normal limits to all pedal and ankle joints bilateral. Muscle strength 5/5 in all groups bilateral. Severe pain on palpation noted throughout the right forefoot.  Radiographic Exam:  Patient declined x-rays today  Assessment: 1. Status post bunionectomy and second digit hammertoe repair right foot. Date of surgery 07/09/2016.   Plan of Care:  1. Patient was evaluated. 2. Today explained to the patient that she should not be having this much pain postoperatively. Especially 3 months after  surgery. There is a high suspicion of neuritis to the right lower extremity. However, the patient has not improved in pain despite oral anti-inflammatory and analgesics. 3. Today were going to order an MRI of the right foot due to increased pain. We will also order nerve conduction study right lower extremity to ensure there is no neurological deficit secondary to popliteal block anesthesia preoperatively 4. Orders for CBC and chemistry placed today 5. Return to clinic in 4 weeks to review results and discuss further treatment options   Edrick Kins, DPM [facility]  Dr. Edrick Kins, DPM    [address], [place] [address]                Office [phone]  Fax [phone]

## 2016-10-19 ENCOUNTER — Ambulatory Visit: Payer: Medicaid Other | Admitting: Family Medicine

## 2016-10-20 ENCOUNTER — Telehealth: Payer: Self-pay | Admitting: Podiatry

## 2016-10-20 NOTE — Telephone Encounter (Signed)
Last time I was there I was there I was told to go to [employer] at [facility] to have blood drawn. I've tried to contact them to see if I need to make an appointment with them but the number I have is not no good. I cannot find another number for them. So please call me back at [phone]. Thank you.

## 2016-10-20 NOTE — Telephone Encounter (Addendum)
I told pt that any of the [facility] numbers are difficult to get in touch with a person, but to go to the lab at her convenience and the orders were there.10/22/2016--Pt states [facility] rescheduled appt to 11/10/2016 and she would like to know if she could get another refill of the Percocet. Dr. Amalia Hailey ordered refill as previously. Pt to pick up in the [place].

## 2016-10-22 ENCOUNTER — Ambulatory Visit: Payer: Medicaid Other | Attending: Family Medicine | Admitting: Family Medicine

## 2016-10-22 ENCOUNTER — Encounter: Payer: Medicaid Other | Admitting: Neurology

## 2016-10-22 ENCOUNTER — Encounter: Payer: Self-pay | Admitting: Family Medicine

## 2016-10-22 VITALS — BP 118/83 | HR 91 | Temp 98.2°F | Resp 18 | Ht 69.0 in | Wt 200.0 lb

## 2016-10-22 DIAGNOSIS — Z79891 Long term (current) use of opiate analgesic: Secondary | ICD-10-CM | POA: Diagnosis not present

## 2016-10-22 DIAGNOSIS — G4700 Insomnia, unspecified: Secondary | ICD-10-CM | POA: Diagnosis not present

## 2016-10-22 DIAGNOSIS — Z111 Encounter for screening for respiratory tuberculosis: Secondary | ICD-10-CM | POA: Insufficient documentation

## 2016-10-22 DIAGNOSIS — Z201 Contact with and (suspected) exposure to tuberculosis: Secondary | ICD-10-CM | POA: Diagnosis not present

## 2016-10-22 DIAGNOSIS — Z79899 Other long term (current) drug therapy: Secondary | ICD-10-CM | POA: Insufficient documentation

## 2016-10-22 MED ORDER — TRAZODONE HCL 150 MG PO TABS: 150.0000 mg | ORAL_TABLET | Freq: Every day | ORAL | 0 refills | Status: DC

## 2016-10-22 MED ORDER — OXYCODONE-ACETAMINOPHEN 5-325 MG PO TABS: 1.0000 | ORAL_TABLET | Freq: Four times a day (QID) | ORAL | 0 refills | Status: DC | PRN

## 2016-10-22 NOTE — Patient Instructions (Signed)
You will be called with your labs results.

## 2016-10-22 NOTE — Addendum Note (Signed)
Addended by: Harriett Sine D on: 10/22/2016 04:47 PM   Modules accepted: Orders

## 2016-10-22 NOTE — Progress Notes (Signed)
Patient is here for referral  Or order for chest x ray   Patient needs refill on trazadone

## 2016-10-22 NOTE — Progress Notes (Signed)
Subjective:  Patient ID: Carla Bell, female    DOB: Aug 12, 1960  Age: 56 y.o. MRN: [id number]  CC: Establish Care   HPI Donnelle Rubey presents for screening or TB for employment. 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 History of reactive PPD. But negative She reports recent history of physical and brings paperwork with her. She is requesting x-ray to confirm negative TB status. Patient complains of anxious mood.  She has the following symptoms: insomnia. Onset of symptoms was approximately a few months ago, unchanged since that time. She denies current suicidal and homicidal ideation.  Possible organic causes contributing are: none. Previous treatment includes none . She declines speaking with LCSW. She requests medication for sleep. History of COPD. She reports being followed by pulmonology. She denies any increased inhaler use.   Outpatient Medications Prior to Visit  Medication Sig Dispense Refill  . albuterol (PROVENTIL) (2.5 MG/3ML) 0.083% nebulizer solution Take 3 mLs (2.5 mg total) by nebulization every 4 (four) hours as needed for wheezing or shortness of breath. 75 mL 11  . budesonide-formoterol (SYMBICORT) 160-4.5 MCG/ACT inhaler Take 2 puffs first thing in am and then another 2 puffs about 12 hours later. 1 Inhaler 11  . diclofenac (VOLTAREN) 75 MG EC tablet Take 1 tablet (75 mg total) by mouth 2 (two) times daily. 60 tablet 0  . gabapentin (NEURONTIN) 100 MG capsule Take 1 capsule (100 mg total) by mouth 3 (three) times daily. 90 capsule 0  . HYDROcodone-acetaminophen (NORCO/VICODIN) 5-325 MG tablet Take 1 tablet by mouth every 6 (six) hours as needed for moderate pain. 30 tablet 0  . ibuprofen (ADVIL,MOTRIN) 600 MG tablet Take 1 tablet (600 mg total) by mouth every 8 (eight) hours as needed. 30 tablet 1  . meloxicam (MOBIC) 15 MG tablet Take 15 mg by mouth daily.    . methylPREDNISolone (MEDROL) 4 MG TBPK tablet Take as  directed. 21 tablet 0  . mupirocin ointment (BACTROBAN) 2 % Apply 1 application topically 2 (two) times daily. 30 g 2  . oxyCODONE-acetaminophen (PERCOCET/ROXICET) 5-325 MG tablet Take 1 tablet by mouth every 6 (six) hours as needed for severe pain.    Marland Kitchen oxyCODONE-acetaminophen (ROXICET) 5-325 MG tablet Take 1 tablet by mouth every 6 (six) hours as needed for severe pain. 30 tablet 0  . oxyCODONE-acetaminophen (ROXICET) 5-325 MG tablet Take 1 tablet by mouth every 6 (six) hours as needed for severe pain. Do not drive or operate machinery while taking this medication. 30 tablet 0  . predniSONE (DELTASONE) 10 MG tablet Take  4 each am x 2 days,   2 each am x 2 days,  1 each am x 2 days and stop 14 tablet 0  . Tiotropium Bromide Monohydrate (SPIRIVA RESPIMAT) 2.5 MCG/ACT AERS 2 each am 1 Inhaler 11  . Tiotropium Bromide Monohydrate (SPIRIVA RESPIMAT) 2.5 MCG/ACT AERS Inhale 2 puffs into the lungs daily. 2 Inhaler 0  . traMADol (ULTRAM) 50 MG tablet Take 1 tablet (50 mg total) by mouth every 8 (eight) hours as needed. 60 tablet 0  . traZODone (DESYREL) 150 MG tablet Take 1 tablet (150 mg total) by mouth at bedtime. 30 tablet 0   Facility-Administered Medications Prior to Visit  Medication Dose Route Frequency Provider Last Rate Last Dose  . betamethasone acetate-betamethasone sodium phosphate (CELESTONE) injection 3 mg  3 mg Intramuscular Once Daylene Katayama M, DPM      . betamethasone acetate-betamethasone sodium  phosphate (CELESTONE) injection 3 mg  3 mg Intramuscular Once Edrick Kins, DPM        ROS Review of Systems  Constitutional: Negative.   Eyes: Negative.   Respiratory: Negative.   Cardiovascular: Negative.   Gastrointestinal: Negative.   Psychiatric/Behavioral: Positive for sleep disturbance. The patient is nervous/anxious.        Objective:  BP 118/83 (BP Location: Left Arm, Patient Position: Sitting, Cuff Size: Normal)   Pulse 91   Temp 98.2 F (36.8 C) (Oral)   Resp 18    Ht 5\' 9"  (1.753 m)   Wt 200 lb (90.7 kg)   SpO2 91%   BMI 29.53 kg/m   BP/Weight 10/22/2016 06/05/2016 4/54/0981  Systolic BP 191 478 -  Diastolic BP 83 76 -  Wt. (Lbs) 200 197.4 197  BMI 29.53 29.15 29.09     Physical Exam  Constitutional: She appears well-developed and well-nourished.  HENT:  Head: Normocephalic and atraumatic.  Right Ear: External ear normal.  Left Ear: External ear normal.  Nose: Nose normal.  Mouth/Throat: Oropharynx is clear and moist.  Eyes: Pupils are equal, round, and reactive to light. Conjunctivae are normal.  Neck: Normal range of motion. Neck supple.  Cardiovascular: Normal rate, regular rhythm, normal heart sounds and intact distal pulses.   Pulmonary/Chest: Effort normal and breath sounds normal.  Abdominal: Soft. Bowel sounds are normal. There is no tenderness.  Lymphadenopathy:    She has no cervical adenopathy.  Skin: Skin is warm and dry.  Psychiatric: Her mood appears anxious. She expresses no homicidal and no suicidal ideation. She expresses no suicidal plans and no homicidal plans.  Nursing note and vitals reviewed.    Assessment & Plan:   Problem List Items Addressed This Visit    None    Visit Diagnoses    Screening for tuberculosis    -  Primary   Relevant Orders   Quantiferon tb gold assay (Completed)   DG Chest 2 View   History of tuberculosis exposure       Relevant Orders   Quantiferon tb gold assay (Completed)   DG Chest 2 View   Insomnia, unspecified type       Relevant Medications   traZODone (DESYREL) 150 MG tablet      Meds ordered this encounter  Medications  . traZODone (DESYREL) 150 MG tablet    Sig: Take 1 tablet (150 mg total) by mouth at bedtime.    Dispense:  30 tablet    Refill:  0    Order Specific Question:   Supervising Provider    Answer:   Tresa Garter [id number]    Follow-up: Return As needed.   Alfonse Spruce FNP

## 2016-10-25 LAB — QUANTIFERON IN TUBE
QFT TB AG MINUS NIL VALUE: <0 IU/mL
QUANTIFERON NIL VALUE: 0.09 [IU]/mL
QUANTIFERON TB AG VALUE: 0.08 [IU]/mL
QUANTIFERON TB GOLD: NEGATIVE

## 2016-10-25 LAB — QUANTIFERON TB GOLD ASSAY (BLOOD)

## 2016-10-28 ENCOUNTER — Ambulatory Visit: Payer: Medicaid Other | Admitting: Podiatry

## 2016-10-30 ENCOUNTER — Telehealth: Payer: Self-pay

## 2016-10-30 ENCOUNTER — Telehealth: Payer: Self-pay | Admitting: Podiatry

## 2016-10-30 NOTE — Telephone Encounter (Signed)
CMA cal regarding lab results   Patient did not answer somebody else did l left a message stating to call me back @ [facility]

## 2016-10-30 NOTE — Telephone Encounter (Signed)
-----   Message from Alfonse Spruce, [place] sent at 10/30/2016  2:09 PM EDT ----- TB Quantiferon test is negative.

## 2016-10-30 NOTE — Telephone Encounter (Signed)
Dr Amalia Hailey, would you like to refill her prescription

## 2016-10-30 NOTE — Telephone Encounter (Signed)
I would like a refill of the medication Percocet 5.

## 2016-10-31 NOTE — Telephone Encounter (Signed)
Yes, that's fine.  Dr. Michio Thier

## 2016-11-02 ENCOUNTER — Other Ambulatory Visit: Payer: Self-pay

## 2016-11-02 MED ORDER — OXYCODONE-ACETAMINOPHEN 5-325 MG PO TABS: 1.0000 | ORAL_TABLET | Freq: Four times a day (QID) | ORAL | 0 refills | Status: DC | PRN

## 2016-11-09 ENCOUNTER — Ambulatory Visit (HOSPITAL_COMMUNITY)
Admission: RE | Admit: 2016-11-09 | Discharge: 2016-11-09 | Disposition: A | Discharge status: Home / Self Care | Payer: Medicaid Other | Source: Ambulatory Visit | Attending: Family Medicine | Admitting: Family Medicine

## 2016-11-09 DIAGNOSIS — J449 Chronic obstructive pulmonary disease, unspecified: Secondary | ICD-10-CM | POA: Insufficient documentation

## 2016-11-09 DIAGNOSIS — Z87891 Personal history of nicotine dependence: Secondary | ICD-10-CM | POA: Insufficient documentation

## 2016-11-09 DIAGNOSIS — Z111 Encounter for screening for respiratory tuberculosis: Secondary | ICD-10-CM | POA: Diagnosis not present

## 2016-11-09 DIAGNOSIS — I700 Atherosclerosis of aorta: Secondary | ICD-10-CM | POA: Diagnosis not present

## 2016-11-09 DIAGNOSIS — Z201 Contact with and (suspected) exposure to tuberculosis: Secondary | ICD-10-CM | POA: Insufficient documentation

## 2016-11-10 ENCOUNTER — Ambulatory Visit (INDEPENDENT_AMBULATORY_CARE_PROVIDER_SITE_OTHER): Payer: Medicaid Other | Admitting: Neurology

## 2016-11-10 DIAGNOSIS — G5701 Lesion of sciatic nerve, right lower limb: Secondary | ICD-10-CM | POA: Diagnosis not present

## 2016-11-10 LAB — CBC WITH DIFFERENTIAL/PLATELET
BASOS ABS: 0 {cells}/uL (ref 0–200)
Basophils Relative: 0 %
EOS ABS: 86 {cells}/uL (ref 15–500)
Eosinophils Relative: 1 %
HCT: 36.7 % (ref 35.0–45.0)
Hemoglobin: 12.1 g/dL (ref 11.7–15.5)
LYMPHS ABS: 1204 {cells}/uL (ref 850–3900)
Lymphocytes Relative: 14 %
MCH: 30.6 pg (ref 27.0–33.0)
MCHC: 33 g/dL (ref 32.0–36.0)
MCV: 92.7 fL (ref 80.0–100.0)
MONO ABS: 430 {cells}/uL (ref 200–950)
MPV: 9.9 fL (ref 7.5–12.5)
Monocytes Relative: 5 %
NEUTROS PCT: 80 %
Neutro Abs: 6880 cells/uL (ref 1500–7800)
Platelets: 272 10*3/uL (ref 140–400)
RBC: 3.96 MIL/uL (ref 3.80–5.10)
RDW: 13.5 % (ref 11.0–15.0)
WBC: 8.6 10*3/uL (ref 3.8–10.8)

## 2016-11-10 NOTE — Procedures (Signed)
[facility]  [place], [place]  [place], [place] [address] Tel: [phone] Fax:  [phone] Test Date:  11/10/2016  Patient: Carla Bell DOB: 02/19/1961 Physician: Narda Amber, DO  Sex: Female Height: 5\' 9"  Ref Phys: Daylene Katayama, DPM  ID#: [id number] Temp: 34.9 Technician:    Patient Complaints: This is a 56 year old female with history of right foot surgery in April 2018 during which time she received popliteal nerve block referred for evaluation of persistent right leg pain, paresthesias, and foot weakness.  NCV & EMG Findings: Extensive electrodiagnostic testing of the right lower extremity and additional studies of the left shows:  1. Right superficial peroneal sensory response is absent. Right sural sensory response is within normal limits, however asymmetrically reduced as compared to the left. 2. Right peroneal motor response at the extensor digitorum brevis shows reduced amplitude, and is normal at the tibialis anterior. Right tibial motor response shows low normal amplitude and asymmetrically reduced as compared to the left. 3. Right tibial H reflex study is within normal limits. 4. Chronic motor axon loss changes are seen affecting the tibialis anterior, flexor digitorum longus, fibularis longus, and medial gastrocnemius muscle. There is no evidence of accompanied active denervation and motor unit configuration and recruitment of the biceps femoris is within normal limits.  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   ___________________________ Narda Amber, DO    Nerve Conduction Studies Anti Sensory Summary Table   Site NR Peak (ms) Norm Peak (ms) P-T Amp (V) Norm P-T Amp  Left Sup Peroneal Anti Sensory (Ant Lat Mall)  34.9C  12 cm    2.6 <4.6 8.3 >4  Right Sup Peroneal  Anti Sensory (Ant Lat Mall)  34.9C  12 cm NR  <4.6  >4  Left Sural Anti Sensory (Lat Mall)  34.9C  Calf    3.3 <4.6 11.9 >4  Right Sural Anti Sensory (Lat Mall)  34.9C  Calf    3.3 <4.6 6.6 >4  Site 2    3.4  6.6    Motor Summary Table   Site NR Onset (ms) Norm Onset (ms) O-P Amp (mV) Norm O-P Amp Site1 Site2 Delta-0 (ms) Dist (cm) Vel (m/s) Norm Vel (m/s)  Left Peroneal Motor (Ext Dig Brev)  34.9C  Ankle    4.1 <6.0 4.1 >2.5 B Fib Ankle 8.0 38.0 48 >40  B Fib    12.1  4.1  Poplt B Fib 1.4 8.0 57 >40  Poplt    13.5  4.1         Right Peroneal Motor (Ext Dig Brev)  34.9C  Ankle    3.1 <6.0 2.2 >2.5 B Fib Ankle 8.5 40.0 47 >40  B Fib    11.6  2.1  Poplt B Fib 1.3 8.0 62 >40  Poplt    12.9  2.0         Left Peroneal TA Motor (Tib Ant)  34.9C  Fib Head    3.7 <4.5 3.3 >3 Poplit Fib Head 1.0 8.0 80 >40  Poplit    4.7  3.3         Right Peroneal TA Motor (Tib Ant)  34.9C  Fib Head    3.0 <4.5 4.2 >3 Poplit Fib Head 1.5 8.0 53 >40  Poplit    4.5  4.1  Left Tibial Motor (Abd Hall Brev)  34.9C  Ankle    2.9 <6.0 11.4 >4 Knee Ankle 9.6 42.0 44 >40  Knee    12.5  6.6         Right Tibial Motor (Abd Hall Brev)  34.9C  Ankle    3.4 <6.0 4.5 >4 Knee Ankle 9.2 42.0 46 >40  Knee    12.6  3.4          H Reflex Studies   NR H-Lat (ms) Lat Norm (ms) L-R H-Lat (ms)  Right Tibial (Gastroc)  34.9C     32.79 <35    EMG   Side Muscle Ins Act Fibs Psw Fasc Number Recrt Dur Dur. Amp Amp. Poly Poly. Comment  Right AntTibialis Nml Nml Nml Nml 1- Mod-R Few 1+ Few 1+ Nml Nml N/A  Right FlexDigLong Nml Nml Nml Nml 2- Mod-R Some 1+ Nml Nml Nml Nml N/A  Right Fibularis Long Nml Nml Nml Nml 3- Mod-R Some 1+ Some 1+ Nml Nml N/A  Right Gastroc Nml Nml Nml Nml 1- Mod-R Few 1+ Few 1+ Nml Nml N/A  Right RectFemoris Nml Nml Nml Nml Nml Nml Nml Nml Nml Nml Nml Nml N/A  Right GluteusMed Nml Nml Nml Nml Nml Nml Nml Nml Nml Nml Nml Nml N/A  Right BicepsFemS Nml Nml Nml Nml Nml Nml Nml Nml Nml Nml  Nml Nml N/A      Waveforms:

## 2016-11-11 ENCOUNTER — Telehealth: Payer: Self-pay | Admitting: Family Medicine

## 2016-11-11 ENCOUNTER — Telehealth: Payer: Self-pay | Admitting: Podiatry

## 2016-11-11 DIAGNOSIS — Z9889 Other specified postprocedural states: Secondary | ICD-10-CM

## 2016-11-11 DIAGNOSIS — G5791 Unspecified mononeuropathy of right lower limb: Secondary | ICD-10-CM

## 2016-11-11 LAB — BASIC METABOLIC PANEL
BUN: 10 mg/dL (ref 7–25)
CO2: 29 mmol/L (ref 20–32)
Calcium: 9 mg/dL (ref 8.6–10.4)
Chloride: 104 mmol/L (ref 98–110)
Creat: 0.81 mg/dL (ref 0.50–1.05)
GLUCOSE: 96 mg/dL (ref 65–99)
POTASSIUM: 4.8 mmol/L (ref 3.5–5.3)
Sodium: 141 mmol/L (ref 135–146)

## 2016-11-11 MED ORDER — OXYCODONE-ACETAMINOPHEN 5-325 MG PO TABS: 1.0000 | ORAL_TABLET | Freq: Four times a day (QID) | ORAL | 0 refills | Status: DC | PRN

## 2016-11-11 NOTE — Addendum Note (Signed)
Addended by: Harriett Sine D on: 11/11/2016 04:37 PM   Modules accepted: Orders

## 2016-11-11 NOTE — Telephone Encounter (Signed)
I was wanting to know if I can get a refill of my pain medication. I had to see the neurologist yesterday and they stuck me a lot with needles and it has made my foot hurt.

## 2016-11-11 NOTE — Telephone Encounter (Signed)
Pt called to request a refill for traZODone (DESYREL) 150 MG tablet  She said that she is been taking this med for about 20 years an she tale 1 1/2 pills for her please  Let her know if can be auth this refill, please follow up

## 2016-11-11 NOTE — Telephone Encounter (Signed)
Dr.Evans states refill Percocet as previously. I informed pt and she states she will have to pick up tomorrow.

## 2016-11-11 NOTE — Telephone Encounter (Signed)
Thanks

## 2016-11-11 NOTE — Telephone Encounter (Signed)
Patient will need a neurology consult ordered. You can go ahead and place one if possible. Thanks, Dr. Amalia Hailey

## 2016-11-11 NOTE — Telephone Encounter (Signed)
Will forward to PCP since it is dose change

## 2016-11-12 ENCOUNTER — Other Ambulatory Visit: Payer: Self-pay | Admitting: Family Medicine

## 2016-11-12 DIAGNOSIS — G4700 Insomnia, unspecified: Secondary | ICD-10-CM

## 2016-11-12 MED ORDER — TRAZODONE HCL 100 MG PO TABS: 200.0000 mg | ORAL_TABLET | Freq: Every day | ORAL | 2 refills | Status: DC

## 2016-11-12 NOTE — Addendum Note (Signed)
Addended by: Harriett Sine D on: 11/12/2016 09:06 AM   Modules accepted: Orders

## 2016-11-12 NOTE — Telephone Encounter (Signed)
I informed pt of Dr. Amalia Hailey referral to [facility] and place my card and their [phone] with her rx to be picked up today.

## 2016-11-13 NOTE — Telephone Encounter (Signed)
Prescribed traZODone (DESYREL) 100 MG tablet take 2 tablets;  to total 200 mg dosage patient has been taking.

## 2016-11-13 NOTE — Telephone Encounter (Signed)
CMA call regarding medication refill already sent to pharmacy   Patient did not answer & unable to leave message

## 2016-11-17 NOTE — Telephone Encounter (Signed)
CMA call regarding x ray results   Patient verify DOB   Patient was aware and understood her result   Patient ask for her copy cma stated that she will leave it at front desk so she can pick it up   Patient was also aware of her medication refill sent to pharmacy

## 2016-11-17 NOTE — Telephone Encounter (Signed)
-----   Message from Alfonse Spruce, [place] sent at 11/13/2016 12:00 PM EDT ----- Chest x-ray negative for active or old TB infection.  Chronic changes associated with asthma and COPD only.

## 2016-11-18 ENCOUNTER — Ambulatory Visit (INDEPENDENT_AMBULATORY_CARE_PROVIDER_SITE_OTHER): Payer: Medicaid Other | Admitting: Podiatry

## 2016-11-18 ENCOUNTER — Encounter: Payer: Self-pay | Admitting: Podiatry

## 2016-11-18 ENCOUNTER — Encounter: Payer: Self-pay | Admitting: Neurology

## 2016-11-18 DIAGNOSIS — M5431 Sciatica, right side: Secondary | ICD-10-CM | POA: Diagnosis not present

## 2016-11-18 DIAGNOSIS — G629 Polyneuropathy, unspecified: Secondary | ICD-10-CM | POA: Diagnosis not present

## 2016-11-18 NOTE — Progress Notes (Signed)
   HPI: 56 year old female presents today for follow-up treatment and evaluation regarding right lower extremity pain and hypersensitivity with loss of motion. Patient was last seen 10/14/2016 at which time a nerve conduction study was ordered. Patient states the pain continues to be constant and she feels that her right lower extremity and leg is very heavy. Patient underwent bunion and hammer hammertoe corrective surgery to the right foot on 07/09/2016. She presents today for follow-up treatment and evaluation   Physical Exam: General: The patient is alert and oriented x3 in no acute distress.  Dermatology: Skin is warm, dry and supple bilateral lower extremities. Negative for open lesions or macerations.  Vascular: Palpable pedal pulses bilaterally. No edema or erythema noted. Capillary refill within normal limits.  Neurological: please see NCV with EMG  Musculoskeletal Exam: Range of motion within normal limits to all pedal and ankle joints bilateral. Muscle strength 5/5 in all groups bilateral with exception of the extensors of the right foot consistent with findings of the NCV exam.   Assessment: 1. Peripheral neuropathy right lower extremity   2. Neuritis right lower extremity   Plan of Care:  1. Patient was evaluated. nerve conduction studies were reviewed today  2. recommend that the patient follow-up with the [facility] Neurology clinic for further treatment and evaluation. At this point there is nothing more I can provide. Patient has healed complete resolution of the bunion and hammertoe deformity has been achieved with exception of the neurological symptoms she is been experiencing   3. Return to clinic in 3 months   Edrick Kins, DPM [facility]  Dr. Edrick Kins, DPM    [address], [place] [address]                Office [phone]  Fax [phone]

## 2016-11-25 ENCOUNTER — Telehealth: Payer: Self-pay | Admitting: Podiatry

## 2016-11-25 NOTE — Telephone Encounter (Signed)
Please prescribe the patient Demerol 50mg  #30 q6h prn severe pain.  Thanks, Dr. Amalia Hailey

## 2016-11-25 NOTE — Telephone Encounter (Signed)
I was calling to see if I can get a refill of pain medication. I was wanting to know if I can get something a little stronger as the nerve pain is getting worse.

## 2016-11-25 NOTE — Telephone Encounter (Signed)
I'm calling back because I still have not heard anything from [place] and I know your office closes at 5:00 pm correct. "Yes ma'am we do close at 5:00 pm. Marcy Siren stated she still has not received a response from Dr. Amalia Hailey." Oh, is he not there today? "Yes ma'am he is here today but he has been busy with pt's all day." Oh okay, thank you.

## 2016-11-25 NOTE — Telephone Encounter (Signed)
Pt called back wanting to know if the nurse got her message from this morning. I stated she did and that she is in the process of returning calls. I also told the pt that Marcy Siren, RN may be waiting on a response back from Dr. Amalia Hailey and that she will call her as soon as she can. Pt stated she understood.

## 2016-11-26 MED ORDER — MEPERIDINE HCL 50 MG PO TABS: 50.0000 mg | ORAL_TABLET | Freq: Four times a day (QID) | ORAL | 0 refills | Status: DC | PRN

## 2016-11-26 NOTE — Addendum Note (Signed)
Addended by: Harriett Sine D on: 11/26/2016 09:38 AM   Modules accepted: Orders

## 2016-11-26 NOTE — Telephone Encounter (Signed)
I informed pt Dr. Amalia Hailey had ordered Demerol 50Mg  #30 one every 6 hours prn severe foot pain. Pt states she will have her husband pick up.

## 2016-12-07 ENCOUNTER — Telehealth: Payer: Self-pay | Admitting: Podiatry

## 2016-12-07 NOTE — Telephone Encounter (Signed)
Pt called requesting a refill of percocet but a higher does than 5. Pt stated she doesn't want demerol again as it made her sick. States the percocet works but that the [employer] 5 is not strong enough.

## 2016-12-07 NOTE — Telephone Encounter (Signed)
I called earlier this morning about getting my pain medication refilled. My husband no longer works in [place] to pick it up for me, but he is on the way. Can you please call me back at [phone] and let me know if I can pick it up today or not.

## 2016-12-07 NOTE — Telephone Encounter (Signed)
Dr Amalia Hailey, please advise, do you want to prescribe a higher dose for Percocet?

## 2016-12-08 ENCOUNTER — Other Ambulatory Visit: Payer: Self-pay

## 2016-12-08 MED ORDER — OXYCODONE-ACETAMINOPHEN 10-325 MG PO TABS: 1.0000 | ORAL_TABLET | Freq: Four times a day (QID) | ORAL | 0 refills | Status: DC | PRN

## 2016-12-10 ENCOUNTER — Encounter: Payer: Self-pay | Admitting: Neurology

## 2016-12-10 ENCOUNTER — Ambulatory Visit (INDEPENDENT_AMBULATORY_CARE_PROVIDER_SITE_OTHER): Payer: Medicaid Other | Admitting: Neurology

## 2016-12-10 VITALS — BP 104/70 | HR 68 | Ht 69.0 in | Wt 184.5 lb

## 2016-12-10 DIAGNOSIS — M792 Neuralgia and neuritis, unspecified: Secondary | ICD-10-CM

## 2016-12-10 DIAGNOSIS — G5701 Lesion of sciatic nerve, right lower limb: Secondary | ICD-10-CM | POA: Diagnosis not present

## 2016-12-10 DIAGNOSIS — G8929 Other chronic pain: Secondary | ICD-10-CM

## 2016-12-10 DIAGNOSIS — M25561 Pain in right knee: Secondary | ICD-10-CM | POA: Diagnosis not present

## 2016-12-10 MED ORDER — NORTRIPTYLINE HCL 10 MG PO CAPS: ORAL_CAPSULE | ORAL | 3 refills | Status: DC

## 2016-12-10 NOTE — Patient Instructions (Addendum)
We will order MRI of your knee region to see if there is any nerve impingement  Start nortriptyline 10mg  at bedtime for 2 week, then increase to 2 tablet at bedtime  We will call you with the results and let you know the next step

## 2016-12-10 NOTE — Progress Notes (Signed)
[place] [facility] Clinic Note - Initial Visit   Date: 12/10/16  Carla Bell MRN: [id number] DOB: [birth year]   Dear Dr. Amalia Hailey:  Thank you for your kind referral of Carla Bell for consultation of right foot pain. Although her history is well known to you, please allow [place] to reiterate it for the purpose of our medical record. The patient was accompanied to the clinic by husband who also provides collateral information.     History of Present Illness: Carla Bell is a 56 y.o. right-handed Caucasian female with COPD, asthma, and history of R foot surgery presenting for evaluation of right foot pain.    She underwent bunionectomy and second hammertoe repair of the right foot on July 09, 2016. She had R popliteal block for her preoperative anesthesia.  Within 6 hours of her nerve block wearing off, she started having persistent numbness of the toes and pain over the lateral leg and dorsum of the foot.  Over the next two weeks, her pain intensified and became more shooting pain over the dorsum of the foot and needle-like sensation over the lateral lower leg and lower posterior/lateral thigh.  Pain is worse with climbing stairs, standing, and driving.  She has weakness with moving her toes and limited motion of her ankle.  She has not suffered any falls and walks unassisted, but feels like she should use a cane for gait support.  She also complains of swelling around the ankle and over the dorsum of the foot.  She was only able to complete 3 sessions of physical therapy due to insurance not covering more sessions.   She has tried gabapentin and diclofenac which did not provide any relief.  She takes percocet which gives some relief and allows her to function.    She underwent NCS/EMG of the right foot in August that I performed which showed a distal right sciatic neuropathy, with greater involvement of the peroneal nerve.  She is here for further  evaluation.  She has not worked in 5 years and is trying to seek disability.  Out-side paper records, electronic medical record, and images have been reviewed where available and summarized as:  NCS/EMG of the leg 11/10/2016:  The electrophysiologic findings are most consistent with a chronic right sciatic axonal neuropathy, distal to the biceps femoris muscles, and with greater fascicular involvement of the peroneal nerve.   Past Medical History:  Diagnosis Date  . Asthma   . Cancer ([place])   . COPD (chronic obstructive pulmonary disease) ([place])   . Pericardial effusion 07/09/2015  . Pleuritic chest pain 07/09/2015    Past Surgical History Bunionectomy and second hammertoe repair of the right foot   Medications:  Outpatient Encounter Prescriptions as of 12/10/2016  Medication Sig  . albuterol (PROVENTIL) (2.5 MG/3ML) 0.083% nebulizer solution Take 3 mLs (2.5 mg total) by nebulization every 4 (four) hours as needed for wheezing or shortness of breath.  . budesonide-formoterol (SYMBICORT) 160-4.5 MCG/ACT inhaler Take 2 puffs first thing in am and then another 2 puffs about 12 hours later.  . diclofenac (VOLTAREN) 75 MG EC tablet Take 1 tablet (75 mg total) by mouth 2 (two) times daily.  Marland Kitchen ibuprofen (ADVIL,MOTRIN) 600 MG tablet Take 1 tablet (600 mg total) by mouth every 8 (eight) hours as needed.  . meloxicam (MOBIC) 15 MG tablet Take 15 mg by mouth daily.  . meperidine (DEMEROL) 50 MG tablet Take 1 tablet (50 mg total) by mouth every 6 (six) hours as needed for  severe pain.  . mupirocin ointment (BACTROBAN) 2 % Apply 1 application topically 2 (two) times daily.  Marland Kitchen oxyCODONE-acetaminophen (PERCOCET) 10-325 MG tablet Take 1 tablet by mouth every 6 (six) hours as needed for pain.  Marland Kitchen oxyCODONE-acetaminophen (ROXICET) 5-325 MG tablet Take 1 tablet by mouth every 6 (six) hours as needed for severe pain.  . Tiotropium Bromide Monohydrate (SPIRIVA RESPIMAT) 2.5 MCG/ACT AERS 2 each am  . traZODone  (DESYREL) 100 MG tablet Take 2 tablets (200 mg total) by mouth at bedtime.  . [DISCONTINUED] gabapentin (NEURONTIN) 100 MG capsule Take 1 capsule (100 mg total) by mouth 3 (three) times daily.  . nortriptyline (PAMELOR) 10 MG capsule nortriptyline 10mg  at bedtime for 2 week, then increase to 2 tablet at bedtime  . [DISCONTINUED] HYDROcodone-acetaminophen (NORCO/VICODIN) 5-325 MG tablet Take 1 tablet by mouth every 6 (six) hours as needed for moderate pain.  . [DISCONTINUED] methylPREDNISolone (MEDROL) 4 MG TBPK tablet Take as directed.  . [DISCONTINUED] oxyCODONE-acetaminophen (PERCOCET/ROXICET) 5-325 MG tablet Take 1 tablet by mouth every 6 (six) hours as needed for severe pain.  . [DISCONTINUED] oxyCODONE-acetaminophen (ROXICET) 5-325 MG tablet Take 1 tablet by mouth every 6 (six) hours as needed for severe pain. Do not drive or operate machinery while taking this medication.  . [DISCONTINUED] oxyCODONE-acetaminophen (ROXICET) 5-325 MG tablet Take 1 tablet by mouth every 6 (six) hours as needed for severe pain.  . [DISCONTINUED] oxyCODONE-acetaminophen (ROXICET) 5-325 MG tablet Take 1 tablet by mouth every 6 (six) hours as needed for severe pain.  . [DISCONTINUED] predniSONE (DELTASONE) 10 MG tablet Take  4 each am x 2 days,   2 each am x 2 days,  1 each am x 2 days and stop  . [DISCONTINUED] Tiotropium Bromide Monohydrate (SPIRIVA RESPIMAT) 2.5 MCG/ACT AERS Inhale 2 puffs into the lungs daily.  . [DISCONTINUED] traMADol (ULTRAM) 50 MG tablet Take 1 tablet (50 mg total) by mouth every 8 (eight) hours as needed.   Facility-Administered Encounter Medications as of 12/10/2016  Medication  . betamethasone acetate-betamethasone sodium phosphate (CELESTONE) injection 3 mg  . betamethasone acetate-betamethasone sodium phosphate (CELESTONE) injection 3 mg     Allergies: No Known Allergies  Family History: Family History  Problem Relation Age of Onset  . Alpha-1 antitrypsin deficiency Mother   .  Alpha-1 antitrypsin deficiency Daughter     Social History: Social History  Substance Use Topics  . Smoking status: Former Smoker    Packs/day: 0.25    Years: 2.00    Types: Cigarettes    Quit date: 03/17/1979  . Smokeless tobacco: Never Used  . Alcohol use No   Social History   Social History Narrative   Lives with husband in a 2 story home.  Has 3 children.  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  Education: 2 years of college.    Review of Systems:  CONSTITUTIONAL: No fevers, chills, night sweats, or weight loss.   EYES: No visual changes or eye pain ENT: No hearing changes.  No history of nose bleeds.   RESPIRATORY: No cough, wheezing and shortness of breath.   CARDIOVASCULAR: Negative for chest pain, and palpitations.   GI: Negative for abdominal discomfort, blood in stools or black stools.  No recent change in bowel habits.   GU:  No history of incontinence.   MUSCLOSKELETAL: +history of joint pain or swelling.  No myalgias.   SKIN: Negative for lesions, rash, and itching.   HEMATOLOGY/ONCOLOGY: Negative for prolonged bleeding, bruising easily,  and swollen nodes.  No history of cancer.   ENDOCRINE: Negative for cold or heat intolerance, polydipsia or goiter.   PSYCH:  No depression or anxiety symptoms.   NEURO: As Above.   Vital Signs:  BP 104/70   Pulse 68   Ht 5\' 9"  (1.753 m)   Wt 184 lb 8 oz (83.7 kg)   SpO2 95%   BMI 27.25 kg/m    General Medical Exam:   General:  Well appearing, comfortable.   Eyes/ENT: see cranial nerve examination.   Neck: No masses appreciated.  Full range of motion without tenderness.  No carotid bruits. Respiratory:  Clear to auscultation, good air entry bilaterally.   Cardiac:  Regular rate and rhythm, no murmur.   Extremities:  R foot with healed incision over the dorsum   Skin:  No rashes or lesions.  Neurological Exam: MENTAL STATUS including orientation to time, place, person, recent and remote memory, attention  span and concentration, language, and fund of knowledge is normal.  Speech is not dysarthric.  CRANIAL NERVES: II:  No visual field defects.  Unremarkable fundi.   III-IV-VI: Pupils equal round and reactive to light.  Normal conjugate, extra-ocular eye movements in all directions of gaze.  No nystagmus.  No ptosis.   V:  Normal facial sensation.     VII:  Normal facial symmetry and movements.   VIII:  Normal hearing and vestibular function.    IX-X:  Normal palatal movement.   XI:  Normal shoulder shrug and head rotation.   XII:  Normal tongue strength and range of motion, no deviation or fasciculation.  MOTOR:  No atrophy, fasciculations or abnormal movements.  No pronator drift.  Tone is normal.    Right Upper Extremity:    Left Upper Extremity:    Deltoid  5/5   Deltoid  5/5   Biceps  5/5   Biceps  5/5   Triceps  5/5   Triceps  5/5   Wrist extensors  5/5   Wrist extensors  5/5   Wrist flexors  5/5   Wrist flexors  5/5   Finger extensors  5/5   Finger extensors  5/5   Finger flexors  5/5   Finger flexors  5/5   Dorsal interossei  5/5   Dorsal interossei  5/5   Abductor pollicis  5/5   Abductor pollicis  5/5   Tone (Ashworth scale)  0  Tone (Ashworth scale)  0   Right Lower Extremity:    Left Lower Extremity:    Hip flexors  5/5   Hip flexors  5/5   Hip extensors  5/5   Hip extensors  5/5   Knee flexors  5/5   Knee flexors  5/5   Knee extensors  5/5   Knee extensors  5/5   Eversion 5-/5  Eversion 5/5  Inversion 5-/5  Inversion 5/5  Dorsiflexors  5-/5   Dorsiflexors  5/5   Plantarflexors  5-/5   Plantarflexors  5/5   Toe extensors  4/5   Toe extensors  5/5   Toe flexors  4/5   Toe flexors  5/5   Tone (Ashworth scale)  0  Tone (Ashworth scale)  0   MSRs:  Right  Left brachioradialis 2+  brachioradialis 2+  biceps 2+  biceps 2+  triceps 2+  triceps 2+  patellar 2+  patellar 2+  ankle jerk 1+  ankle jerk 2+    Hoffman no  Hoffman no  plantar response down  plantar response down   SENSORY:  Reduced temperature, pin prick, and light touch over the superficial peroneal nerve distribution on the right side only.  COORDINATION/GAIT: Normal finger-to- nose-finger and heel-to-shin.  Intact rapid alternating movements bilaterally.  Able to rise from a chair without using arms.  Gait narrow based and stable. Tandem and stressed gait intact.    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  Patient informed that with symptom onset following her surgery, the nerve block may have caused irritation to the sciatic nerve or she may have developed a hematoma/neuroma adjacent to the nerve.  I specifically explained that her nerve injury was not due to surgical intervention of her bunionectomy and second toe repair and nerve injury, as nerve injury was not at the level of the ankle or foot.  I spent a significant time of the visit discussing the nerve anatomy of the lower extremity and how it relates to her symptoms. With her persistent symptoms, I offered imaging of the sciatic nerve popliteal fossa and distal thigh to assess for any structural pathology of the sciatic nerve.  Prognosis of neural recovery can take months and some deficits may be lasting, especially with respect to her sensory changes of the foot because her superficial peroneal sensory response was absent on nerve testing. The sural sensory response was preserved as well as her tibial motor response.  In the meantime, I will start her on nortriptyline 10mg  at bedtime for 2 week, then increase to 2 tablet at bedtime for pain. Patient was informed that our practice is non-opiate prescribing and ideally we can find nonnarcotic medication to control her symptoms.   Return to clinic in 3 months.  The duration of this appointment visit was 50 minutes of  face-to-face time with the patient.  Greater than 50% of this time was spent in counseling, explanation of diagnosis, planning of further management, and coordination of care.   Thank you for allowing me to participate in patient's care.  If I can answer any additional questions, I would be pleased to do so.    Sincerely,    Donika K. Posey Pronto, DO

## 2016-12-11 ENCOUNTER — Other Ambulatory Visit: Payer: Self-pay | Admitting: *Deleted

## 2016-12-11 ENCOUNTER — Telehealth: Payer: Self-pay | Admitting: *Deleted

## 2016-12-11 DIAGNOSIS — G8929 Other chronic pain: Secondary | ICD-10-CM

## 2016-12-11 DIAGNOSIS — G5701 Lesion of sciatic nerve, right lower limb: Secondary | ICD-10-CM

## 2016-12-11 DIAGNOSIS — M792 Neuralgia and neuritis, unspecified: Secondary | ICD-10-CM

## 2016-12-11 DIAGNOSIS — M25561 Pain in right knee: Secondary | ICD-10-CM

## 2016-12-11 NOTE — Telephone Encounter (Signed)
Patient is willing to come to [facility] for MRI.  Referral sent.

## 2016-12-11 NOTE — Telephone Encounter (Signed)
-----   Message from Alda Berthold, DO sent at 12/10/2016  4:07 PM EDT ----- Regarding: MRI order Carla Bell,   Please order MRI RIGHT Knee with and without contrast In comments:  "H/o sciatic nerve block for foot surgery complicated by sciatic neuropathy.  Please evaluate distal thigh and popliteal fossa for sciatic nerve pathology.  Discussed with Dr. Candise Che."  If patient is willing to come to [place] - they can do this on 3T MRI which gives better imaging of the nerves, but you'll need to order it there.  If not, okay to order at [facility].  Thanks,  [employer]

## 2016-12-16 ENCOUNTER — Telehealth: Payer: Self-pay | Admitting: *Deleted

## 2016-12-16 MED ORDER — MEPERIDINE HCL 50 MG PO TABS: 50.0000 mg | ORAL_TABLET | Freq: Four times a day (QID) | ORAL | 0 refills | Status: DC | PRN

## 2016-12-16 MED ORDER — OXYCODONE-ACETAMINOPHEN 10-325 MG PO TABS: 1.0000 | ORAL_TABLET | Freq: Four times a day (QID) | ORAL | 0 refills | Status: DC | PRN

## 2016-12-16 NOTE — Telephone Encounter (Addendum)
Pt's states she was in [place] and wanted more pain medication for the pain. Dr. Amalia Hailey states Demerol refill as previously, and have pt make an appt prior to future refills. Pt's dtr came in to get pt's pain medication rx. I went to front reception to give pt's dtr the Demerol rx and she was outside. I gave the rx to R. Komonski - receptionist, to have pt make an appt prior to future refills. Pt came in stating she can not take demerol, it makes her nauseous and she told someone. Dr. Amalia Hailey states refill Percocet 10/325 as previously. I gave pt the percocet and made sure she did not drive and take the medication, pt states she has her dtr or husband drive since she began to have the problem with her leg.

## 2016-12-16 NOTE — Addendum Note (Signed)
Addended by: Harriett Sine D on: 12/16/2016 03:00 PM   Modules accepted: Orders

## 2016-12-30 ENCOUNTER — Other Ambulatory Visit: Payer: Self-pay

## 2016-12-30 ENCOUNTER — Ambulatory Visit (INDEPENDENT_AMBULATORY_CARE_PROVIDER_SITE_OTHER): Payer: Medicaid Other | Admitting: Podiatry

## 2016-12-30 ENCOUNTER — Ambulatory Visit (INDEPENDENT_AMBULATORY_CARE_PROVIDER_SITE_OTHER): Payer: Medicaid Other

## 2016-12-30 DIAGNOSIS — M2041 Other hammer toe(s) (acquired), right foot: Secondary | ICD-10-CM

## 2016-12-30 MED ORDER — OXYCODONE-ACETAMINOPHEN 10-325 MG PO TABS: 1.0000 | ORAL_TABLET | Freq: Four times a day (QID) | ORAL | 0 refills | Status: DC | PRN

## 2016-12-30 NOTE — Progress Notes (Signed)
   HPI: 56 year old female presents today for follow-up treatment and evaluation regarding right lower extremity peripheral neuropathy. Patient is currently being managed and treated at the [facility] for right lower extremity neuropathy of unknown etiology. Patient underwent bunion and hammer hammertoe corrective surgery to the right foot on 07/09/2016 at which time a preoperative popliteal block was provided for anesthesia. Postoperatively the patient developed significant pain, numbness, and paresthesia to the right lower extremity extending proximal to the level of the thigh. Nerve conduction studies were positive for irregularities to the right lower extremity nerve conduction velocities. She presents today for follow-up treatment and evaluation regarding her right foot bunionectomy and hammertoe repair.   Physical Exam: General: The patient is alert and oriented x3 in no acute distress.  Dermatology: Skin is warm, dry and supple bilateral lower extremities. Negative for open lesions or macerations.  Vascular: Palpable pedal pulses bilaterally. No edema or erythema noted. Capillary refill within normal limits.  Neurological: please see NCV with EMG  Musculoskeletal Exam: Range of motion within normal limits to all pedal and ankle joints bilateral. Muscle strength 5/5 in all groups bilateral with exception of the extensors of the right foot consistent with findings of the NCV exam.   Radiographic exam: Osteotomies in orthopedic hardware within the right foot appear stable with complete healing. No malalignment or irregularities noted on radiographic exam.  Assessment: 1. Peripheral neuropathy right lower extremity   2. Neuritis right lower extremity 3. Status post bunionectomy with hammertoe repair right foot. Date of surgery 07/09/2016   Plan of Care:  1. Patient was evaluated. X-rays were reviewed today. 2. From a surgical standpoint the patient has reached maximum  improvement and pathology and symptoms appear to be coming more proximal and of neurological etiology. 3. Continue follow-up with [facility] 4. Refilled pain medication today provided  5. Return to clinic when necessary  Edrick Kins, DPM [facility]  Dr. Edrick Kins, DPM    [address], [place] [address]                Office [phone]  Fax [phone]

## 2017-01-05 ENCOUNTER — Inpatient Hospital Stay: Admission: RE | Admit: 2017-01-05 | Payer: Medicaid Other | Source: Ambulatory Visit

## 2017-01-11 ENCOUNTER — Telehealth: Payer: Self-pay | Admitting: Podiatry

## 2017-01-11 NOTE — Telephone Encounter (Signed)
Hi Carla Bell. I was calling to see if you were able to get my medication refilled. I called Thursday and I haven't heard nothing back. Please call me back at [phone]. Thank you.

## 2017-01-12 MED ORDER — HYDROCODONE-ACETAMINOPHEN 5-325 MG PO TABS: 1.0000 | ORAL_TABLET | Freq: Four times a day (QID) | ORAL | 0 refills | Status: DC | PRN

## 2017-01-12 NOTE — Addendum Note (Signed)
Addended by: Harriett Sine D on: 01/12/2017 01:36 PM   Modules accepted: Orders

## 2017-01-12 NOTE — Telephone Encounter (Signed)
Hi Carla Bell, this is Carla Bell. I was calling to find out if Dr. Amalia Hailey was going to give me anymore refills. I've been calling since Thursday. Can you please return my call at [phone]. Thank you.

## 2017-01-12 NOTE — Telephone Encounter (Signed)
Please prescribe vicodin 5/325mg  #60 q6h or an equivalent. I have been prescribing Percocet in the past, however please explained the patient that we need to begin tapering her off of opioid pain medication thanks, Dr. Amalia Hailey.

## 2017-01-12 NOTE — Telephone Encounter (Addendum)
I informed pt of Dr. Amalia Hailey 01/12/2017 11:22am orders and recommendation. Pt states understanding and will pick up the rx in the [place] office.

## 2017-01-18 ENCOUNTER — Ambulatory Visit
Admission: RE | Admit: 2017-01-18 | Discharge: 2017-01-18 | Disposition: A | Discharge status: Home / Self Care | Payer: Medicaid Other | Source: Ambulatory Visit | Attending: Neurology | Admitting: Neurology

## 2017-01-18 DIAGNOSIS — G5701 Lesion of sciatic nerve, right lower limb: Secondary | ICD-10-CM

## 2017-01-18 DIAGNOSIS — G8929 Other chronic pain: Secondary | ICD-10-CM

## 2017-01-18 DIAGNOSIS — M25561 Pain in right knee: Secondary | ICD-10-CM

## 2017-01-18 DIAGNOSIS — M792 Neuralgia and neuritis, unspecified: Secondary | ICD-10-CM

## 2017-01-18 MED ORDER — GADOBENATE DIMEGLUMINE 529 MG/ML IV SOLN
17.0000 mL | Freq: Once | INTRAVENOUS | Status: AC | PRN
  Administered 2017-01-18: 17 mL via INTRAVENOUS

## 2017-01-19 ENCOUNTER — Telehealth: Payer: Self-pay | Admitting: *Deleted

## 2017-01-19 NOTE — Telephone Encounter (Signed)
-----   Message from Alda Berthold, DO sent at 01/19/2017  9:46 AM EST ----- Please inform patient that her MRI shows normal-appearing nerves, without evidence of any scarring or structural damage. Thanks.

## 2017-01-19 NOTE — Telephone Encounter (Signed)
Patient given results

## 2017-02-09 ENCOUNTER — Telehealth: Payer: Self-pay | Admitting: Podiatry

## 2017-02-09 ENCOUNTER — Telehealth: Payer: Self-pay | Admitting: Neurology

## 2017-02-09 NOTE — Telephone Encounter (Signed)
Pt called and said she is still having problems with her foot and feels like everyone has forgotten about her and she is still having the same issues and would like a call back as to what to do

## 2017-02-09 NOTE — Telephone Encounter (Signed)
[place], hi, this is Carla Bell. I am completley confused on what to do about my foot. Can you please call me back at [phone] please. I don't know what to do about it. Thanks.

## 2017-02-09 NOTE — Telephone Encounter (Signed)
There is really nothing further I can provide her. All of her symptoms are neurological. Prefer she follow up with Neuro. Thanks, Dr. Amalia Hailey

## 2017-02-09 NOTE — Telephone Encounter (Signed)
As we had discussed in the office, there is nothing that can be done to hasten nerve recovery and this will take time, but we can try to help with her pain relief.  If she is tolerating nortriptyline 20mg  at bedtime, recommend increasing this to 30mg  at bedtime (there is still room to go up on this, but will do it slowly).    Follow-up in the clinic in 3 months to reassess how she is doing.

## 2017-02-09 NOTE — Telephone Encounter (Signed)
Dr. Amalia Hailey said the surgery went fine, the neurologist sent for testing which was abnormal and MRI said the knee was fine. Pt states she doesn't know how the test could be abnormal and the MRI show no problem. I told pt that if she was continuing to have pain with the foot she would need to see Dr. Amalia Hailey, but if she had continued concerns with the MRI and nerve testing results she would need to follow up with the Neurologist. Pt states she is getting the feeling the neurologist is dismissing her and pt states she doesn't know what to do feels she is in limbo. I told pt she should make and appt with Dr. Amalia Hailey to be reevaluated.

## 2017-02-09 NOTE — Telephone Encounter (Signed)
See her note.

## 2017-02-10 NOTE — Telephone Encounter (Signed)
Attempted to contact patient.  No answer and no voicemail. 

## 2017-02-10 NOTE — Telephone Encounter (Signed)
Unable to inform pt Dr. Amalia Hailey recommended she continue care with Neurologist, phone rang for over 1 minute without answering service.

## 2017-02-11 NOTE — Telephone Encounter (Signed)
Pt called states she spoke to someone and they said Dr. Amalia Hailey had wanted her to follow up with the Neurologist, so does she need to keep 02/17/2017 appt. I told her, she was being checked for the surgery foot, and the nerve problem would need to be followed up by the neurologist.

## 2017-02-12 NOTE — Telephone Encounter (Signed)
Called patient back and gave her the instructions.  Informed her that I will mail her an appointment card for a follow up in 3 months.

## 2017-02-16 ENCOUNTER — Other Ambulatory Visit: Payer: Self-pay | Admitting: Internal Medicine

## 2017-02-16 DIAGNOSIS — J449 Chronic obstructive pulmonary disease, unspecified: Secondary | ICD-10-CM

## 2017-02-17 ENCOUNTER — Ambulatory Visit: Payer: Medicaid Other | Admitting: Podiatry

## 2017-02-17 ENCOUNTER — Encounter: Payer: Self-pay | Admitting: Podiatry

## 2017-02-17 DIAGNOSIS — M5431 Sciatica, right side: Secondary | ICD-10-CM

## 2017-02-17 DIAGNOSIS — G5791 Unspecified mononeuropathy of right lower limb: Secondary | ICD-10-CM | POA: Diagnosis not present

## 2017-02-17 MED ORDER — OXYCODONE-ACETAMINOPHEN 10-325 MG PO TABS: 1.0000 | ORAL_TABLET | Freq: Four times a day (QID) | ORAL | 0 refills | Status: DC | PRN

## 2017-02-21 NOTE — Progress Notes (Signed)
   HPI: 56 year old female presents today for follow-up treatment and evaluation regarding right lower extremity peripheral neuropathy. Patient is currently being managed and treated at the [facility] for right lower extremity neuropathy of unknown etiology. Patient underwent bunion and hammer hammertoe corrective surgery to the right foot on 07/09/2016.  She has not followed up with neurology yet.  She states her pain is unchanged.  Patient is here for further evaluation and treatment.   Past Medical History:  Diagnosis Date  . Asthma   . Cancer ([place])   . COPD (chronic obstructive pulmonary disease) ([place])   . Pericardial effusion 07/09/2015  . Pleuritic chest pain 07/09/2015     Physical Exam: General: The patient is alert and oriented x3 in no acute distress.  Dermatology: Skin is warm, dry and supple bilateral lower extremities. Negative for open lesions or macerations.  Vascular: Palpable pedal pulses bilaterally. No edema or erythema noted. Capillary refill within normal limits.  Neurological: please see NCV with EMG  Musculoskeletal Exam: Range of motion within normal limits to all pedal and ankle joints bilateral. Muscle strength 5/5 in all groups bilateral with exception of the extensors of the right foot consistent with findings of the NCV exam.    Assessment: 1. Peripheral neuropathy right lower extremity   2. Neuritis right lower extremity 3. Status post bunionectomy with hammertoe repair right foot. Date of surgery 07/09/2016   Plan of Care:  1. Patient was evaluated. 2.  Orders for physical therapy 3 times a week times 4 weeks placed. 3.  Refill prescription for Percocet 10/325 mg given to patient. 4.  From a surgical standpoint the patient has reached maximum improvement in pathology and symptoms appear to be coming more proximal and of neurological etiology. 5.  Continue follow-up with [facility]. 6.  Return to clinic as needed.  Edrick Kins, DPM [facility]  Dr. Edrick Kins, DPM    [address], [place] [address]                Office [phone]  Fax [phone]

## 2017-02-24 ENCOUNTER — Other Ambulatory Visit: Payer: Self-pay | Admitting: Family Medicine

## 2017-02-24 ENCOUNTER — Telehealth: Payer: Self-pay | Admitting: Family Medicine

## 2017-02-24 DIAGNOSIS — G4700 Insomnia, unspecified: Secondary | ICD-10-CM

## 2017-02-24 MED ORDER — TRAZODONE HCL 100 MG PO TABS: 200.0000 mg | ORAL_TABLET | Freq: Every day | ORAL | 1 refills | Status: DC

## 2017-02-24 NOTE — Telephone Encounter (Signed)
Will refill. If she requests additional refills she will need schedule office visit.

## 2017-02-24 NOTE — Telephone Encounter (Signed)
Will forward to PCP 

## 2017-02-24 NOTE — Telephone Encounter (Signed)
Patient called asking for medication refill on the trazodone. Please fu

## 2017-02-24 NOTE — Telephone Encounter (Signed)
CMA call regarding medication refill sent to pharmacy  Patient was aware and understood that she needs to make an appt  for additional refill

## 2017-03-31 DIAGNOSIS — M79676 Pain in unspecified toe(s): Secondary | ICD-10-CM

## 2017-04-08 DIAGNOSIS — J441 Chronic obstructive pulmonary disease with (acute) exacerbation: Secondary | ICD-10-CM

## 2017-04-20 ENCOUNTER — Ambulatory Visit: Payer: BLUE CROSS/BLUE SHIELD | Attending: Family Medicine | Admitting: Family Medicine

## 2017-04-20 ENCOUNTER — Encounter: Payer: Self-pay | Admitting: Family Medicine

## 2017-04-20 VITALS — BP 128/84 | HR 76 | Temp 98.1°F | Resp 18 | Wt 194.0 lb

## 2017-04-20 DIAGNOSIS — F419 Anxiety disorder, unspecified: Secondary | ICD-10-CM

## 2017-04-20 DIAGNOSIS — F909 Attention-deficit hyperactivity disorder, unspecified type: Secondary | ICD-10-CM | POA: Diagnosis not present

## 2017-04-20 DIAGNOSIS — F99 Mental disorder, not otherwise specified: Secondary | ICD-10-CM | POA: Diagnosis not present

## 2017-04-20 DIAGNOSIS — F5105 Insomnia due to other mental disorder: Secondary | ICD-10-CM

## 2017-04-20 MED ORDER — HYDROXYZINE HCL 10 MG PO TABS: 10.0000 mg | ORAL_TABLET | Freq: Two times a day (BID) | ORAL | 2 refills | Status: DC | PRN

## 2017-04-20 MED ORDER — ESCITALOPRAM OXALATE 20 MG PO TABS: 20.0000 mg | ORAL_TABLET | Freq: Every day | ORAL | 2 refills | Status: DC

## 2017-04-20 MED ORDER — TRAZODONE HCL 100 MG PO TABS: 250.0000 mg | ORAL_TABLET | Freq: Every evening | ORAL | 1 refills | Status: DC | PRN

## 2017-04-20 NOTE — Progress Notes (Signed)
Subjective:  Patient ID: Carla Bell, female    DOB: [birth year]  Age: 57 y.o. MRN: [id number]  CC: Medication Refill   HPI Sundra Haddix presents for patient complains of anxious mood.  She has the following symptoms: insomnia, hyperactivity, and racing . Onset of symptoms was approximately a few months ago, unchanged since that time. She denies current suicidal and homicidal ideation. She denies depressed mood.  Possible organic causes contributing are: none. Previous treatment includes trazadone . She declines speaking with LCSW or referral. She is agreeable to medication. She requests medication for sleep. History of COPD. She reports being followed by pulmonology. She denies any increased inhaler use.     Outpatient Medications Prior to Visit  Medication Sig Dispense Refill  . albuterol (PROVENTIL) (2.5 MG/3ML) 0.083% nebulizer solution Take 3 mLs (2.5 mg total) by nebulization every 4 (four) hours as needed for wheezing or shortness of breath. 75 mL 11  . ibuprofen (ADVIL,MOTRIN) 600 MG tablet Take 1 tablet (600 mg total) by mouth every 8 (eight) hours as needed. 30 tablet 1  . SPIRIVA RESPIMAT 2.5 MCG/ACT AERS INHALE TWO PUFFS INTO LUNGS EVERY MORNING 4 g 1  . budesonide-formoterol (SYMBICORT) 160-4.5 MCG/ACT inhaler Take 2 puffs first thing in am and then another 2 puffs about 12 hours later. (Patient not taking: Reported on 04/20/2017) 1 Inhaler 11  . diclofenac (VOLTAREN) 75 MG EC tablet Take 1 tablet (75 mg total) by mouth 2 (two) times daily. (Patient not taking: Reported on 04/20/2017) 60 tablet 0  . HYDROcodone-acetaminophen (NORCO/VICODIN) 5-325 MG tablet Take 1 tablet by mouth every 6 (six) hours as needed for moderate pain. (Patient not taking: Reported on 04/20/2017) 60 tablet 0  . meloxicam (MOBIC) 15 MG tablet Take 15 mg by mouth daily.    . meperidine (DEMEROL) 50 MG tablet Take 1 tablet (50 mg total) by mouth every 6 (six) hours as needed for severe pain. (Patient  not taking: Reported on 04/20/2017) 30 tablet 0  . meperidine (DEMEROL) 50 MG tablet Take 1 tablet (50 mg total) by mouth every 6 (six) hours as needed for severe pain. (Patient not taking: Reported on 04/20/2017) 30 tablet 0  . mupirocin ointment (BACTROBAN) 2 % Apply 1 application topically 2 (two) times daily. 30 g 2  . nortriptyline (PAMELOR) 10 MG capsule nortriptyline 10mg  at bedtime for 2 week, then increase to 2 tablet at bedtime (Patient not taking: Reported on 04/20/2017) 60 capsule 3  . oxyCODONE-acetaminophen (PERCOCET) 10-325 MG tablet Take 1 tablet by mouth every 6 (six) hours as needed for pain. (Patient not taking: Reported on 04/20/2017) 30 tablet 0  . oxyCODONE-acetaminophen (ROXICET) 5-325 MG tablet Take 1 tablet by mouth every 6 (six) hours as needed for severe pain. (Patient not taking: Reported on 04/20/2017) 30 tablet 0  . traZODone (DESYREL) 100 MG tablet Take 2 tablets (200 mg total) by mouth at bedtime. (Patient not taking: Reported on 04/20/2017) 60 tablet 1   Facility-Administered Medications Prior to Visit  Medication Dose Route Frequency Provider Last Rate Last Dose  . betamethasone acetate-betamethasone sodium phosphate (CELESTONE) injection 3 mg  3 mg Intramuscular Once Daylene Katayama M, DPM      . betamethasone acetate-betamethasone sodium phosphate (CELESTONE) injection 3 mg  3 mg Intramuscular Once Edrick Kins, DPM        ROS Review of Systems  Constitutional: Negative.   Respiratory: Negative.   Cardiovascular: Negative.   Neurological: Negative for tremors.  Psychiatric/Behavioral: Positive for sleep disturbance.  Negative for dysphoric mood. The patient is nervous/anxious.         Objective:  BP 128/84 (BP Location: Right Arm, Patient Position: Sitting, Cuff Size: Large)   Pulse 76   Temp 98.1 F (36.7 C) (Oral)   Resp 18   Wt 194 lb (88 kg)   SpO2 96%   BMI 28.65 kg/m   BP/Weight 04/20/2017 1/96/2229 09/22/8919  Systolic BP 194 174 081  Diastolic BP 84  70 83  Wt. (Lbs) 194 184.5 200  BMI 28.65 27.25 29.53     Physical Exam  Constitutional: She appears well-developed and well-nourished.  Cardiovascular: Normal rate, regular rhythm, normal heart sounds and intact distal pulses.  Pulmonary/Chest: Effort normal and breath sounds normal.  Psychiatric: Her mood appears anxious. Her speech is tangential. She expresses no suicidal ideation. She expresses no suicidal plans and no homicidal plans. She is communicative. She is attentive.  Nursing note and vitals reviewed.  Depression screen [facility] 2/9 04/20/2017 10/22/2016 04/14/2016  Decreased Interest 0 0 0  Down, Depressed, Hopeless 0 0 0  PHQ - 2 Score 0 0 0  Altered sleeping 3 3 -  Tired, decreased energy 0 1 -  Change in appetite 0 1 -  Feeling bad or failure about yourself  0 0 -  Trouble concentrating 3 0 -  Moving slowly or fidgety/restless 0 0 -  Suicidal thoughts 0 0 -  PHQ-9 Score 6 5 -  Difficult doing work/chores - - -   GAD 7 : Generalized Anxiety Score 04/20/2017 10/22/2016 04/14/2016 07/03/2015  Nervous, Anxious, on Edge 3 0 2 0  Control/stop worrying 2 0 0 0  Worry too much - different things 3 1 0 0  Trouble relaxing 3 1 2  0  Restless 3 1 2  0  Easily annoyed or irritable 2 1 0 0  Afraid - awful might happen 0 0 0 0  Total GAD 7 Score 16 4 6  0  Anxiety Difficulty - - - Not difficult at all      Assessment & Plan:   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  Dispense: 90 tablet; Refill: 1  2. Anxiety Given handout on counseling resources and LCSW card. - hydrOXYzine (ATARAX/VISTARIL) 10 MG tablet; Take 1 tablet (10 mg total) by mouth 2 (two) times daily as needed for anxiety.  Dispense: 30 tablet; Refill: 2 - traZODone (DESYREL) 100 MG tablet; Take 2.5 tablets (250 mg total) by mouth at bedtime as needed for sleep.  Dispense: 90 tablet; Refill: 1 - escitalopram  (LEXAPRO) 20 MG tablet; Take 1 tablet (20 mg total) by mouth daily.  Dispense: 30 tablet; Refill: 2  3. Hyperactivity  - hydrOXYzine (ATARAX/VISTARIL) 10 MG tablet; Take 1 tablet (10 mg total) by mouth 2 (two) times daily as needed for anxiety.  Dispense: 30 tablet; Refill: 2      Follow-up: Return in about 8 weeks (around 06/15/2017) for Anxiety and Insomnia.   Alfonse Spruce FNP

## 2017-04-20 NOTE — Progress Notes (Signed)
Medication refills Rx trazodone No medication x 1 month  Has question about changing medication   No tobacco user  Marijuana user. Last used today  No pain today

## 2017-04-20 NOTE — Patient Instructions (Signed)
Insomnia Insomnia is a sleep disorder that makes it difficult to fall asleep or to stay asleep. Insomnia can cause tiredness (fatigue), low energy, difficulty concentrating, mood swings, and poor performance at work or school. There are three different ways to classify insomnia:  Difficulty falling asleep.  Difficulty staying asleep.  Waking up too early in the morning.  Any type of insomnia can be long-term (chronic) or short-term (acute). Both are common. Short-term insomnia usually lasts for three months or less. Chronic insomnia occurs at least three times a week for longer than three months. What are the causes? Insomnia may be caused by another condition, situation, or substance, such as:  Anxiety.  Certain medicines.  Gastroesophageal reflux disease (GERD) or other gastrointestinal conditions.  Asthma or other breathing conditions.  Restless legs syndrome, sleep apnea, or other sleep disorders.  Chronic pain.  Menopause. This may include hot flashes.  Stroke.  Abuse of alcohol, tobacco, or illegal drugs.  Depression.  Caffeine.  Neurological disorders, such as Alzheimer disease.  An overactive thyroid (hyperthyroidism).  The cause of insomnia may not be known. What increases the risk? Risk factors for insomnia include:  Gender. Women are more commonly affected than men.  Age. Insomnia is more common as you get older.  Stress. This may involve your professional or personal life.  Income. Insomnia is more common in people with lower income.  Lack of exercise.  Irregular work schedule or night shifts.  Traveling between different time zones.  What are the signs or symptoms? If you have insomnia, trouble falling asleep or trouble staying asleep is the main symptom. This may lead to other symptoms, such as:  Feeling fatigued.  Feeling nervous about going to sleep.  Not feeling rested in the morning.  Having trouble concentrating.  Feeling  irritable, anxious, or depressed.  How is this treated? Treatment for insomnia depends on the cause. If your insomnia is caused by an underlying condition, treatment will focus on addressing the condition. Treatment may also include:  Medicines to help you sleep.  Counseling or therapy.  Lifestyle adjustments.  Follow these instructions at home:  Take medicines only as directed by your health care provider.  Keep regular sleeping and waking hours. Avoid naps.  Keep a sleep diary to help you and your health care provider figure out what could be causing your insomnia. Include: ? When you sleep. ? When you wake up during the night. ? How well you sleep. ? How rested you feel the next day. ? Any side effects of medicines you are taking. ? What you eat and drink.  Make your bedroom a comfortable place where it is easy to fall asleep: ? Put up shades or special blackout curtains to block light from outside. ? Use a white noise machine to block noise. ? Keep the temperature cool.  Exercise regularly as directed by your health care provider. Avoid exercising right before bedtime.  Use relaxation techniques to manage stress. Ask your health care provider to suggest some techniques that may work well for you. These may include: ? Breathing exercises. ? Routines to release muscle tension. ? Visualizing peaceful scenes.  Cut back on alcohol, caffeinated beverages, and cigarettes, especially close to bedtime. These can disrupt your sleep.  Do not overeat or eat spicy foods right before bedtime. This can lead to digestive discomfort that can make it hard for you to sleep.  Limit screen use before bedtime. This includes: ? Watching TV. ? Using your smartphone, tablet, and   tension.  ? Visualizing peaceful scenes.   Cut back on alcohol, caffeinated beverages, and cigarettes, especially close to bedtime. These can disrupt your sleep.   Do not overeat or eat spicy foods right before bedtime. This can lead to digestive discomfort that can make it hard for you to sleep.   Limit screen use before bedtime. This includes:  ? Watching TV.  ? Using your smartphone, tablet, and computer.   Stick to a routine. This can help you fall asleep faster. Try to do a quiet activity, brush your teeth, and go to bed at the same time each night.   Get out of bed if you are still awake after 15 minutes  of trying to sleep. Keep the lights down, but try reading or doing a quiet activity. When you feel sleepy, go back to bed.   Make sure that you drive carefully. Avoid driving if you feel very sleepy.   Keep all follow-up appointments as directed by your health care provider. This is important.  Contact a health care provider if:   You are tired throughout the day or have trouble in your daily routine due to sleepiness.   You continue to have sleep problems or your sleep problems get worse.  Get help right away if:   You have serious thoughts about hurting yourself or someone else.  This information is not intended to replace advice given to you by your health care provider. Make sure you discuss any questions you have with your health care provider.  Document Released: 02/28/2000 Document Revised: 08/02/2015 Document Reviewed: 12/01/2013  Elsevier Interactive Patient Education  2018 [employer]      Living With Anxiety  After being diagnosed with an anxiety disorder, you may be relieved to know why you have felt or behaved a certain way. It is natural to also feel overwhelmed about the treatment ahead and what it will mean for your life. With care and support, you can manage this condition and recover from it.  How to cope with anxiety  Dealing with stress  Stress is your body's reaction to life changes and events, both good and bad. Stress can last just a few hours or it can be ongoing. Stress can play a major role in anxiety, so it is important to learn both how to cope with stress and how to think about it differently.  Talk with your health care provider or a counselor to learn more about stress reduction. He or she may suggest some stress reduction techniques, such as:   Music therapy. This can include creating or listening to music that you enjoy and that inspires you.   Mindfulness-based meditation. This involves being aware of your normal breaths, rather than trying to control your breathing. It  can be done while sitting or walking.   Centering prayer. This is a kind of meditation that involves focusing on a word, phrase, or sacred image that is meaningful to you and that brings you peace.   Deep breathing. To do this, expand your stomach and inhale slowly through your nose. Hold your breath for 3-5 seconds. Then exhale slowly, allowing your stomach muscles to relax.   Self-talk. This is a skill where you identify thought patterns that lead to anxiety reactions and correct those thoughts.   Muscle relaxation. This involves tensing muscles then relaxing them.    Choose a stress reduction technique that fits your lifestyle and personality. Stress reduction techniques take time and practice. Set aside 5-15 minutes   a day to do them. Therapists can offer training in these techniques. The training may be covered by some insurance plans. Other things you can do to manage stress include:   Keeping a stress diary. This can help you learn what triggers your stress and ways to control your response.   Thinking about how you respond to certain situations. You may not be able to control everything, but you can control your reaction.   Making time for activities that help you relax, and not feeling guilty about spending your time in this way.    Therapy combined with coping and stress-reduction skills provides the best chance for successful treatment.  Medicines  Medicines can help ease symptoms. Medicines for anxiety include:   Anti-anxiety drugs.   Antidepressants.   Beta-blockers.    Medicines may be used as the main treatment for anxiety disorder, along with therapy, or if other treatments are not working. Medicines should be prescribed by a health care provider.  Relationships  Relationships can play a big part in helping you recover. Try to spend more time connecting with trusted friends and family members. Consider going to couples counseling, taking family education classes, or going to family therapy.  Therapy can help you and others better understand the condition.  How to recognize changes in your condition  Everyone has a different response to treatment for anxiety. Recovery from anxiety happens when symptoms decrease and stop interfering with your daily activities at home or work. This may mean that you will start to:   Have better concentration and focus.   Sleep better.   Be less irritable.   Have more energy.   Have improved memory.    It is important to recognize when your condition is getting worse. Contact your health care provider if your symptoms interfere with home or work and you do not feel like your condition is improving.  Where to find help and support:  You can get help and support from these sources:   Self-help groups.   Online and community organizations.   A trusted spiritual leader.   Couples counseling.   Family education classes.   Family therapy.    Follow these instructions at home:   Eat a healthy diet that includes plenty of vegetables, fruits, whole grains, low-fat dairy products, and lean protein. Do not eat a lot of foods that are high in solid fats, added sugars, or salt.   Exercise. Most adults should do the following:  ? Exercise for at least 150 minutes each week. The exercise should increase your heart rate and make you sweat (moderate-intensity exercise).  ? Strengthening exercises at least twice a week.   Cut down on caffeine, tobacco, alcohol, and other potentially harmful substances.   Get the right amount and quality of sleep. Most adults need 7-9 hours of sleep each night.   Make choices that simplify your life.   Take over-the-counter and prescription medicines only as told by your health care provider.   Avoid caffeine, alcohol, and certain over-the-counter cold medicines. These may make you feel worse. Ask your pharmacist which medicines to avoid.   Keep all follow-up visits as told by your health care provider. This is important.  Questions to ask  your health care provider   Would I benefit from therapy?   How often should I follow up with a health care provider?   How long do I need to take medicine?   Are there any long-term side effects of my   medicine?   Are there any alternatives to taking medicine?  Contact a health care provider if:   You have a hard time staying focused or finishing daily tasks.   You spend many hours a day feeling worried about everyday life.   You become exhausted by worry.   You start to have headaches, feel tense, or have nausea.   You urinate more than normal.   You have diarrhea.  Get help right away if:   You have a racing heart and shortness of breath.   You have thoughts of hurting yourself or others.  If you ever feel like you may hurt yourself or others, or have thoughts about taking your own life, get help right away. You can go to your nearest emergency department or call:   Your local emergency services (911 in the [place]   A suicide crisis helpline, such as the National Suicide Prevention Lifeline at 1-800-273-8255. This is open 24-hours a day.    Summary   Taking steps to deal with stress can help calm you.   Medicines cannot cure anxiety disorders, but they can help ease symptoms.   Family, friends, and partners can play a big part in helping you recover from an anxiety disorder.  This information is not intended to replace advice given to you by your health care provider. Make sure you discuss any questions you have with your health care provider.  Document Released: 02/25/2016 Document Revised: 02/25/2016 Document Reviewed: 02/25/2016  Elsevier Interactive Patient Education  2018 [employer] Inc[employer]

## 2017-05-02 ENCOUNTER — Other Ambulatory Visit: Payer: Self-pay | Admitting: Internal Medicine

## 2017-05-02 DIAGNOSIS — J449 Chronic obstructive pulmonary disease, unspecified: Secondary | ICD-10-CM

## 2017-06-18 ENCOUNTER — Ambulatory Visit: Payer: Medicaid Other | Admitting: Neurology

## 2017-07-07 ENCOUNTER — Telehealth: Payer: Self-pay | Admitting: Internal Medicine

## 2017-07-07 ENCOUNTER — Other Ambulatory Visit: Payer: Self-pay | Admitting: Internal Medicine

## 2017-07-07 DIAGNOSIS — J449 Chronic obstructive pulmonary disease, unspecified: Secondary | ICD-10-CM

## 2017-07-07 MED ORDER — TIOTROPIUM BROMIDE MONOHYDRATE 2.5 MCG/ACT IN AERS: INHALATION_SPRAY | RESPIRATORY_TRACT | 0 refills | Status: DC

## 2017-07-07 MED ORDER — BUDESONIDE-FORMOTEROL FUMARATE 160-4.5 MCG/ACT IN AERO: INHALATION_SPRAY | RESPIRATORY_TRACT | 0 refills | Status: DC

## 2017-07-07 NOTE — Telephone Encounter (Signed)
Pt requesting samples of symbicort and spiriva.  Pt has not been seen in over 1 year.  Scheduled rov for pt and sent in 1 inhaler of each to last until upcoming visit. Advised pt she must keep this appt for any future refills.  Pt expressed understanding. Nothing further needed.

## 2017-07-26 ENCOUNTER — Ambulatory Visit: Payer: BLUE CROSS/BLUE SHIELD | Admitting: Internal Medicine

## 2018-03-21 ENCOUNTER — Ambulatory Visit: Payer: Self-pay | Attending: Family Medicine | Admitting: Family Medicine

## 2018-03-21 ENCOUNTER — Encounter: Payer: Self-pay | Admitting: Family Medicine

## 2018-03-21 VITALS — BP 139/82 | HR 83 | Temp 97.6°F | Ht 69.0 in | Wt 185.2 lb

## 2018-03-21 DIAGNOSIS — J449 Chronic obstructive pulmonary disease, unspecified: Secondary | ICD-10-CM | POA: Insufficient documentation

## 2018-03-21 DIAGNOSIS — Z7951 Long term (current) use of inhaled steroids: Secondary | ICD-10-CM | POA: Insufficient documentation

## 2018-03-21 DIAGNOSIS — R42 Dizziness and giddiness: Secondary | ICD-10-CM | POA: Insufficient documentation

## 2018-03-21 DIAGNOSIS — Z148 Genetic carrier of other disease: Secondary | ICD-10-CM

## 2018-03-21 DIAGNOSIS — R634 Abnormal weight loss: Secondary | ICD-10-CM | POA: Insufficient documentation

## 2018-03-21 DIAGNOSIS — Z87891 Personal history of nicotine dependence: Secondary | ICD-10-CM | POA: Insufficient documentation

## 2018-03-21 DIAGNOSIS — Z791 Long term (current) use of non-steroidal anti-inflammatories (NSAID): Secondary | ICD-10-CM | POA: Insufficient documentation

## 2018-03-21 DIAGNOSIS — Z885 Allergy status to narcotic agent status: Secondary | ICD-10-CM | POA: Insufficient documentation

## 2018-03-21 DIAGNOSIS — E8801 Alpha-1-antitrypsin deficiency: Secondary | ICD-10-CM | POA: Insufficient documentation

## 2018-03-21 MED ORDER — ALBUTEROL SULFATE (2.5 MG/3ML) 0.083% IN NEBU: 2.5000 mg | INHALATION_SOLUTION | RESPIRATORY_TRACT | 6 refills | Status: DC | PRN

## 2018-03-21 MED ORDER — TIOTROPIUM BROMIDE MONOHYDRATE 2.5 MCG/ACT IN AERS: 1.0000 | INHALATION_SPRAY | Freq: Every day | RESPIRATORY_TRACT | 3 refills | Status: DC

## 2018-03-21 MED ORDER — UMECLIDINIUM BROMIDE 62.5 MCG/INH IN AEPB: 1.0000 | INHALATION_SPRAY | Freq: Every day | RESPIRATORY_TRACT | 3 refills | Status: DC

## 2018-03-21 MED ORDER — CETIRIZINE HCL 10 MG PO TABS: 10.0000 mg | ORAL_TABLET | Freq: Every day | ORAL | 1 refills | Status: DC

## 2018-03-21 MED ORDER — BUDESONIDE-FORMOTEROL FUMARATE 160-4.5 MCG/ACT IN AERO: INHALATION_SPRAY | RESPIRATORY_TRACT | 3 refills | Status: DC

## 2018-03-21 MED ORDER — ALBUTEROL SULFATE HFA 108 (90 BASE) MCG/ACT IN AERS: 2.0000 | INHALATION_SPRAY | Freq: Four times a day (QID) | RESPIRATORY_TRACT | 3 refills | Status: DC | PRN

## 2018-03-21 NOTE — Progress Notes (Signed)
Subjective:  Patient ID: Carla Bell, female    DOB: 03-29-1960  Age: 58 y.o. MRN: [id number]  CC: COPD   HPI Lexia Vandevender is a 58 year old female with a history of alpha-1 antitrypsin deficiency, COPD last seen in the clinic 1 year ago who presents to establish care with me. She was previously followed by pulmonary until she lost her medical insurance and has been out of all her inhalers but has been receiving some inhalers from her friend. She complains of feeling off balance intermittently due to dizziness but denies falls.  She also endorses feeling like she has an "ocean in her right ear" but denies sinus congestion or sinus pressure, headache, nausea or vomiting or visual concerns. Over the last 2 months she has lost 30 pounds unintentionally and would like this checked out. With regards to healthcare maintenance she needs a mammogram but has no medical coverage and is in the process of applying for the [place] financial discount which she will need to obtain the mammogram.  Past Medical History:  Diagnosis Date  . Asthma   . Cancer ([place])   . COPD (chronic obstructive pulmonary disease) ([place])   . Pericardial effusion 07/09/2015  . Pleuritic chest pain 07/09/2015    No past surgical history on file.  Allergies  Allergen Reactions  . Demerol [Meperidine] Nausea Only     Outpatient Medications Prior to Visit  Medication Sig Dispense Refill  . traZODone (DESYREL) 100 MG tablet Take 2.5 tablets (250 mg total) by mouth at bedtime as needed for sleep. 90 tablet 1  . Tiotropium Bromide Monohydrate (SPIRIVA RESPIMAT) 2.5 MCG/ACT AERS Inhale 2 puffs into lungs daily. Must keep office visit for refills. 4 g 0  . diclofenac (VOLTAREN) 75 MG EC tablet Take 1 tablet (75 mg total) by mouth 2 (two) times daily. (Patient not taking: Reported on 04/20/2017) 60 tablet 0  . escitalopram (LEXAPRO) 20 MG tablet Take 1 tablet (20 mg total) by mouth daily. (Patient not taking:  Reported on 03/21/2018) 30 tablet 2  . HYDROcodone-acetaminophen (NORCO/VICODIN) 5-325 MG tablet Take 1 tablet by mouth every 6 (six) hours as needed for moderate pain. (Patient not taking: Reported on 04/20/2017) 60 tablet 0  . hydrOXYzine (ATARAX/VISTARIL) 10 MG tablet Take 1 tablet (10 mg total) by mouth 2 (two) times daily as needed for anxiety. (Patient not taking: Reported on 03/21/2018) 30 tablet 2  . ibuprofen (ADVIL,MOTRIN) 600 MG tablet Take 1 tablet (600 mg total) by mouth every 8 (eight) hours as needed. (Patient not taking: Reported on 03/21/2018) 30 tablet 1  . meloxicam (MOBIC) 15 MG tablet Take 15 mg by mouth daily.    . meperidine (DEMEROL) 50 MG tablet Take 1 tablet (50 mg total) by mouth every 6 (six) hours as needed for severe pain. (Patient not taking: Reported on 04/20/2017) 30 tablet 0  . meperidine (DEMEROL) 50 MG tablet Take 1 tablet (50 mg total) by mouth every 6 (six) hours as needed for severe pain. (Patient not taking: Reported on 04/20/2017) 30 tablet 0  . mupirocin ointment (BACTROBAN) 2 % Apply 1 application topically 2 (two) times daily. (Patient not taking: Reported on 03/21/2018) 30 g 2  . nortriptyline (PAMELOR) 10 MG capsule nortriptyline '10mg'$  at bedtime for 2 week, then increase to 2 tablet at bedtime (Patient not taking: Reported on 04/20/2017) 60 capsule 3  . oxyCODONE-acetaminophen (PERCOCET) 10-325 MG tablet Take 1 tablet by mouth every 6 (six) hours as needed for pain. (Patient not taking:  Reported on 04/20/2017) 30 tablet 0  . albuterol (PROVENTIL) (2.5 MG/3ML) 0.083% nebulizer solution Take 3 mLs (2.5 mg total) by nebulization every 4 (four) hours as needed for wheezing or shortness of breath. (Patient not taking: Reported on 03/21/2018) 75 mL 11  . budesonide-formoterol (SYMBICORT) 160-4.5 MCG/ACT inhaler Inhale 2 puffs twice daily.  Must keep office visit for refills. (Patient not taking: Reported on 03/21/2018) 1 Inhaler 0  . oxyCODONE-acetaminophen (ROXICET) 5-325 MG tablet  Take 1 tablet by mouth every 6 (six) hours as needed for severe pain. (Patient not taking: Reported on 04/20/2017) 30 tablet 0   Facility-Administered Medications Prior to Visit  Medication Dose Route Frequency Provider Last Rate Last Dose  . betamethasone acetate-betamethasone sodium phosphate (CELESTONE) injection 3 mg  3 mg Intramuscular Once Daylene Katayama M, DPM      . betamethasone acetate-betamethasone sodium phosphate (CELESTONE) injection 3 mg  3 mg Intramuscular Once Daylene Katayama M, DPM        ROS Review of Systems  Constitutional: Negative for activity change, appetite change and fatigue.  HENT: Negative for congestion, sinus pressure and sore throat.   Eyes: Negative for visual disturbance.  Respiratory: Negative for cough, chest tightness, shortness of breath and wheezing.   Cardiovascular: Negative for chest pain and palpitations.  Gastrointestinal: Negative for abdominal distention, abdominal pain and constipation.  Endocrine: Negative for polydipsia.  Genitourinary: Negative for dysuria and frequency.  Musculoskeletal: Negative for arthralgias and back pain.  Skin: Negative for rash.  Neurological: Positive for dizziness. Negative for tremors, light-headedness and numbness.  Hematological: Does not bruise/bleed easily.  Psychiatric/Behavioral: Negative for agitation and behavioral problems.    Objective:  BP 139/82   Pulse 83   Temp 97.6 F (36.4 C) (Oral)   Ht '5\' 9"'$  (1.753 m)   Wt 185 lb 3.2 oz (84 kg)   SpO2 97%   BMI 27.35 kg/m   BP/Weight 03/21/2018 04/20/2017 3/54/6568  Systolic BP 127 517 001  Diastolic BP 82 84 70  Wt. (Lbs) 185.2 194 184.5  BMI 27.35 28.65 27.25      Physical Exam Constitutional:      Appearance: She is well-developed.  HENT:     Right Ear: Tympanic membrane normal.     Left Ear: Tympanic membrane normal.  Cardiovascular:     Rate and Rhythm: Normal rate.     Heart sounds: Normal heart sounds. No murmur.  Pulmonary:     Effort:  Pulmonary effort is normal.     Breath sounds: Normal breath sounds. No wheezing or rales.  Chest:     Chest wall: No tenderness.  Abdominal:     General: Bowel sounds are normal. There is no distension.     Palpations: Abdomen is soft. There is no mass.     Tenderness: There is no abdominal tenderness.  Musculoskeletal: Normal range of motion.  Neurological:     Mental Status: She is alert and oriented to person, place, and time.     Motor: No weakness.     Coordination: Coordination normal.     Gait: Gait normal.     Deep Tendon Reflexes: Reflexes normal.  Psychiatric:        Mood and Affect: Mood normal.        Behavior: Behavior normal.      Assessment & Plan:   1. COPD GOLD II/ MZ/ quit smoking 1981  No acute exacerbation Unable to afford medications We will send refills to pharmacy in-house which would be cost  effective for him - Tiotropium Bromide Monohydrate (SPIRIVA RESPIMAT) 2.5 MCG/ACT AERS; Inhale 1 capsule into the lungs daily. .  Dispense: 4 g; Refill: 3 - albuterol (PROVENTIL) (2.5 MG/3ML) 0.083% nebulizer solution; Take 3 mLs (2.5 mg total) by nebulization every 4 (four) hours as needed for wheezing or shortness of breath.  Dispense: 75 mL; Refill: 6 - budesonide-formoterol (SYMBICORT) 160-4.5 MCG/ACT inhaler; Inhale 2 puffs twice daily.  Dispense: 1 Inhaler; Refill: 3 - albuterol (PROVENTIL HFA;VENTOLIN HFA) 108 (90 Base) MCG/ACT inhaler; Inhale 2 puffs into the lungs every 6 (six) hours as needed for wheezing or shortness of breath.  Dispense: 1 Inhaler; Refill: 3  2. Weight loss Could be possibly from dietary modifications and lifestyle changes - T4, free - TSH - CMP14+EGFR  3. Dizziness No evidence of cerebellar dysfunction on exam - cetirizine (ZYRTEC) 10 MG tablet; Take 1 tablet (10 mg total) by mouth daily.  Dispense: 30 tablet; Refill: 1  4. Alpha-1-antitrypsin deficiency carrier Previously followed by pulmonary but lost medical coverage   Meds  ordered this encounter  Medications  . Tiotropium Bromide Monohydrate (SPIRIVA RESPIMAT) 2.5 MCG/ACT AERS    Sig: Inhale 1 capsule into the lungs daily. .    Dispense:  4 g    Refill:  3  . albuterol (PROVENTIL) (2.5 MG/3ML) 0.083% nebulizer solution    Sig: Take 3 mLs (2.5 mg total) by nebulization every 4 (four) hours as needed for wheezing or shortness of breath.    Dispense:  75 mL    Refill:  6  . budesonide-formoterol (SYMBICORT) 160-4.5 MCG/ACT inhaler    Sig: Inhale 2 puffs twice daily.    Dispense:  1 Inhaler    Refill:  3  . albuterol (PROVENTIL HFA;VENTOLIN HFA) 108 (90 Base) MCG/ACT inhaler    Sig: Inhale 2 puffs into the lungs every 6 (six) hours as needed for wheezing or shortness of breath.    Dispense:  1 Inhaler    Refill:  3  . cetirizine (ZYRTEC) 10 MG tablet    Sig: Take 1 tablet (10 mg total) by mouth daily.    Dispense:  30 tablet    Refill:  1    Follow-up: Return in about 3 months (around 06/20/2018) for Follow-up of chronic medical conditions.   Charlott Rakes MD

## 2018-03-21 NOTE — Progress Notes (Signed)
Patient has been having dizzy spells.

## 2018-03-22 LAB — CMP14+EGFR
ALT: 29 IU/L (ref 0–32)
AST: 26 IU/L (ref 0–40)
Albumin/Globulin Ratio: 2 (ref 1.2–2.2)
Albumin: 4.5 g/dL (ref 3.5–5.5)
Alkaline Phosphatase: 97 IU/L (ref 39–117)
BUN/Creatinine Ratio: 18 (ref 9–23)
BUN: 12 mg/dL (ref 6–24)
Bilirubin Total: 0.3 mg/dL (ref 0.0–1.2)
CO2: 25 mmol/L (ref 20–29)
Calcium: 9.3 mg/dL (ref 8.7–10.2)
Chloride: 100 mmol/L (ref 96–106)
Creatinine, Ser: 0.66 mg/dL (ref 0.57–1.00)
GFR calc Af Amer: 112 mL/min/{1.73_m2} (ref >59)
GFR, EST NON AFRICAN AMERICAN: 97 mL/min/{1.73_m2} (ref >59)
Globulin, Total: 2.2 g/dL (ref 1.5–4.5)
Glucose: 105 mg/dL — ABNORMAL HIGH (ref 65–99)
Potassium: 4.1 mmol/L (ref 3.5–5.2)
Sodium: 143 mmol/L (ref 134–144)
TOTAL PROTEIN: 6.7 g/dL (ref 6.0–8.5)

## 2018-03-22 LAB — T4, FREE: Free T4: 1.06 ng/dL (ref 0.82–1.77)

## 2018-03-22 LAB — TSH: TSH: 1.9 u[IU]/mL (ref 0.450–4.500)

## 2018-03-24 ENCOUNTER — Telehealth: Payer: Self-pay

## 2018-03-24 NOTE — Telephone Encounter (Signed)
-----   Message from Charlott Rakes, MD sent at 03/23/2018 10:21 AM EST ----- Please inform the patient that labs are normal. Thank you.

## 2018-03-24 NOTE — Telephone Encounter (Signed)
Patient was called and informed of lab results. 

## 2018-03-30 ENCOUNTER — Ambulatory Visit: Payer: Self-pay

## 2018-06-03 ENCOUNTER — Telehealth: Payer: Self-pay | Admitting: Family Medicine

## 2018-06-03 NOTE — Telephone Encounter (Signed)
Patient called in for Covid-19 advise.  Patient was identified by name and date of birth.  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  Patient denied fever, shortness of breath worse than baseline, no present cough.  Patient was informed of COVID-19 symptoms.  Patient informed she did not meet standards for COVID-19.  Patient given precautions.  Patient acknowledged understanding of instructions.

## 2018-06-03 NOTE — Telephone Encounter (Signed)
Patient wants to be tested for COVID 19

## 2019-10-27 ENCOUNTER — Encounter (INDEPENDENT_AMBULATORY_CARE_PROVIDER_SITE_OTHER): Payer: Self-pay | Admitting: *Deleted

## 2019-12-26 ENCOUNTER — Ambulatory Visit: Payer: Self-pay | Admitting: Family Medicine

## 2020-01-03 ENCOUNTER — Encounter: Payer: Self-pay | Admitting: Family Medicine

## 2020-01-03 ENCOUNTER — Ambulatory Visit (INDEPENDENT_AMBULATORY_CARE_PROVIDER_SITE_OTHER): Payer: 59 | Admitting: Family Medicine

## 2020-01-03 ENCOUNTER — Other Ambulatory Visit: Payer: Self-pay

## 2020-01-03 VITALS — BP 130/72 | HR 79 | Temp 98.3°F | Ht 69.0 in | Wt 185.6 lb

## 2020-01-03 DIAGNOSIS — G4700 Insomnia, unspecified: Secondary | ICD-10-CM

## 2020-01-03 DIAGNOSIS — K5190 Ulcerative colitis, unspecified, without complications: Secondary | ICD-10-CM | POA: Diagnosis not present

## 2020-01-03 DIAGNOSIS — J449 Chronic obstructive pulmonary disease, unspecified: Secondary | ICD-10-CM

## 2020-01-03 DIAGNOSIS — K581 Irritable bowel syndrome with constipation: Secondary | ICD-10-CM | POA: Diagnosis not present

## 2020-01-03 DIAGNOSIS — Z148 Genetic carrier of other disease: Secondary | ICD-10-CM

## 2020-01-03 MED ORDER — TRELEGY ELLIPTA 100-62.5-25 MCG/INH IN AEPB: 1.0000 | INHALATION_SPRAY | Freq: Every day | RESPIRATORY_TRACT | 12 refills | Status: DC

## 2020-01-03 MED ORDER — ZOLPIDEM TARTRATE 5 MG PO TABS: 5.0000 mg | ORAL_TABLET | Freq: Every evening | ORAL | 1 refills | Status: DC | PRN

## 2020-01-03 NOTE — Patient Instructions (Addendum)
Insomnia Insomnia is a sleep disorder that makes it difficult to fall asleep or stay asleep. Insomnia can cause fatigue, low energy, difficulty concentrating, mood swings, and poor performance at work or school. There are three different ways to classify insomnia:  Difficulty falling asleep.  Difficulty staying asleep.  Waking up too early in the morning. Any type of insomnia can be long-term (chronic) or short-term (acute). Both are common. Short-term insomnia usually lasts for three months or less. Chronic insomnia occurs at least three times a week for longer than three months. What are the causes? Insomnia may be caused by another condition, situation, or substance, such as:  Anxiety.  Certain medicines.  Gastroesophageal reflux disease (GERD) or other gastrointestinal conditions.  Asthma or other breathing conditions.  Restless legs syndrome, sleep apnea, or other sleep disorders.  Chronic pain.  Menopause.  Stroke.  Abuse of alcohol, tobacco, or illegal drugs.  Mental health conditions, such as depression.  Caffeine.  Neurological disorders, such as Alzheimer's disease.  An overactive thyroid (hyperthyroidism). Sometimes, the cause of insomnia may not be known. What increases the risk? Risk factors for insomnia include:  Gender. Women are affected more often than men.  Age. Insomnia is more common as you get older.  Stress.  Lack of exercise.  Irregular work schedule or working night shifts.  Traveling between different time zones.  Certain medical and mental health conditions. What are the signs or symptoms? If you have insomnia, the main symptom is having trouble falling asleep or having trouble staying asleep. This may lead to other symptoms, such as:  Feeling fatigued or having low energy.  Feeling nervous about going to sleep.  Not feeling rested in the morning.  Having trouble concentrating.  Feeling irritable, anxious, or depressed. How  is this diagnosed? This condition may be diagnosed based on:  Your symptoms and medical history. Your health care provider may ask about: ? Your sleep habits. ? Any medical conditions you have. ? Your mental health.  A physical exam. How is this treated? Treatment for insomnia depends on the cause. Treatment may focus on treating an underlying condition that is causing insomnia. Treatment may also include:  Medicines to help you sleep.  Counseling or therapy.  Lifestyle adjustments to help you sleep better. Follow these instructions at home: Eating and drinking   Limit or avoid alcohol, caffeinated beverages, and cigarettes, especially close to bedtime. These can disrupt your sleep.  Do not eat a large meal or eat spicy foods right before bedtime. This can lead to digestive discomfort that can make it hard for you to sleep. Sleep habits   Keep a sleep diary to help you and your health care provider figure out what could be causing your insomnia. Write down: ? When you sleep. ? When you wake up during the night. ? How well you sleep. ? How rested you feel the next day. ? Any side effects of medicines you are taking. ? What you eat and drink.  Make your bedroom a dark, comfortable place where it is easy to fall asleep. ? Put up shades or blackout curtains to block light from outside. ? Use a white noise machine to block noise. ? Keep the temperature cool.  Limit screen use before bedtime. This includes: ? Watching TV. ? Using your smartphone, tablet, or computer.  Stick to a routine that includes going to bed and waking up at the same times every day and night. This can help you fall asleep faster. Consider   making a quiet activity, such as reading, part of your nighttime routine.  Try to avoid taking naps during the day so that you sleep better at night.  Get out of bed if you are still awake after 15 minutes of trying to sleep. Keep the lights down, but try reading or  doing a quiet activity. When you feel sleepy, go back to bed. General instructions  Take over-the-counter and prescription medicines only as told by your health care provider.  Exercise regularly, as told by your health care provider. Avoid exercise starting several hours before bedtime.  Use relaxation techniques to manage stress. Ask your health care provider to suggest some techniques that may work well for you. These may include: ? Breathing exercises. ? Routines to release muscle tension. ? Visualizing peaceful scenes.  Make sure that you drive carefully. Avoid driving if you feel very sleepy.  Keep all follow-up visits as told by your health care provider. This is important. Contact a health care provider if:  You are tired throughout the day.  You have trouble in your daily routine due to sleepiness.  You continue to have sleep problems, or your sleep problems get worse. Get help right away if:  You have serious thoughts about hurting yourself or someone else. If you ever feel like you may hurt yourself or others, or have thoughts about taking your own life, get help right away. You can go to your nearest emergency department or call:  Your local emergency services (911 in the [place]  A suicide crisis helpline, such as the [facility] at 2397465513. This is open 24 hours a day. Summary  Insomnia is a sleep disorder that makes it difficult to fall asleep or stay asleep.  Insomnia can be long-term (chronic) or short-term (acute).  Treatment for insomnia depends on the cause. Treatment may focus on treating an underlying condition that is causing insomnia.  Keep a sleep diary to help you and your health care provider figure out what could be causing your insomnia. This information is not intended to replace advice given to you by your health care provider. Make sure you discuss any questions you have with your health care provider. Document  Revised: 02/12/2017 Document Reviewed: 12/10/2016 Elsevier Patient Education  [place]. Irritable Bowel Syndrome, Adult  Irritable bowel syndrome (IBS) is a group of symptoms that affects the organs responsible for digestion (gastrointestinal or GI tract). IBS is not one specific disease. To regulate how the GI tract works, the body sends signals back and forth between the intestines and the brain. If you have IBS, there may be a problem with these signals. As a result, the GI tract does not function normally. The intestines may become more sensitive and overreact to certain things. This may be especially true when you eat certain foods or when you are under stress. There are four types of IBS. These may be determined based on the consistency of your stool (feces):  IBS with diarrhea.  IBS with constipation.  Mixed IBS.  Unsubtyped IBS. It is important to know which type of IBS you have. Certain treatments are more likely to be helpful for certain types of IBS. What are the causes? The exact cause of IBS is not known. What increases the risk? You may have a higher risk for IBS if you:  Are female.  Are younger than 57.  Have a family history of IBS.  Have a mental health condition, such as depression, anxiety, or  post-traumatic stress disorder.  Have had a bacterial infection of your GI tract. What are the signs or symptoms? Symptoms of IBS vary from person to person. The main symptom is abdominal pain or discomfort. Other symptoms usually include one or more of the following:  Diarrhea, constipation, or both.  Abdominal swelling or bloating.  Feeling full after eating a small or regular-sized meal.  Frequent gas.  Mucus in the stool.  A feeling of having more stool left after a bowel movement. Symptoms tend to come and go. They may be triggered by stress, mental health conditions, or certain foods. How is this diagnosed? This condition may be diagnosed based  on a physical exam, your medical history, and your symptoms. You may have tests, such as:  Blood tests.  Stool test.  X-rays.  CT scan.  Colonoscopy. This is a procedure in which your GI tract is viewed with a long, thin, flexible tube. How is this treated? There is no cure for IBS, but treatment can help relieve symptoms. Treatment depends on the type of IBS you have, and may include:  Changes to your diet, such as: ? Avoiding foods that cause symptoms. ? Drinking more water. ? Following a low-FODMAP (fermentable oligosaccharides, disaccharides, monosaccharides, and polyols) diet for up to 6 weeks, or as told by your health care provider. FODMAPs are sugars that are hard for some people to digest. ? Eating more fiber. ? Eating medium-sized meals at the same times every day.  Medicines. These may include: ? Fiber supplements, if you have constipation. ? Medicine to control diarrhea (antidiarrheal medicines). ? Medicine to help control muscle tightening (spasms) in your GI tract (antispasmodic medicines). ? Medicines to help with mental health conditions, such as antidepressants or tranquilizers.  Talk therapy or counseling.  Working with a diet and nutrition specialist (dietitian) to help create a food plan that is right for you.  Managing your stress. Follow these instructions at home: Eating and drinking  Eat a healthy diet.  Eat medium-sized meals at about the same time every day. Do not eat large meals.  Gradually eat more fiber-rich foods. These include whole grains, fruits, and vegetables. This may be especially helpful if you have IBS with constipation.  Eat a diet low in FODMAPs.  Drink enough fluid to keep your urine pale yellow.  Keep a journal of foods that seem to trigger symptoms.  Avoid foods and drinks that: ? Contain added sugar. ? Make your symptoms worse. Dairy products, caffeinated drinks, and carbonated drinks can make symptoms worse for some  people. General instructions  Take over-the-counter and prescription medicines and supplements only as told by your health care provider.  Get enough exercise. Do at least 150 minutes of moderate-intensity exercise each week.  Manage your stress. Getting enough sleep and exercise can help you manage stress.  Keep all follow-up visits as told by your health care provider and therapist. This is important. Alcohol Use  Do not drink alcohol if: ? Your health care provider tells you not to drink. ? You are pregnant, may be pregnant, or are planning to become pregnant.  If you drink alcohol, limit how much you have: ? 0-1 drink a day for women. ? 0-2 drinks a day for men.  Be aware of how much alcohol is in your drink. In the [place] one drink equals one typical bottle of beer (12 oz), one-half glass of wine (5 oz), or one shot of hard liquor (1 oz). Contact a health care  provider if you have:  Constant pain.  Weight loss.  Difficulty or pain when swallowing.  Diarrhea that gets worse. Get help right away if you have:  Severe abdominal pain.  Fever.  Diarrhea with symptoms of dehydration, such as dizziness or dry mouth.  Bright red blood in your stool.  Stool that is black and tarry.  Abdominal swelling.  Vomiting that does not stop.  Blood in your vomit. Summary  Irritable bowel syndrome (IBS) is not one specific disease. It is a group of symptoms that affects digestion.  Your intestines may become more sensitive and overreact to certain things. This may be especially true when you eat certain foods or when you are under stress.  There is no cure for IBS, but treatment can help relieve symptoms. This information is not intended to replace advice given to you by your health care provider. Make sure you discuss any questions you have with your health care provider. Document Revised: 02/23/2017 Document Reviewed: 02/23/2017 Elsevier Patient Education  2020 [place]. Ulcerative Colitis, Adult  Ulcerative colitis is long-lasting (chronic) inflammation of the large intestine (colon) and rectum. Sores (ulcers) may also form in these areas. Ulcerative colitis, along with a closely related condition called Crohn's disease, is often referred to as inflammatory bowel disease (IBD). What are the causes? This condition may be caused by increased activity of the immune system in the intestines. The immune system is the system that protects the body against harmful bacteria, viruses, fungi, and other things that can make you sick. The cause of the increased activity of the immune system is not known. What increases the risk? The following factors may make you more likely to develop this condition:  Being 66-1 years old. The risk is also increased for people who are 80-104 years old.  Having a family history of ulcerative colitis.  Being of Jewish descent. What are the signs or symptoms? Symptoms vary depending on how severe the condition is. Common symptoms include:  Rectal bleeding.  Diarrhea, often with blood or pus in the stool. Other symptoms can include:  Pain or cramping in the abdomen.  Fever.  Fatigue.  Weight loss.  Night sweats.  Rectal pain.  A strong and sudden need to have a bowel movement (bowel urgency).  Nausea.  Loss of appetite.  Anemia.  Yellowing of the skin (jaundice) from liver dysfunction.  Joint pain or soreness.  Eye irritation.  Skin rashes. Symptoms can range from mild to severe. They may come and go. How is this diagnosed? This condition may be diagnosed based on:  Your symptoms and medical history.  A physical exam.  Tests, including: ? Blood tests and stool tests. ? X-ray. ? A CT scan. ? An MRI. ? Colonoscopy. For this test, a flexible tube is inserted into your anus, and your colon is examined. ? Biopsy. In this test, a tissue sample is taken from your colon and examined under a  microscope. How is this treated? Treatment for this condition may include medicines to:  Decrease swelling and inflammation.  Control your immune system.  Treat infections.  Relieve pain.  Control diarrhea. Severe flare-ups may need to be treated at a hospital. Treatment in a hospital may involve:  Resting the bowel. This involves not eating or drinking for a period of time.  Getting medicines through an IV.  Getting fluids and nutrition through: ? An IV. ? A tube that is passed through the nose and into the stomach (nasogastric tube,  or NG tube).  Surgery to remove the affected part of the colon. This may be done if other treatments are not helping. This condition increases the risk of colon cancer. Adults with this condition will need to be watched for colon cancer throughout life. Follow these instructions at home: Medicines and vitamins  Take over-the-counter and prescription medicines only as told by your health care provider. Do not take aspirin.  If you were prescribed an antibiotic medicine, take it as told by your health care provider. Do not stop taking the antibiotic even if you start to feel better.  Ask your health care provider if you should take any vitamins or supplements. You may need to take: ? Calcium and vitamin D for bone health. ? Iron to help treat anemia. Lifestyle  Exercise regularly.  Work with your health care provider to manage your condition and educate yourself about your condition.  Do not use any products that contain nicotine or tobacco, such as cigarettes, e-cigarettes, and chewing tobacco. If you need help quitting, ask your health care provider.  If you drink alcohol: ? Limit how much you use to:  0-1 drink a day for women.  0-2 drinks a day for men. ? Be aware of how much alcohol is in your drink. In the [place] one drink equals one 12 oz bottle of beer (355 mL), one 5 oz glass of wine (148 mL), or one 1 oz glass of hard liquor (44  mL). Eating and drinking  Drink enough fluid to keep your urine pale yellow.  Ask your health care provider about the best diet for you. Follow the diet as told by your health care provider. This may include: ? Avoiding carbonated drinks. ? Avoiding popcorn, vegetable skins, nuts, and other high-fiber foods. ? Avoiding high-fat foods. ? Eating smaller meals more often. ? Limiting your intake of sugary drinks. ? Limiting your caffeine intake.  Follow food safety recommendations as told by your health care provider. This may include making sure you: ? Avoid eating raw or undercooked meat, fish, or eggs. ? Do not eat or drink spoiled or expired foods and drinks.  Keep a food diary. This may help you identify and avoid any foods that trigger your symptoms. General instructions  Wash your hands often with soap and water. If soap and water are not available, use hand sanitizer.  Stay up to date on your vaccinations, including a yearly (annual) flu shot. Ask your health care provider which vaccines you should get.  Follow recommendations from your health care provider for having cancer screening tests. Ulcerative colitis may place you at increased risk for colon cancer.  Keep all follow-up visits as told by your health care provider. This is important. Contact a health care provider if:  Your symptoms do not improve or they get worse with treatment.  You continue to lose weight.  You have constant cramps or loose stools.  You develop a new skin rash, skin sores, or eye problems.  You have a fever or chills. Get help right away if:  You have bloody diarrhea.  You have severe bleeding from the rectum.  You feel that your heart is racing (tachycardia).  You have severe pain in your abdomen.  Your abdomen swells (abdominal distension).  Your abdomen is tender to the touch.  You vomit. Summary  Ulcerative colitis is long-lasting (chronic) inflammation of the large intestine  (colon) and rectum. Sores (ulcers) may also form in these areas.  Follow instructions from  your health care provider about medicines, lifestyle changes, and eating and drinking.  Contact your health care provider if symptoms do not improve or they get worse with treatment.  Get help right away if you have severe abdominal pain, abdominal swelling, or severe bleeding from the rectum.  Keep all follow-up visits as told by your health care provider. This is important. This information is not intended to replace advice given to you by your health care provider. Make sure you discuss any questions you have with your health care provider. Document Revised: 12/20/2017 Document Reviewed: 12/22/2017 Elsevier Patient Education  [place]. Chronic Obstructive Pulmonary Disease  Chronic obstructive pulmonary disease (COPD) is a long-term (chronic) condition that affects the lungs. COPD is a general term that can be used to describe many different lung problems that cause lung swelling (inflammation) and limit airflow, including chronic bronchitis and emphysema. If you have COPD, your lung function will probably never return to normal. In most cases, it gets worse over time. However, there are steps you can take to slow the progression of the disease and improve your quality of life. What are the causes? This condition may be caused by:  Smoking. This is the most common cause.  Certain genes passed down through families. What increases the risk? The following factors may make you more likely to develop this condition:  Secondhand smoke from cigarettes, pipes, or cigars.  Exposure to chemicals and other irritants such as fumes and dust in the work environment.  Chronic lung conditions or infections. What are the signs or symptoms? Symptoms of this condition include:  Shortness of breath, especially during physical activity.  Chronic cough with a large amount of thick mucus. Sometimes the  cough may not have any mucus (dry cough).  Wheezing.  Rapid breaths.  Gray or bluish discoloration (cyanosis) of the skin, especially in your fingers, toes, or lips.  Feeling tired (fatigue).  Weight loss.  Chest tightness.  Frequent infections.  Episodes when breathing symptoms become much worse (exacerbations).  Swelling in the ankles, feet, or legs. This may occur in later stages of the disease. How is this diagnosed? This condition is diagnosed based on:  Your medical history.  A physical exam. You may also have tests, including:  Lung (pulmonary) function tests. This may include a spirometry test, which measures your ability to exhale properly.  Chest X-ray.  CT scan.  Blood tests. How is this treated? This condition may be treated with:  Medicines. These may include inhaled rescue medicines to treat acute exacerbations as well as long-term, or maintenance, medicines to prevent flare-ups of COPD. ? Bronchodilators help treat COPD by dilating the airways to allow increased airflow and make your breathing more comfortable. ? Steroids can reduce airway inflammation and help prevent exacerbations.  Smoking cessation. If you smoke, your health care provider may ask you to quit, and may also recommend therapy or replacement products to help you quit.  Pulmonary rehabilitation. This may involve working with a team of health care providers and specialists, such as respiratory, occupational, and physical therapists.  Exercise and physical activity. These are beneficial for nearly all people with COPD.  Nutrition therapy to gain weight, if you are underweight.  Oxygen. Supplemental oxygen therapy is only helpful if you have a low oxygen level in your blood (hypoxemia).  Lung surgery or transplant.  Palliative care. This is to help people with COPD feel comfortable when treatment is no longer working. Follow these instructions at home: Medicines  Take  over-the-counter and prescription medicines (inhaled or pills) only as told by your health care provider.  Talk to your health care provider before taking any cough or allergy medicines. You may need to avoid certain medicines that dry out your airways. Lifestyle  If you are a smoker, the most important thing that you can do is to stop smoking. Do not use any products that contain nicotine or tobacco, such as cigarettes and e-cigarettes. If you need help quitting, ask your health care provider. Continuing to smoke will cause the disease to progress faster.  Avoid exposure to things that irritate your lungs, such as smoke, chemicals, and fumes.  Stay active, but balance activity with periods of rest. Exercise and physical activity will help you maintain your ability to do things you want to do.  Learn and use relaxation techniques to manage stress and to control your breathing.  Get the right amount of sleep and get quality sleep. Most adults need 7 or more hours per night.  Eat healthy foods. Eating smaller, more frequent meals and resting before meals may help you maintain your strength. Controlled breathing Learn and use controlled breathing techniques as directed by your health care provider. Controlled breathing techniques include:  Pursed lip breathing. Start by breathing in (inhaling) through your nose for 1 second. Then, purse your lips as if you were going to whistle and breathe out (exhale) through the pursed lips for 2 seconds.  Diaphragmatic breathing. Start by putting one hand on your abdomen just above your waist. Inhale slowly through your nose. The hand on your abdomen should move out. Then purse your lips and exhale slowly. You should be able to feel the hand on your abdomen moving in as you exhale. Controlled coughing Learn and use controlled coughing to clear mucus from your lungs. Controlled coughing is a series of short, progressive coughs. The steps of controlled coughing  are: 1. Lean your head slightly forward. 2. Breathe in deeply using diaphragmatic breathing. 3. Try to hold your breath for 3 seconds. 4. Keep your mouth slightly open while coughing twice. 5. Spit any mucus out into a tissue. 6. Rest and repeat the steps once or twice as needed. General instructions  Make sure you receive all the vaccines that your health care provider recommends, especially the pneumococcal and influenza vaccines. Preventing infection and hospitalization is very important when you have COPD.  Use oxygen therapy and pulmonary rehabilitation if directed to by your health care provider. If you require home oxygen therapy, ask your health care provider whether you should purchase a pulse oximeter to measure your oxygen level at home.  Work with your health care provider to develop a COPD action plan. This will help you know what steps to take if your condition gets worse.  Keep other chronic health conditions under control as told by your health care provider.  Avoid extreme temperature and humidity changes.  Avoid contact with people who have an illness that spreads from person to person (is contagious), such as viral infections or pneumonia.  Keep all follow-up visits as told by your health care provider. This is important. Contact a health care provider if:  You are coughing up more mucus than usual.  There is a change in the color or thickness of your mucus.  Your breathing is more labored than usual.  Your breathing is faster than usual.  You have difficulty sleeping.  You need to use your rescue medicines or inhalers more often than expected.  You have trouble doing routine activities such as getting dressed or walking around the house. Get help right away if:  You have shortness of breath while you are resting.  You have shortness of breath that prevents you from: ? Being able to talk. ? Performing your usual physical activities.  You have chest pain  lasting longer than 5 minutes.  Your skin color is more blue (cyanotic) than usual.  You measure low oxygen saturations for longer than 5 minutes with a pulse oximeter.  You have a fever.  You feel too tired to breathe normally. Summary  Chronic obstructive pulmonary disease (COPD) is a long-term (chronic) condition that affects the lungs.  Your lung function will probably never return to normal. In most cases, it gets worse over time. However, there are steps you can take to slow the progression of the disease and improve your quality of life.  Treatment for COPD may include taking medicines, quitting smoking, pulmonary rehabilitation, and changes to diet and exercise. As the disease progresses, you may need oxygen therapy, a lung transplant, or palliative care.  To help manage your condition, do not smoke, avoid exposure to things that irritate your lungs, stay up to date on all vaccines, and follow your health care provider's instructions for taking medicines. This information is not intended to replace advice given to you by your health care provider. Make sure you discuss any questions you have with your health care provider. Document Revised: 02/12/2017 Document Reviewed: 04/06/2016 Elsevier Patient Education  2020 [employer].

## 2020-01-03 NOTE — Progress Notes (Signed)
New Patient Office Visit  Subjective:  Patient ID: Carla Bell, female    DOB: 07/24/1960  Age: 59 y.o. MRN: [id number]  CC:  Chief Complaint  Patient presents with  . New Patient (Initial Visit)    HPI Carla Bell presents to establish care. She was seen by a PCP about 2 months ago and reports that her lab work was normal. She have the following concerns today.   1. COPD Dali was diagnosed with COPD in 2013. She is also a alpha-1-antitriypsin deficiency carrier. She is currently on Symbicort BID. She is using her albuterol nebulizer BID currently. She reports shortness of breath if she walks and talks. In her house she has 6 steps before a landing, she has to stop at the landing her catch her breath. She denies cough or sputum production. Denies fever. She previously was seeing a pulmonologist when she lived in [place] but has not established with a new pulmonologist due to lack of insurance until now. She would like to be referred to a pulmonologist.   2. IBS and chron's disease Carla Bell has a hs of IBS and ulcerative colitis. She also has a hx of a bowel obstruction. She reports constipation and usually has a DM every 5 days or so. She does not take anything for constipation. She reports lower abdominal and right sided abdominal pain that is usual for her. She take methotrexate. She had a colonoscopy in 2005 with multiple polyps removed. She has not had one since. She would like to be referred to a GI doctor.    3. Insomia She reports difficulty falling asleep and staying asleep for many years. She typically gets 2-3 hours of sleep a night. She often has broken sleep. She is currently taking Remeron 45 mg nightly without relief. She previously took trazodone but reports that it would cause her to swell the following day. She would like to try a new medication for sleep.    Past Medical History:  Diagnosis Date  . Asthma   . Cancer ([place])   . COPD (chronic obstructive  pulmonary disease) ([place])   . Pericardial effusion 07/09/2015  . Pleuritic chest pain 07/09/2015    Past Surgical History:  Procedure Laterality Date  . ABDOMINAL HYSTERECTOMY     1989  . CHOLECYSTECTOMY    . FOOT SURGERY     right foot    Family History  Problem Relation Age of Onset  . Alpha-1 antitrypsin deficiency Mother   . Alpha-1 antitrypsin deficiency Daughter     Social History   Socioeconomic History  . Marital status: Married    Spouse name: Not on file  . Number of children: 3  . Years of education: 41  . Highest education level: Not on file  Occupational History  . Not on file  Tobacco Use  . Smoking status: Former Smoker    Packs/day: 0.25    Years: 2.00    Pack years: 0.50    Types: Cigarettes    Quit date: 03/17/1979    Years since quitting: 40.8  . Smokeless tobacco: Never Used  Vaping Use  . Vaping Use: Never used  Substance and Sexual Activity  . Alcohol use: No    Alcohol/week: 0.0 standard drinks  . Drug use: No  . Sexual activity: Not on file  Other Topics Concern  . Not on file  Social History Narrative   Lives with husband in a 2 story home.  Has 3 children.  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  Education: 2 years of college.   Social Determinants of Health   Financial Resource Strain:   . Difficulty of Paying Living Expenses: Not on file  Food Insecurity:   . Worried About Charity fundraiser in the Last Year: Not on file  . Ran Out of Food in the Last Year: Not on file  Transportation Needs:   . Lack of Transportation (Medical): Not on file  . Lack of Transportation (Non-Medical): Not on file  Physical Activity:   . Days of Exercise per Week: Not on file  . Minutes of Exercise per Session: Not on file  Stress:   . Feeling of Stress : Not on file  Social Connections:   . Frequency of Communication with Friends and Family: Not on file  . Frequency of Social Gatherings with Friends and Family: Not on file  .  Attends Religious Services: Not on file  . Active Member of Clubs or Organizations: Not on file  . Attends Archivist Meetings: Not on file  . Marital Status: Not on file  Intimate Partner Violence:   . Fear of Current or Ex-Partner: Not on file  . Emotionally Abused: Not on file  . Physically Abused: Not on file  . Sexually Abused: Not on file    ROS Review of Systems  Constitutional: Negative for activity change, chills, diaphoresis and fever.  HENT: Negative for trouble swallowing and voice change.   Eyes: Negative for visual disturbance.  Respiratory: Positive for shortness of breath. Negative for cough.   Cardiovascular: Negative for chest pain and leg swelling.  Gastrointestinal: Positive for abdominal pain and constipation. Negative for blood in stool, nausea, rectal pain and vomiting.  Genitourinary: Negative for difficulty urinating.  Neurological: Negative for dizziness, facial asymmetry, weakness and light-headedness.  Psychiatric/Behavioral: Positive for sleep disturbance. Negative for dysphoric mood and suicidal ideas.      Objective:   Today's Vitals: BP 130/72   Pulse 79   Temp 98.3 F (36.8 C) (Temporal)   Ht 5\' 9"  (1.753 m)   Wt 185 lb 9.6 oz (84.2 kg)   SpO2 93%   BMI 27.41 kg/m   Physical Exam Vitals and nursing note reviewed.  Constitutional:      General: She is not in acute distress.    Appearance: Normal appearance. She is not ill-appearing or diaphoretic.  HENT:     Head: Normocephalic and atraumatic.     Right Ear: Tympanic membrane, ear canal and external ear normal.     Left Ear: Tympanic membrane, ear canal and external ear normal.     Nose: Nose normal.     Mouth/Throat:     Mouth: Mucous membranes are moist.     Pharynx: Oropharynx is clear.  Eyes:     Extraocular Movements: Extraocular movements intact.     Pupils: Pupils are equal, round, and reactive to light.  Cardiovascular:     Rate and Rhythm: Normal rate and regular  rhythm.     Heart sounds: Normal heart sounds. No murmur heard.   Pulmonary:     Effort: Pulmonary effort is normal. No respiratory distress.     Breath sounds: Normal breath sounds.  Abdominal:     General: Bowel sounds are normal. There is no distension.     Palpations: Abdomen is soft. There is no mass.     Tenderness: There is abdominal tenderness (mild generalized tenderness). There is no guarding or rebound.  Hernia: No hernia is present.  Musculoskeletal:     Cervical back: Neck supple. No tenderness.     Right lower leg: No edema.     Left lower leg: No edema.  Lymphadenopathy:     Cervical: No cervical adenopathy.  Skin:    General: Skin is warm and dry.  Neurological:     General: No focal deficit present.     Mental Status: She is alert and oriented to person, place, and time.  Psychiatric:        Mood and Affect: Mood normal.        Behavior: Behavior normal.     Assessment & Plan:  Carla Bell was seen today for new patient (initial visit).  Diagnoses and all orders for this visit:  COPD GOLD II/ MZ/ quit smoking 1981  Discontinue Symbicort, start Trelegy ellipta. Referral placed to pulmonology. -     Fluticasone-Umeclidin-Vilant (TRELEGY ELLIPTA) 100-62.5-25 MCG/INH AEPB; Inhale 1 puff into the lungs daily. -     Ambulatory referral to Pulmonology  Alpha-1-antitrypsin deficiency carrier -     Ambulatory referral to Pulmonology  Irritable bowel syndrome with constipation Discussed OTC measures and life style changes. Handout given.  -     Ambulatory referral to Gastroenterology  Ulcerative colitis without complications, unspecified location [facility]) -     Ambulatory referral to Gastroenterology  Insomnia, unspecified type Will trial Ambien for sleep. No refills given at this time as patient has not signed a controlled substance agreement. Discussed with patient that if she wishes to continue Ambien she will need to return in 1 month to sign contract and for  toxassure.  -     zolpidem (AMBIEN) 5 MG tablet; Take 1 tablet (5 mg total) by mouth at bedtime as needed for sleep.  Follow-up: Follow up in 1 month for insomnia. Return to office for new or worsening symptoms, or if symptoms persist.   The above assessment and management plan was discussed with the patient. The patient verbalized understanding of and has agreed to the management plan. Patient is aware to call the clinic if they develop any new symptoms or if symptoms fail to improve or worsen. Patient is aware when to return to the clinic for a follow-up visit. Patient educated on when it is appropriate to go to the emergency department.     Gwenlyn Perking, FNP

## 2020-02-15 ENCOUNTER — Ambulatory Visit (INDEPENDENT_AMBULATORY_CARE_PROVIDER_SITE_OTHER): Payer: 59

## 2020-02-15 ENCOUNTER — Encounter: Payer: Self-pay | Admitting: Pulmonary Disease

## 2020-02-15 ENCOUNTER — Ambulatory Visit: Payer: 59 | Admitting: Pulmonary Disease

## 2020-02-15 ENCOUNTER — Other Ambulatory Visit: Payer: Self-pay

## 2020-02-15 VITALS — BP 134/84 | HR 74 | Temp 97.5°F | Ht 68.0 in | Wt 180.6 lb

## 2020-02-15 DIAGNOSIS — R0609 Other forms of dyspnea: Secondary | ICD-10-CM

## 2020-02-15 DIAGNOSIS — Z148 Genetic carrier of other disease: Secondary | ICD-10-CM

## 2020-02-15 DIAGNOSIS — R0600 Dyspnea, unspecified: Secondary | ICD-10-CM

## 2020-02-15 NOTE — Patient Instructions (Signed)
History of alpha-1 deficiency  -Obtain alpha-1 levels  -Obtain PFT -Chest x-ray  Dyspnea on exertion -Obtain echocardiogram  I will see you in about 3 months  Continue with graded exercises as tolerated  Continue Trelegy and albuterol  Call with significant concerns

## 2020-02-15 NOTE — Progress Notes (Signed)
Carla Bell    [id number]    08-02-1960  Primary Care Physician:Morgan, Arman Bogus, FNP  Referring Physician: Gwenlyn Perking, [address] [place],  [place] [address]  Chief complaint:   Patient with a history of alpha-1 deficiency Shortness of breath on exertion  HPI:  History of alpha-1 deficiency Has tried multiple inhalers Recently started Trelegy and Trelegy seems to be working well Albuterol uses about every other day  She is active, exercises on a treadmill regularly  Alpha-1 levels when was last checked was about 85  PFT previously did show significant bronchodilator response  Very remote history of social smoking  Family history of lung disease  Outpatient Encounter Medications as of 02/15/2020  Medication Sig  . albuterol (PROVENTIL) (2.5 MG/3ML) 0.083% nebulizer solution Take 3 mLs (2.5 mg total) by nebulization every 4 (four) hours as needed for wheezing or shortness of breath.  . Fluticasone-Umeclidin-Vilant (TRELEGY ELLIPTA) 100-62.5-25 MCG/INH AEPB Inhale 1 puff into the lungs daily.  . folic acid (FOLVITE) 1 MG tablet Take 1 mg by mouth daily.  . mesalamine (LIALDA) 1.2 g EC tablet Take by mouth at bedtime.  . methotrexate 2.5 MG tablet   . zolpidem (AMBIEN) 5 MG tablet Take 1 tablet (5 mg total) by mouth at bedtime as needed for sleep.  . mirtazapine (REMERON) 15 MG tablet Take 15 mg by mouth at bedtime. (Patient not taking: Reported on 02/15/2020)   Facility-Administered Encounter Medications as of 02/15/2020  Medication  . betamethasone acetate-betamethasone sodium phosphate (CELESTONE) injection 3 mg  . betamethasone acetate-betamethasone sodium phosphate (CELESTONE) injection 3 mg    Allergies as of 02/15/2020 - Review Complete 02/15/2020  Allergen Reaction Noted  . Demerol [meperidine] Nausea Only 12/16/2016    Past Medical History:  Diagnosis Date  . Asthma   . Cancer ([place])   . COPD (chronic obstructive pulmonary  disease) ([place])   . Pericardial effusion 07/09/2015  . Pleuritic chest pain 07/09/2015    Past Surgical History:  Procedure Laterality Date  . ABDOMINAL HYSTERECTOMY     1989  . CHOLECYSTECTOMY    . FOOT SURGERY     right foot    Family History  Problem Relation Age of Onset  . Alpha-1 antitrypsin deficiency Mother   . Alpha-1 antitrypsin deficiency Daughter     Social History   Socioeconomic History  . Marital status: Married    Spouse name: Not on file  . Number of children: 3  . Years of education: 6  . Highest education level: Not on file  Occupational History  . Not on file  Tobacco Use  . Smoking status: Former Smoker    Packs/day: 0.25    Years: 2.00    Pack years: 0.50    Types: Cigarettes    Quit date: 03/17/1979    Years since quitting: 40.9  . Smokeless tobacco: Never Used  Vaping Use  . Vaping Use: Never used  Substance and Sexual Activity  . Alcohol use: No    Alcohol/week: 0.0 standard drinks  . Drug use: No  . Sexual activity: Not on file  Other Topics Concern  . Not on file  Social History Narrative   Lives with husband in a 2 story home.  Has 3 children.  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  Education: 2 years of college.   Social Determinants of Health   Financial Resource Strain:   . Difficulty of Paying  Living Expenses: Not on file  Food Insecurity:   . Worried About Charity fundraiser in the Last Year: Not on file  . Ran Out of Food in the Last Year: Not on file  Transportation Needs:   . Lack of Transportation (Medical): Not on file  . Lack of Transportation (Non-Medical): Not on file  Physical Activity:   . Days of Exercise per Week: Not on file  . Minutes of Exercise per Session: Not on file  Stress:   . Feeling of Stress : Not on file  Social Connections:   . Frequency of Communication with Friends and Family: Not on file  . Frequency of Social Gatherings with Friends and Family: Not on file  . Attends  Religious Services: Not on file  . Active Member of Clubs or Organizations: Not on file  . Attends Archivist Meetings: Not on file  . Marital Status: Not on file  Intimate Partner Violence:   . Fear of Current or Ex-Partner: Not on file  . Emotionally Abused: Not on file  . Physically Abused: Not on file  . Sexually Abused: Not on file    Review of Systems  Constitutional: Negative for fatigue.  Respiratory: Positive for shortness of breath.     Vitals:   02/15/20 1503  BP: 134/84  Pulse: 74  Temp: (!) 97.5 F (36.4 C)  SpO2: 98%     Physical Exam Constitutional:      Appearance: Normal appearance.  HENT:     Head: Normocephalic and atraumatic.     Mouth/Throat:     Mouth: Mucous membranes are moist.  Eyes:     General:        Right eye: No discharge.        Left eye: No discharge.  Cardiovascular:     Rate and Rhythm: Normal rate and regular rhythm.     Pulses: Normal pulses.     Heart sounds: Normal heart sounds. No murmur heard.  No friction rub.  Pulmonary:     Effort: No respiratory distress.     Breath sounds: No stridor. No wheezing or rhonchi.  Musculoskeletal:     Cervical back: No rigidity or tenderness.  Neurological:     Mental Status: She is alert.  Psychiatric:        Mood and Affect: Mood normal.      Data Reviewed: Previous PFT from 2017 reviewed showing mild obstructive lung disease with significant bronchodilator response  Previous chest x-ray with hyperinflated lung fields-reviewed by myself  Assessment:  Dyspnea on exertion History of obstructive lung disease  History of alpha-1 deficiency  Trelegy seems to have made a difference-tolerates it well and seems to be helping shortness of breath  Past history of pericardial effusion   Plan/Recommendations: Importance of graded exercises discussed with patient  We will obtain alpha-1 levels  Obtain chest x-ray  Obtain a pulmonary function test to compare with  previous  Echocardiogram for shortness of breath  I will follow-up with you in about 3 months  Encouraged to call with any significant concerns    Sherrilyn Rist MD [facility] 02/15/2020, 3:27 PM  CC: Gwenlyn Perking, FNP

## 2020-02-20 ENCOUNTER — Other Ambulatory Visit: Payer: 59

## 2020-02-21 ENCOUNTER — Other Ambulatory Visit: Payer: Self-pay

## 2020-02-21 MED ORDER — FOLIC ACID 1 MG PO TABS
1.0000 mg | ORAL_TABLET | Freq: Every day | ORAL | 0 refills | Status: DC
Start: 2020-02-21 — End: 2020-04-04

## 2020-02-21 MED ORDER — MESALAMINE 1.2 G PO TBEC
1.2000 g | DELAYED_RELEASE_TABLET | Freq: Every day | ORAL | 0 refills | Status: DC
Start: 2020-02-21 — End: 2020-04-04

## 2020-02-21 MED ORDER — METHOTREXATE SODIUM 2.5 MG PO TABS
2.5000 mg | ORAL_TABLET | ORAL | 0 refills | Status: DC
Start: 2020-02-21 — End: 2020-02-26

## 2020-02-21 NOTE — Telephone Encounter (Signed)
Refills sent to pharmacy.  Unable to reach patient, no answer, no voicemail.

## 2020-02-21 NOTE — Telephone Encounter (Signed)
Ok to refill for 1 month until her GI appointment in January.

## 2020-02-21 NOTE — Telephone Encounter (Signed)
Ok to fill? She was supposed to follow up in 1 month and did not. Please advise

## 2020-02-21 NOTE — Telephone Encounter (Signed)
  Prescription Request  02/21/2020  What is the name of the medication or equipment?  methotrexate 2.5 MG tablet mesalamine (LIALDA) 1.2 g EC tablet folic acid (FOLVITE) 1 MG tablet  Have you contacted your pharmacy to request a refill? (if applicable) no new rx--she is a new pt  Which pharmacy would you like this sent to? [facility]   Patient notified that their request is being sent to the clinical staff for review and that they should receive a response within 2 business days.

## 2020-02-22 ENCOUNTER — Telehealth: Payer: Self-pay | Admitting: Pulmonary Disease

## 2020-02-22 NOTE — Telephone Encounter (Signed)
Patient was informed, nothing further needed

## 2020-02-23 ENCOUNTER — Ambulatory Visit (INDEPENDENT_AMBULATORY_CARE_PROVIDER_SITE_OTHER): Payer: 59 | Admitting: Pulmonary Disease

## 2020-02-23 ENCOUNTER — Other Ambulatory Visit: Payer: Self-pay

## 2020-02-23 DIAGNOSIS — R0600 Dyspnea, unspecified: Secondary | ICD-10-CM

## 2020-02-23 DIAGNOSIS — R0609 Other forms of dyspnea: Secondary | ICD-10-CM

## 2020-02-23 NOTE — Progress Notes (Addendum)
02/23/20  Peer to peer: Peer to peer provider: Dr. Eula Fried Completing on behalf of Dr. Nat Math, FNP  Chart review: Echocardiogram was ordered.  This is based off 02/15/2020 office visit to evaluate for dyspnea on exertion.  Patient with a history of obstructive lung disease as well as history of alpha-1 deficiency.  Also a previous history of pericardial effusion.  An echocardiogram was ordered to evaluate for shortness of breath.  Plan:  Pt is approved with 2D echocardiogram  Will route to PCCs as fyi   Wyn Quaker, FNP

## 2020-02-26 ENCOUNTER — Telehealth: Payer: Self-pay

## 2020-02-26 ENCOUNTER — Other Ambulatory Visit: Payer: Self-pay | Admitting: Family Medicine

## 2020-02-26 DIAGNOSIS — K5190 Ulcerative colitis, unspecified, without complications: Secondary | ICD-10-CM

## 2020-02-26 DIAGNOSIS — G4700 Insomnia, unspecified: Secondary | ICD-10-CM

## 2020-02-26 MED ORDER — ZOLPIDEM TARTRATE 5 MG PO TABS
5.0000 mg | ORAL_TABLET | Freq: Every evening | ORAL | 0 refills | Status: DC | PRN
Start: 2020-02-26 — End: 2020-03-13

## 2020-02-26 MED ORDER — METHOTREXATE SODIUM 2.5 MG PO TABS: 15.0000 mg | ORAL_TABLET | ORAL | 0 refills | Status: DC

## 2020-02-26 NOTE — Telephone Encounter (Signed)
  Prescription Request  02/26/2020  What is the name of the medication or equipment? Ambien RX  Have you contacted your pharmacy to request a refill? (if applicable) no  Which pharmacy would you like this sent to? [facility]   Patient notified that their request is being sent to the clinical staff for review and that they should receive a response within 2 business days.   Tiffany Morgan's pt.  She has an appt Dec 29.

## 2020-02-26 NOTE — Telephone Encounter (Signed)
Patient aware and verbalizes understanding. 

## 2020-02-26 NOTE — Telephone Encounter (Signed)
Sent in methotrexate 2.5 mg 6 pills weekly and ambien Rx for one month each.

## 2020-02-26 NOTE — Telephone Encounter (Signed)
Please advise 

## 2020-03-01 NOTE — Telephone Encounter (Signed)
Pt called - [facility] and insurance is not wanting to fill med since #4 was already picked up   Was written and picked up by accident for 1 a week #4.  Was supposed to be #6 weekly - so #24 was sent in correctly and now can't fill  I have a call into [facility] - they are going to call insurance and then call me back.

## 2020-03-01 NOTE — Telephone Encounter (Signed)
[facility] called and got this override  Pt aware = med ready for pick up

## 2020-03-06 ENCOUNTER — Ambulatory Visit (INDEPENDENT_AMBULATORY_CARE_PROVIDER_SITE_OTHER): Payer: 59

## 2020-03-06 DIAGNOSIS — R0609 Other forms of dyspnea: Secondary | ICD-10-CM

## 2020-03-06 DIAGNOSIS — R0600 Dyspnea, unspecified: Secondary | ICD-10-CM | POA: Diagnosis not present

## 2020-03-06 LAB — ECHOCARDIOGRAM COMPLETE
Area-P 1/2: 4.63 cm2
Calc EF: 66.9 %
MV M vel: 2.5 m/s
MV Peak grad: 25.1 mmHg
S' Lateral: 2.37 cm
Single Plane A2C EF: 59.7 %
Single Plane A4C EF: 71.9 %

## 2020-03-08 LAB — ALPHA-1 ANTITRYPSIN PHENOTYPE: A-1 Antitrypsin, Ser: 107 mg/dL (ref 83–199)

## 2020-03-13 ENCOUNTER — Encounter: Payer: Self-pay | Admitting: Family Medicine

## 2020-03-13 ENCOUNTER — Other Ambulatory Visit: Payer: Self-pay

## 2020-03-13 ENCOUNTER — Ambulatory Visit (INDEPENDENT_AMBULATORY_CARE_PROVIDER_SITE_OTHER): Payer: 59 | Admitting: Family Medicine

## 2020-03-13 VITALS — BP 122/65 | HR 57 | Temp 98.1°F | Ht 68.0 in | Wt 183.2 lb

## 2020-03-13 DIAGNOSIS — Z79899 Other long term (current) drug therapy: Secondary | ICD-10-CM

## 2020-03-13 DIAGNOSIS — G4700 Insomnia, unspecified: Secondary | ICD-10-CM

## 2020-03-13 DIAGNOSIS — R040 Epistaxis: Secondary | ICD-10-CM

## 2020-03-13 DIAGNOSIS — Z148 Genetic carrier of other disease: Secondary | ICD-10-CM

## 2020-03-13 DIAGNOSIS — Z6827 Body mass index (BMI) 27.0-27.9, adult: Secondary | ICD-10-CM

## 2020-03-13 DIAGNOSIS — K5190 Ulcerative colitis, unspecified, without complications: Secondary | ICD-10-CM

## 2020-03-13 DIAGNOSIS — K581 Irritable bowel syndrome with constipation: Secondary | ICD-10-CM | POA: Diagnosis not present

## 2020-03-13 DIAGNOSIS — J449 Chronic obstructive pulmonary disease, unspecified: Secondary | ICD-10-CM

## 2020-03-13 MED ORDER — ZOLPIDEM TARTRATE 10 MG PO TABS
10.0000 mg | ORAL_TABLET | Freq: Every day | ORAL | 5 refills | Status: DC
Start: 2020-03-13 — End: 2020-09-26

## 2020-03-13 NOTE — Patient Instructions (Signed)
Nosebleed  A nosebleed is when blood comes out of the nose. Nosebleeds are common. They are usually not a sign of a serious medical problem. Follow these instructions at home: When you have a nosebleed:  Sit down.  Tilt your head a little forward.  Follow these steps: 1. Pinch your nose with a clean towel or tissue. 2. Keep pinching your nose for 10 minutes. Do not let go. 3. After 10 minutes, let go of your nose. 4. If there is still bleeding, do these steps again. Keep doing these steps until the bleeding stops.  Do not put things in your nose to stop the bleeding.  Try not to lie down or put your head back.  Use a nose spray decongestant as told by your doctor.  Do not use petroleum jelly or mineral oil in your nose. These things can get into your lungs. After a nosebleed:  Try not to blow your nose or sniffle for several hours.  Try not to strain, lift, or bend at the waist for several days.  Use saline spray or a humidifier as told by your doctor.  Aspirin and blood-thinning medicines make bleeding more likely. If you take these medicines, ask your doctor if you should stop taking them, or if you should change how much you take. Do not stop taking the medicine unless your doctor tells you to. Contact a doctor if:  You have a fever.  You get nosebleeds often.  You are getting nosebleeds more often than usual.  You bruise very easily.  You have something stuck in your nose.  You have bleeding in your mouth.  You throw up (vomit) or cough up brown material.  You get a nosebleed after you start a new medicine. Get help right away if:  You have a nosebleed after you fall or hurt your head.  Your nosebleed does not go away after 20 minutes.  You feel dizzy or weak.  You have unusual bleeding from other parts of your body.  You have unusual bruising on other parts of your body.  You get sweaty.  You throw up blood. Summary  Nosebleeds are common. They  are usually not a sign of a serious medical problem.  When you have a nosebleed, sit down and tilt your head a little forward. Pinch your nose with a clean tissue.  After the bleeding stops, try not to blow your nose or sniffle for several hours. This information is not intended to replace advice given to you by your health care provider. Make sure you discuss any questions you have with your health care provider. Document Revised: 02/12/2017 Document Reviewed: 06/12/2016 Elsevier Patient Education  2020 [employer]  

## 2020-03-13 NOTE — Progress Notes (Signed)
Subjective: CC: chronic follow up PCP: Carla Perking, FNP  GYB:WLSLHTDS [name] is a 59 y.o. female presenting to clinic today for:  1. Frequent nosebleeds Carla has had occasional nose bleeds over the last 3 months. Recently she had 6 in the last week. She denies sores in her nose. She does not use nasal sprays. Denies seasonal allergies. She has been using some Vaseline in her nose. The last one lasted for 20 minutes but she was not sitting down and applying pressure consistently.   2. COPD Saw pulmonology and had a good visit. She had an echo done last Wednesday that was normal. She is using Trelegy Ellipta daily and albuterol as needed. Reports improvement in shortness of breath.   3.  IBS/Chron's  She will see gastro next week. She is currently taking mesalamine and methotrexate. Also taking folic acid.   4. Insomnia Carla [name] reports that Ambien 5 mg has been helping. She has been falling asleep easily however she continues to wake up multiple times a night.   Relevant past medical, surgical, family, and social history reviewed and updated as indicated.  Allergies and medications reviewed and updated.  Allergies  Allergen Reactions  . Demerol [Meperidine] Nausea Only   Past Medical History:  Diagnosis Date  . Asthma   . Cancer ([place])   . COPD (chronic obstructive pulmonary disease) ([place])   . Pericardial effusion 07/09/2015  . Pleuritic chest pain 07/09/2015    Current Outpatient Medications:  .  albuterol (PROVENTIL) (2.5 MG/3ML) 0.083% nebulizer solution, Take 3 mLs (2.5 mg total) by nebulization every 4 (four) hours as needed for wheezing or shortness of breath., Disp: 75 mL, Rfl: 6 .  Fluticasone-Umeclidin-Vilant (TRELEGY ELLIPTA) 100-62.5-25 MCG/INH AEPB, Inhale 1 puff into the lungs daily., Disp: 28 each, Rfl: 12 .  folic acid (FOLVITE) 1 MG tablet, Take 1 tablet (1 mg total) by mouth daily., Disp: 30 tablet, Rfl: 0 .  mesalamine (LIALDA) 1.2 g EC tablet,  Take 1 tablet (1.2 g total) by mouth at bedtime., Disp: 30 tablet, Rfl: 0 .  methotrexate 2.5 MG tablet, Take 6 tablets (15 mg total) by mouth once a week for 28 days., Disp: 24 tablet, Rfl: 0 .  zolpidem (AMBIEN) 5 MG tablet, Take 1 tablet (5 mg total) by mouth at bedtime as needed for sleep., Disp: 30 tablet, Rfl: 0  Current Facility-Administered Medications:  .  betamethasone acetate-betamethasone sodium phosphate (CELESTONE) injection 3 mg, 3 mg, Intramuscular, Once, Evans, Brent M, DPM .  betamethasone acetate-betamethasone sodium phosphate (CELESTONE) injection 3 mg, 3 mg, Intramuscular, Once, Edrick Kins, DPM Social History   Socioeconomic History  . Marital status: Married    Spouse name: Not on file  . Number of children: 3  . Years of education: 81  . Highest education level: Not on file  Occupational History  . Not on file  Tobacco Use  . Smoking status: Former Smoker    Packs/day: 0.25    Years: 2.00    Pack years: 0.50    Types: Cigarettes    Quit date: 03/17/1979    Years since quitting: 41.0  . Smokeless tobacco: Never Used  Vaping Use  . Vaping Use: Never used  Substance and Sexual Activity  . Alcohol use: No    Alcohol/week: 0.0 standard drinks  . Drug use: No  . Sexual activity: Not on file  Other Topics Concern  . Not on file  Social History Narrative   Lives with husband in a  2 story home.  Has 3 children.  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  Education: 2 years of college.   Social Determinants of Health   Financial Resource Strain: Not on file  Food Insecurity: Not on file  Transportation Needs: Not on file  Physical Activity: Not on file  Stress: Not on file  Social Connections: Not on file  Intimate Partner Violence: Not on file   Family History  Problem Relation Age of Onset  . Alpha-1 antitrypsin deficiency Mother   . Alpha-1 antitrypsin deficiency Daughter     Review of Systems  Negative unless specially indicated  above in HPI.  Objective: Office vital signs reviewed. BP 122/65   Pulse (!) 57   Temp 98.1 F (36.7 C) (Temporal)   Ht _0  (1.727 m)   Wt 183 lb 4 oz (83.1 kg)   BMI 27.86 kg/m   Physical Examination:  Physical Exam Vitals and nursing note reviewed.  Constitutional:      General: She is not in acute distress.    Appearance: She is not ill-appearing, toxic-appearing or diaphoretic.  HENT:     Right Ear: Tympanic membrane, ear canal and external ear normal.     Left Ear: Tympanic membrane, ear canal and external ear normal.     Nose: No nasal deformity, septal deviation, laceration, nasal tenderness, mucosal edema, congestion or rhinorrhea.     Right Nostril: No epistaxis or septal hematoma.     Left Nostril: No epistaxis or septal hematoma.     Right Turbinates: Not enlarged, swollen or pale.     Left Turbinates: Not enlarged, swollen or pale.  Eyes:     Conjunctiva/sclera: Conjunctivae normal.     Pupils: Pupils are equal, round, and reactive to light.  Cardiovascular:     Rate and Rhythm: Normal rate and regular rhythm.     Heart sounds: Normal heart sounds. No murmur heard.   Pulmonary:     Effort: Pulmonary effort is normal. No respiratory distress.     Breath sounds: Normal breath sounds.  Musculoskeletal:     Right lower leg: No edema.     Left lower leg: No edema.  Skin:    General: Skin is warm and dry.  Neurological:     General: No focal deficit present.     Mental Status: She is alert and oriented to person, place, and time.  Psychiatric:        Mood and Affect: Mood normal.        Behavior: Behavior normal.        Thought Content: Thought content normal.        Judgment: Judgment normal.      Results for orders placed or performed in visit on 03/06/20  ECHOCARDIOGRAM COMPLETE  Result Value Ref Range   S' Lateral 2.37 cm   Single Plane A4C EF 71.9 %   Single Plane A2C EF 59.7 %   Calc EF 66.9 %   Area-P 1/2 4.63 cm2   MV M vel 2.50 m/s   MV  Peak grad 25.1 mmHg     Assessment/ Plan: Carla [name] was seen today for medical management of chronic issues.  Diagnoses and all orders for this visit:  Insomnia, unspecified type/Controlled substance agreement signed Signed agreement today for Ambien. Toxassure today.  -     zolpidem (AMBIEN) 10 MG tablet; Take 1 tablet (10 mg total) by mouth at bedtime. -     ToxASSURE Select 13 (MW),  Urine  Ulcerative colitis without complications, unspecified location (HCC)/Irritable bowel syndrome with constipation Has appointment with GI next week.   COPD GOLD II/ MZ/ quit smoking 1981 Well controlled on current regimen.   Alpha-1-antitrypsin deficiency carrier Managed by pulmonology  Frequent nosebleeds Normal nose exam. Discussed daily saline nasal spray for moisture. Vaseline as needed. Handout given.   BMI 27.0-27.9,adult Labs pending as below.  -     CBC with Differential/Platelet -     CMP14+EGFR -     Lipid panel -     Thyroid Panel With TSH  Follow up in 6 months for chronic illnesses.  The above assessment and management plan was discussed with the patient. The patient verbalized understanding of and has agreed to the management plan. Patient is aware to call the clinic if symptoms persist or worsen. Patient is aware when to return to the clinic for a follow-up visit. Patient educated on when it is appropriate to go to the emergency department.   Marjorie Smolder, FNP-C [facility] [address] [place], [place] [address] [phone]

## 2020-03-14 LAB — CBC WITH DIFFERENTIAL/PLATELET
Basophils Absolute: 0 10*3/uL (ref 0.0–0.2)
Basos: 0 %
EOS (ABSOLUTE): 0.2 10*3/uL (ref 0.0–0.4)
Eos: 2 %
Hematocrit: 36.1 % (ref 34.0–46.6)
Hemoglobin: 12 g/dL (ref 11.1–15.9)
Immature Grans (Abs): 0 10*3/uL (ref 0.0–0.1)
Immature Granulocytes: 0 %
Lymphocytes Absolute: 1.7 10*3/uL (ref 0.7–3.1)
Lymphs: 24 %
MCH: 31.3 pg (ref 26.6–33.0)
MCHC: 33.2 g/dL (ref 31.5–35.7)
MCV: 94 fL (ref 79–97)
Monocytes Absolute: 0.4 10*3/uL (ref 0.1–0.9)
Monocytes: 5 %
Neutrophils Absolute: 4.9 10*3/uL (ref 1.4–7.0)
Neutrophils: 69 %
Platelets: 263 10*3/uL (ref 150–450)
RBC: 3.84 x10E6/uL (ref 3.77–5.28)
RDW: 13 % (ref 11.7–15.4)
WBC: 7.1 10*3/uL (ref 3.4–10.8)

## 2020-03-14 LAB — LIPID PANEL
Chol/HDL Ratio: 3 ratio (ref 0.0–4.4)
Cholesterol, Total: 178 mg/dL (ref 100–199)
HDL: 60 mg/dL (ref >39)
LDL Chol Calc (NIH): 102 mg/dL — ABNORMAL HIGH (ref 0–99)
Triglycerides: 87 mg/dL (ref 0–149)
VLDL Cholesterol Cal: 16 mg/dL (ref 5–40)

## 2020-03-14 LAB — CMP14+EGFR
ALT: 21 IU/L (ref 0–32)
AST: 19 IU/L (ref 0–40)
Albumin/Globulin Ratio: 2.6 — ABNORMAL HIGH (ref 1.2–2.2)
Albumin: 4.4 g/dL (ref 3.8–4.8)
Alkaline Phosphatase: 93 IU/L (ref 44–121)
BUN/Creatinine Ratio: 18 (ref 12–28)
BUN: 13 mg/dL (ref 8–27)
Bilirubin Total: 0.2 mg/dL (ref 0.0–1.2)
CO2: 27 mmol/L (ref 20–29)
Calcium: 9.1 mg/dL (ref 8.7–10.3)
Chloride: 101 mmol/L (ref 96–106)
Creatinine, Ser: 0.74 mg/dL (ref 0.57–1.00)
GFR calc Af Amer: 101 mL/min/{1.73_m2} (ref >59)
GFR calc non Af Amer: 88 mL/min/{1.73_m2} (ref >59)
Globulin, Total: 1.7 g/dL (ref 1.5–4.5)
Glucose: 78 mg/dL (ref 65–99)
Potassium: 3.9 mmol/L (ref 3.5–5.2)
Sodium: 140 mmol/L (ref 134–144)
Total Protein: 6.1 g/dL (ref 6.0–8.5)

## 2020-03-14 LAB — THYROID PANEL WITH TSH
Free Thyroxine Index: 1.9 (ref 1.2–4.9)
T3 Uptake Ratio: 22 % — ABNORMAL LOW (ref 24–39)
T4, Total: 8.6 ug/dL (ref 4.5–12.0)
TSH: 2.29 u[IU]/mL (ref 0.450–4.500)

## 2020-03-19 ENCOUNTER — Encounter: Payer: Self-pay | Admitting: Gastroenterology

## 2020-03-19 ENCOUNTER — Ambulatory Visit: Payer: 59 | Admitting: Gastroenterology

## 2020-03-19 VITALS — BP 130/68 | HR 64 | Ht 67.0 in | Wt 183.5 lb

## 2020-03-19 DIAGNOSIS — R109 Unspecified abdominal pain: Secondary | ICD-10-CM

## 2020-03-19 DIAGNOSIS — R110 Nausea: Secondary | ICD-10-CM | POA: Diagnosis not present

## 2020-03-19 MED ORDER — NA SULFATE-K SULFATE-MG SULF 17.5-3.13-1.6 GM/177ML PO SOLN: ORAL | 0 refills | Status: DC

## 2020-03-19 NOTE — Patient Instructions (Addendum)
I need to get more information to determine how to be most helpful to you. I'd like to get your records from [place] and [place]. We will attempt to obtain records if able.  I am also recommending an EGD, and Colonoscopy.  ENDOSCOPY AND COLONOSCOPY: You have been scheduled for an endoscopy and colonoscopy. Please follow written instructions given to you at your visit today.   PREP: Please pick up your prep supplies at the pharmacy within the next 1-3 days.  INHALERS: If you use inhalers (even only as needed), please bring them with you on the day of your procedure.  To evaluate your pain, I am going to recommend a CT of the abdomen and pelvis.   You have been scheduled for a CT scan of the abdomen and pelvis at [facility], 1st floor Radiology. You are scheduled on 03/25/20  at 7:30am. Please arrive at 7:15am for registration.    Please pick up 2 bottles of contrast from [place] at least 3 days prior to your scan. The solution may taste better if refrigerated, but do NOT add ice or any other liquid to this solution. Shake well before drinking.   Please follow the written instructions below on the day of your exam:   1) Do not eat anything after 3:30am (4 hours prior to your test)    NOTE - Remember to shake well before drinking and DO NOT pour over ice.  2) Drink 1 bottle of contrast @ 5:30am (2 hours prior to your exam)    3) Drink 1 bottle of contrast @ 6:30am (1 hour prior to your exam)   You may take any medications as prescribed with a small amount of water, if necessary. If you take any of the following medications: METFORMIN, GLUCOPHAGE, GLUCOVANCE, AVANDAMET, RIOMET, FORTAMET, [place] MET, JANUMET, GLUMETZA or METAGLIP, you MAY be asked to HOLD this medication 48 hours AFTER the exam.   The purpose of you drinking the oral contrast is to aid in the visualization of your intestinal tract. The contrast solution may cause some diarrhea. Depending on your individual set  of symptoms, you may also receive an intravenous injection of x-ray contrast/dye. Plan on being at [facility] for 45 minutes or longer, depending on the type of exam you are having performed.   If you have any questions regarding your exam or if you need to reschedule, you may call [facility] at [phone] between the hours of 8:00 am and 5:00 pm, Monday-Friday.   If you are age 68 or younger, your body mass index should be between 19-25. Your Body mass index is 28.74 kg/m. If this is out of the aformentioned range listed, please consider follow up with your Primary Care Provider.   Thank you for trusting me with your gastrointestinal care!    Thornton Park, MD, MPH

## 2020-03-19 NOTE — Progress Notes (Signed)
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 Restage her underlying/baseline GI disease with EGD, colonoscopy, and contrasted cross-sectional imaging.  At this time, I do not feel that there is a GI indication for treatment with mesalamine, weekly methotrexate, and folic acid as started by a doctor in [place]. She should review the indication with the prescribing provider.   PLAN: - Obtain prior records from [place] [facility] [place] has closed their doors. Dr. Julien Nordmann no longer in practice. Unable to obtain records from either location) - Will request previous colonoscopy and pathology from [facility] (previous records scanned in to Epic) - PCP in [place] (Dr. Lia Hopping) - Obtain labs from PCP last week - (ordered by Harlow Mares and found in Epic) - CT abd/pelvis with contrast - EGD and colonoscopy   Please see the "Patient Instructions" section for addition details about the plan.  HPI: Carla Bell is a 60 y.o. female referred by NP Lequita Halt for further evaluation of multiple GI problems.  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 Works as an Patent examiner. The history is obtained through the patient and some records from her prior care in [place].  She reports multiple chronic gastrointestinal problems including irritable bowel syndrome, celiac disease, and ulcerative colitis.  She was hospitalized in [place], [place] for bowel obstruction in 2005 that did not  require surgical intervention. Colonoscopy after the "clean out" for obstruction. Colonoscopy in that hospitalization showed 23 polyps although she doesn't think any of them were adenomas.  She was diagnosed with Crohn's at that time. Was not offered any medical therapy.  She has not seen a gastroenterologist since 2015 due to insurance limitations. She feels that Crohn's is now called ulcerative colitis. Hospitalized in 2016 for probable partial small bowel obstruction that resolved within 24 hours. CT scan showed mildly distended fluid and fecal filled loop in the left mid abdomen and small bowel suspicious for small bowel obstruction.  She reports frequent nausea, chronic lower abdominal and right-sided abdominal pain, worsened by constipation. Will have a bowel movement every 5 days. Significant straining and manual assistance with defecation. Constipation is followed by diarrhea. Was using Miralax until she started taking mesalamine, methotrexate, and folic acid. It hasn't been as much of a problem since then.   PCP in [facility] 04/2019 started her on mesalamine, weekly methotrexate, and folic acid. Although she's unable to tell me if her symptoms improved on treatment, when she missed a dose of methotrexate she felt "terrible."  [place] remember the testing that was performed at that time. Has been following a gluten free diet since that time identified as no flour,biscuits, or rice.  No prior abdominal imaging.   No known family history of colon cancer or polyps. No family history of uterine/endometrial cancer, pancreatic cancer or gastric/stomach cancer.   Past Medical History:  Diagnosis Date  . Asthma   . Bowel obstruction (HCC)   . Celiac disease   . Colon polyps   . COPD (chronic obstructive pulmonary disease) (HCC)   . Crohn's colitis (HCC)   .  Gallstones   . IBS (irritable bowel syndrome)   . Ovarian cancer (HCC) 1990  . Pericardial  effusion 07/09/2015  . Pleuritic chest pain 07/09/2015    Past Surgical History:  Procedure Laterality Date  . ABDOMINAL HYSTERECTOMY     1989  . BUNIONECTOMY WITH HAMMERTOE RECONSTRUCTION Right   . CHOLECYSTECTOMY    . TONSILLECTOMY      Current Outpatient Medications  Medication Sig Dispense Refill  . albuterol (PROVENTIL) (2.5 MG/3ML) 0.083% nebulizer solution Take 3 mLs (2.5 mg total) by nebulization every 4 (four) hours as needed for wheezing or shortness of breath. 75 mL 6  . Fluticasone-Umeclidin-Vilant (TRELEGY ELLIPTA) 100-62.5-25 MCG/INH AEPB Inhale 1 puff into the lungs daily. 28 each 12  . folic acid (FOLVITE) 1 MG tablet Take 1 tablet (1 mg total) by mouth daily. 30 tablet 0  . mesalamine (LIALDA) 1.2 g EC tablet Take 1 tablet (1.2 g total) by mouth at bedtime. 30 tablet 0  . methotrexate 2.5 MG tablet Take 6 tablets (15 mg total) by mouth once a week for 28 days. 24 tablet 0  . zolpidem (AMBIEN) 10 MG tablet Take 1 tablet (10 mg total) by mouth at bedtime. 30 tablet 5   Current Facility-Administered Medications  Medication Dose Route Frequency Provider Last Rate Last Admin  . betamethasone acetate-betamethasone sodium phosphate (CELESTONE) injection 3 mg  3 mg Intramuscular Once Gala LewandowskyEvans, Brent M, DPM      . betamethasone acetate-betamethasone sodium phosphate (CELESTONE) injection 3 mg  3 mg Intramuscular Once Felecia ShellingEvans, Brent M, DPM        Allergies as of 03/19/2020 - Review Complete 03/13/2020  Allergen Reaction Noted  . Demerol [meperidine] Nausea Only 12/16/2016    Family History  Problem Relation Age of Onset  . Alpha-1 antitrypsin deficiency Mother   . Alpha-1 antitrypsin deficiency Daughter     Social History   Socioeconomic History  . Marital status: Married    Spouse name: Not on file  . Number of children: 3  . Years of education: 1514  . Highest education level: Not on file  Occupational History  . Not on file  Tobacco Use  . Smoking status: Former  Smoker    Packs/day: 0.25    Years: 2.00    Pack years: 0.50    Types: Cigarettes    Quit date: 03/17/1979    Years since quitting: 41.0  . Smokeless tobacco: Never Used  Vaping Use  . Vaping Use: Never used  Substance and Sexual Activity  . Alcohol use: No    Alcohol/week: 0.0 standard drinks  . Drug use: No  . Sexual activity: Not on file  Other Topics Concern  . Not on file  Social History Narrative   Lives with husband in a 2 story home.  Has 3 children.  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  Education: 2 years of college.   Social Determinants of Health   Financial Resource Strain: Not on file  Food Insecurity: Not on file  Transportation Needs: Not on file  Physical Activity: Not on file  Stress: Not on file  Social Connections: Not on file  Intimate Partner Violence: Not on file    Review of Systems: 12 system ROS is negative except as noted above.   Physical Exam: General:   Alert,  well-nourished, pleasant and cooperative in NAD Head:  Normocephalic and atraumatic. Eyes:  Sclera clear, no icterus.   Conjunctiva pink. Ears:  Normal auditory acuity. Nose:  No deformity, discharge,  or lesions. Mouth:  No deformity or lesions.   Neck:  Supple; no masses or thyromegaly. Lungs:  Clear throughout to auscultation.   No wheezes. Heart:  Regular rate and rhythm; no murmurs. Abdomen:  Soft,nontender, nondistended, normal bowel sounds, no rebound or guarding. No hepatosplenomegaly.   Rectal:  Deferred  Msk:  Symmetrical. No boney deformities LAD: No inguinal or umbilical LAD Extremities:  No clubbing or edema. Neurologic:  Alert and  oriented x4;  grossly nonfocal Skin:  Intact without significant lesions or rashes except for vitiligo. Psych:  Alert and cooperative. Normal mood and affect.    Tykia L. Tarri Glenn, MD, MPH 03/19/2020, 2:26 PM

## 2020-03-20 LAB — TOXASSURE SELECT 13 (MW), URINE

## 2020-03-21 ENCOUNTER — Telehealth: Payer: Self-pay | Admitting: Gastroenterology

## 2020-03-21 NOTE — Telephone Encounter (Signed)
Patient called regarding her CT appointment. She said she lives in [place] and they are expecting bad weather and she is concerned with having to pick up the cocktail and is wondering if she can get another contrast with IV

## 2020-03-21 NOTE — Telephone Encounter (Signed)
The pt has been advised that she needs the contrast for the CT so that everything that needs to be seen will be.  She has also been advised that she can pick up her contrast today ahead of any potential weather.  The pt has been advised of the information and verbalized understanding.   She will have her husband pick it up today.

## 2020-03-25 ENCOUNTER — Ambulatory Visit (HOSPITAL_COMMUNITY): Payer: 59

## 2020-03-27 ENCOUNTER — Telehealth: Payer: Self-pay

## 2020-03-27 NOTE — Telephone Encounter (Signed)
FAXED [place]: [facility]  Document: ROI  ROI has been faxed successfully to [facility]. Document and fax confirmation has been placed in the faxed file for future reference.

## 2020-03-28 ENCOUNTER — Other Ambulatory Visit: Payer: Self-pay | Admitting: Family Medicine

## 2020-03-28 DIAGNOSIS — K5190 Ulcerative colitis, unspecified, without complications: Secondary | ICD-10-CM

## 2020-03-28 DIAGNOSIS — G4700 Insomnia, unspecified: Secondary | ICD-10-CM

## 2020-03-28 NOTE — Telephone Encounter (Signed)
  Prescription Request  03/28/2020  What is the name of the medication or equipment? methotrexate 2.5 MG tablet    Have you contacted your pharmacy to request a refill? (if applicable) yes  Which pharmacy would you like this sent to? [facility]    Patient notified that their request is being sent to the clinical staff for review and that they should receive a response within 2 business days.

## 2020-03-28 NOTE — Telephone Encounter (Signed)
RECEIVED DOCUMENTS  Provider and/or Facility: [facility]  Document: Lap Chole with path DOS 09/01/13. COLONOSCOPY AND PATHOLOGY REQUEST NOT RECEIVED  ROI has been faxed successfully to [facility]. Document(s) and fax confirmation(s) have been placed in the faxed file for future reference.

## 2020-03-29 ENCOUNTER — Other Ambulatory Visit: Payer: Self-pay | Admitting: Family Medicine

## 2020-03-29 DIAGNOSIS — K5190 Ulcerative colitis, unspecified, without complications: Secondary | ICD-10-CM

## 2020-03-29 NOTE — Telephone Encounter (Signed)
Pt said that the GI provider did not take over prescribing the Methotrexate, she was upset on the phone, she is also still waiting on her CT which she is having a hard time getting pre-authorized is suppose to have it done on Tuesday but does not have an answer from the insurance on it and does not have any medicine

## 2020-03-29 NOTE — Telephone Encounter (Signed)
Told pt what note said and she said she need meds by tonight. Please call

## 2020-04-01 NOTE — Telephone Encounter (Signed)
Pt got refill, made televisit to discuss further refills since her procedures authorizations have not been approved and they have been rescheduled 3 times now.

## 2020-04-02 ENCOUNTER — Ambulatory Visit (HOSPITAL_COMMUNITY): Payer: 59

## 2020-04-04 ENCOUNTER — Ambulatory Visit (INDEPENDENT_AMBULATORY_CARE_PROVIDER_SITE_OTHER): Payer: 59 | Admitting: Family Medicine

## 2020-04-04 DIAGNOSIS — K5190 Ulcerative colitis, unspecified, without complications: Secondary | ICD-10-CM

## 2020-04-04 MED ORDER — FOLIC ACID 1 MG PO TABS: 1.0000 mg | ORAL_TABLET | Freq: Every day | ORAL | 0 refills | Status: DC

## 2020-04-04 MED ORDER — MESALAMINE 1.2 G PO TBEC: 1.2000 g | DELAYED_RELEASE_TABLET | Freq: Every day | ORAL | 0 refills | Status: DC

## 2020-04-04 NOTE — Progress Notes (Signed)
Virtual Visit via telephone Note Due to COVID-19 pandemic this visit was conducted virtually. This visit type was conducted due to national recommendations for restrictions regarding the COVID-19 Pandemic (e.g. social distancing, sheltering in place) in an effort to limit this patient's exposure and mitigate transmission in our community. All issues noted in this document were discussed and addressed.  A physical exam was not performed with this format.  I connected with Carla Bell on 04/04/20 at 1356 by telephone and verified that I am speaking with the correct person using two identifiers. Carla Bell is currently located in her car and no one is currently with her during the visit. The provider, Gwenlyn Perking, FNP is located in their office at time of visit.  I discussed the limitations, risks, security and privacy concerns of performing an evaluation and management service by telephone and the availability of in person appointments. I also discussed with the patient that there may be a patient responsible charge related to this service. The patient expressed understanding and agreed to proceed.  CC: medication refill  History and Present Illness:  HPI  Carla Bell was started on methotrexate and mesalamine by her previous PCP for ulcerative colitis. She has been continued on the regimen while a referral to GI has been placed for further management of ulcerative colitis. She has had a consultation with GI and a CT, endoscopy, and colonoscopy has been ordered by GI. The GI want to see imaging results to determine the best plan of management for her symptoms. Unfortunately her insurance has denied the PA for imaging 3 times. She would really prefer not to take methotrexate however she feels terrible if she misses one of her weekly doses. She denies new symptoms since her last visit.    ROS As per HPI.   Observations/Objective: Alert and oriented x 3. Able to speak in full  sentences without difficulty.   Assessment and Plan: Carla Bell was seen today for medication refill.  Diagnoses and all orders for this visit:  Ulcerative colitis without complications, unspecified location ([place]) Refills provided for 1 month until patient is able to sort out difficulty with insurance so that GI can assume treatment. One month supply of methotrexate was filled on 04/01/20. Patient agreed to speak with GI regarding difficulty getting ordered imaging approved and to schedule another office visit with GI if she is unable to get imaging approved to discuss treatment options for her symptoms. Lab work was completed 3 weeks ago. Follow up for new or worsening symptoms.  -     mesalamine (LIALDA) 1.2 g EC tablet; Take 1 tablet (1.2 g total) by mouth at bedtime. -     folic acid (FOLVITE) 1 MG tablet; Take 1 tablet (1 mg total) by mouth daily.   Follow Up Instructions: Follow up in 4 months for chronic illnesses.     I discussed the assessment and treatment plan with the patient. The patient was provided an opportunity to ask questions and all were answered. The patient agreed with the plan and demonstrated an understanding of the instructions.    The patient was advised to call back or seek an in-person evaluation if the symptoms worsen or if the condition fails to improve as anticipated.  The above assessment and management plan was discussed with the patient. The patient verbalized understanding of and has agreed to the management plan. Patient is aware to call the clinic if symptoms persist or worsen. Patient is aware when to return to the clinic  for a follow-up visit. Patient educated on when it is appropriate to go to the emergency department.   Time call ended:  [place], FNP

## 2020-04-08 ENCOUNTER — Telehealth: Payer: Self-pay

## 2020-04-08 NOTE — Telephone Encounter (Signed)
2 attempts have been made to fax signed ROI to Dr. Sherrie Sport (confirmations received) and have followed up with the office to obtain last office note. Unfortunately, to date, no records have been received despite our efforts.

## 2020-04-09 ENCOUNTER — Telehealth: Payer: Self-pay | Admitting: Gastroenterology

## 2020-04-09 NOTE — Telephone Encounter (Signed)
Patient calling to reschedule CT

## 2020-04-11 NOTE — Telephone Encounter (Signed)
Pt given the phone number [phone] to reschedule her CT appt.

## 2020-04-24 ENCOUNTER — Encounter: Payer: Self-pay | Admitting: Certified Registered Nurse Anesthetist

## 2020-04-25 ENCOUNTER — Encounter: Payer: Self-pay | Admitting: Gastroenterology

## 2020-04-25 ENCOUNTER — Other Ambulatory Visit: Payer: Self-pay

## 2020-04-25 ENCOUNTER — Ambulatory Visit (AMBULATORY_SURGERY_CENTER): Payer: 59 | Admitting: Gastroenterology

## 2020-04-25 VITALS — BP 136/64 | HR 69 | Temp 98.2°F | Resp 15 | Ht 67.0 in | Wt 183.0 lb

## 2020-04-25 DIAGNOSIS — K50918 Crohn's disease, unspecified, with other complication: Secondary | ICD-10-CM

## 2020-04-25 DIAGNOSIS — D123 Benign neoplasm of transverse colon: Secondary | ICD-10-CM

## 2020-04-25 DIAGNOSIS — K2900 Acute gastritis without bleeding: Secondary | ICD-10-CM

## 2020-04-25 DIAGNOSIS — D125 Benign neoplasm of sigmoid colon: Secondary | ICD-10-CM | POA: Diagnosis not present

## 2020-04-25 DIAGNOSIS — R109 Unspecified abdominal pain: Secondary | ICD-10-CM

## 2020-04-25 DIAGNOSIS — D128 Benign neoplasm of rectum: Secondary | ICD-10-CM | POA: Diagnosis not present

## 2020-04-25 DIAGNOSIS — K51919 Ulcerative colitis, unspecified with unspecified complications: Secondary | ICD-10-CM

## 2020-04-25 DIAGNOSIS — K5730 Diverticulosis of large intestine without perforation or abscess without bleeding: Secondary | ICD-10-CM

## 2020-04-25 DIAGNOSIS — K2951 Unspecified chronic gastritis with bleeding: Secondary | ICD-10-CM | POA: Diagnosis not present

## 2020-04-25 DIAGNOSIS — R110 Nausea: Secondary | ICD-10-CM

## 2020-04-25 HISTORY — PX: COLONOSCOPY: SHX174

## 2020-04-25 MED ORDER — SODIUM CHLORIDE 0.9 % IV SOLN: 500.0000 mL | Freq: Once | INTRAVENOUS | Status: DC

## 2020-04-25 NOTE — Op Note (Signed)
[facility] Patient Name: Carla Bell Procedure Date: 04/25/2020 2:13 PM MRN: [id number] Endoscopist: Thornton Park MD, MD Age: 60 Referring MD:  Date of Birth: 10/03/1960 Gender: Female Account #: [id number] Procedure:                Upper GI endoscopy Indications:              Abdominal pain Medicines:                Monitored Anesthesia Care Procedure:                Pre-Anesthesia Assessment:                           - Prior to the procedure, a History and Physical                            was performed, and patient medications and                            allergies were reviewed. The patient's tolerance of                            previous anesthesia was also reviewed. The risks                            and benefits of the procedure and the sedation                            options and risks were discussed with the patient.                            All questions were answered, and informed consent                            was obtained. Prior Anticoagulants: The patient has                            taken no previous anticoagulant or antiplatelet                            agents. ASA Grade Assessment: III - A patient with                            severe systemic disease. After reviewing the risks                            and benefits, the patient was deemed in                            satisfactory condition to undergo the procedure.                           After obtaining informed consent, the endoscope was  passed under direct vision. Throughout the                            procedure, the patient's blood pressure, pulse, and                            oxygen saturations were monitored continuously. The                            Endoscope was introduced through the mouth, and                            advanced to the third part of duodenum. The upper                            GI endoscopy was accomplished  without difficulty.                            The patient tolerated the procedure well. Findings:                 The esophagus was normal.                           Hill Grade III hiatal hernia. Diffuse mildly                            erythematous mucosa without bleeding was found in                            the gastric body. Biopsies were taken from the                            antrum, body, and fundus with a cold forceps for                            histology. Estimated blood loss was minimal.                           The examined duodenum was normal. Biopsies were                            taken with a cold forceps for histology. Estimated                            blood loss was minimal.                           The cardia and gastric fundus were normal on                            retroflexion.                           The exam was otherwise without abnormality. Complications:  No immediate complications. Estimated blood loss:                            Minimal. Estimated Blood Loss:     Estimated blood loss was minimal. Impression:               - Normal esophagus.                           - Erythematous mucosa in the gastric body. Biopsied.                           - Normal examined duodenum. Biopsied.                           - The examination was otherwise normal. Recommendation:           - Patient has a contact number available for                            emergencies. The signs and symptoms of potential                            delayed complications were discussed with the                            patient. Return to normal activities tomorrow.                            Written discharge instructions were provided to the                            patient.                           - Resume previous diet.                           - Continue present medications.                           - Await pathology results.                            - No aspirin, ibuprofen, naproxen, or other                            non-steroidal anti-inflammatory drugs.                           - Proceed with colonoscopy today as previously                            planned. Thornton Park MD, MD 04/25/2020 2:54:09 PM This report has been signed electronically.

## 2020-04-25 NOTE — Patient Instructions (Signed)
No Aspirin, ibuprofen, naproxen or other NSAIDs.  You may take Tylenol if needed for pain.  Take Miralax 17g daily for constipation.  Drink plenty of water with this as well.  Handouts given for Gastritis, Hiatal Hernia, polyps, diverticulosis and hemorrhoids.  YOU HAD AN ENDOSCOPIC PROCEDURE TODAY AT [facility]:   Refer to the procedure report that was given to you for any specific questions about what was found during the examination.  If the procedure report does not answer your questions, please call your gastroenterologist to clarify.  If you requested that your care partner not be given the details of your procedure findings, then the procedure report has been included in a sealed envelope for you to review at your convenience later.  YOU SHOULD EXPECT: Some feelings of bloating in the abdomen. Passage of more gas than usual.  Walking can help get rid of the air that was put into your GI tract during the procedure and reduce the bloating. If you had a lower endoscopy (such as a colonoscopy or flexible sigmoidoscopy) you may notice spotting of blood in your stool or on the toilet paper. If you underwent a bowel prep for your procedure, you may not have a normal bowel movement for a few days.  Please Note:  You might notice some irritation and congestion in your nose or some drainage.  This is from the oxygen used during your procedure.  There is no need for concern and it should clear up in a day or so.  SYMPTOMS TO REPORT IMMEDIATELY:   Following lower endoscopy (colonoscopy or flexible sigmoidoscopy):  Excessive amounts of blood in the stool  Significant tenderness or worsening of abdominal pains  Swelling of the abdomen that is new, acute  Fever of 100F or higher   Following upper endoscopy (EGD)  Vomiting of blood or coffee ground material  New chest pain or pain under the shoulder blades  Painful or persistently difficult swallowing  New shortness of  breath  Fever of 100F or higher  Black, tarry-looking stools  For urgent or emergent issues, a gastroenterologist can be reached at any hour by calling [phone]. Do not use MyChart messaging for urgent concerns.    DIET:  We do recommend a small meal at first, but then you may proceed to your regular diet.  Drink plenty of fluids but you should avoid alcoholic beverages for 24 hours.  ACTIVITY:  You should plan to take it easy for the rest of today and you should NOT DRIVE or use heavy machinery until tomorrow (because of the sedation medicines used during the test).    FOLLOW UP: Our staff will call the number listed on your records 48-72 hours following your procedure to check on you and address any questions or concerns that you may have regarding the information given to you following your procedure. If we do not reach you, we will leave a message.  We will attempt to reach you two times.  During this call, we will ask if you have developed any symptoms of COVID 19. If you develop any symptoms (ie: fever, flu-like symptoms, shortness of breath, cough etc.) before then, please call [phone].  If you test positive for Covid 19 in the 2 weeks post procedure, please call and report this information to [place].    If any biopsies were taken you will be contacted by phone or by letter within the next 1-3 weeks.  Please call us at [phone] if  you have not heard about the biopsies in 3 weeks.    SIGNATURES/CONFIDENTIALITY: You and/or your care partner have signed paperwork which will be entered into your electronic medical record.  These signatures attest to the fact that that the information above on your After Visit Summary has been reviewed and is understood.  Full responsibility of the confidentiality of this discharge information lies with you and/or your care-partner.

## 2020-04-25 NOTE — Progress Notes (Signed)
Report given to PACU, vss 

## 2020-04-25 NOTE — Progress Notes (Signed)
Medical history reviewed. VS assessed by Eugenie Norrie, RN

## 2020-04-25 NOTE — Op Note (Signed)
[facility] Patient Name: Viana Sleep Procedure Date: 04/25/2020 2:12 PM MRN: [id number] Endoscopist: Thornton Park MD, MD Age: 60 Referring MD:  Date of Birth: [birth year] Gender: Female Account #: [id number] Procedure:                Colonoscopy Indications:              Abdominal pain                           Prior GI diagnoses: celiac, ulcerative colitis,                            Crohn's, bowel obstruction, 23 colon polyps                           Chronic constipation with suspected pelvic floor                            dysnnergiaAbdominal pain                           Prior Medicines:                Monitored Anesthesia Care Procedure:                Pre-Anesthesia Assessment:                           - Prior to the procedure, a History and Physical                            was performed, and patient medications and                            allergies were reviewed. The patient's tolerance of                            previous anesthesia was also reviewed. The risks                            and benefits of the procedure and the sedation                            options and risks were discussed with the patient.                            All questions were answered, and informed consent                            was obtained. Prior Anticoagulants: The patient has                            taken no previous anticoagulant or antiplatelet                            agents. ASA Grade Assessment: III - A patient with  severe systemic disease. After reviewing the risks                            and benefits, the patient was deemed in                            satisfactory condition to undergo the procedure.                           After obtaining informed consent, the colonoscope                            was passed under direct vision. Throughout the                            procedure, the patient's blood  pressure, pulse, and                            oxygen saturations were monitored continuously. The                            Colonoscope was introduced through the anus and                            advanced to the 5 cm into the ileum. A second                            forward view of the right colon was performed. The                            colonoscopy was performed without difficulty. The                            patient tolerated the procedure well. The quality                            of the bowel preparation was good. The terminal                            ileum, ileocecal valve, appendiceal orifice, and                            rectum were photographed. Scope In: 2:32:48 PM Scope Out: 2:50:01 PM Scope Withdrawal Time: 0 hours 14 minutes 8 seconds  Total Procedure Duration: 0 hours 17 minutes 13 seconds  Findings:                 The perianal and digital rectal examinations were                            normal.                           A few small and large-mouthed diverticula were  found in the entire colon.                           Three sessile polyps were found in the rectum,                            sigmoid colon and hepatic flexure. The polyps were                            1 to 5 mm in size. These polyps were removed with a                            cold snare. Resection and retrieval were complete.                            Estimated blood loss was minimal.                           The colon (entire examined portion) otherwise                            appeared normal. Biopsies were taken from the right                            colon and left colon with a cold forceps for                            histology. Estimated blood loss was minimal.                           Non-bleeding internal hemorrhoids were found.                           The exam was otherwise without abnormality on                            direct and  retroflexion views. Complications:            No immediate complications. Estimated blood loss:                            Minimal. Estimated Blood Loss:     Estimated blood loss was minimal. Impression:               - Diverticulosis in the entire examined colon.                           - Three 1 to 5 mm polyps in the rectum, in the                            sigmoid colon and at the hepatic flexure, removed                            with a cold snare. Resected and retrieved.                           -  The entire examined colon is normal. Biopsied.                           - Non-bleeding internal hemorrhoids.                           - The examination was otherwise normal on direct                            and retroflexion views. Recommendation:           - Patient has a contact number available for                            emergencies. The signs and symptoms of potential                            delayed complications were discussed with the                            patient. Return to normal activities tomorrow.                            Written discharge instructions were provided to the                            patient.                           - Resume previous diet.                           - Continue present medications.                           - Start using Miralax 17g daily for constipation.                           - Await pathology results.                           - Repeat colonoscopy date to be determined after                            pending pathology results are reviewed for                            surveillance.                           - Emerging evidence supports eating a diet of                            fruits, vegetables, grains, calcium, and yogurt                            while reducing red meat and  alcohol may reduce the                            risk of colon cancer.                           - Thank you for allowing me to be  involved in your                            colon cancer prevention. Thornton Park MD, MD 04/25/2020 3:00:46 PM This report has been signed electronically.

## 2020-04-25 NOTE — Progress Notes (Signed)
Called to room to assist during endoscopic procedure.  Patient ID and intended procedure confirmed with present staff. Received instructions for my participation in the procedure from the performing physician.  

## 2020-04-29 ENCOUNTER — Telehealth: Payer: Self-pay

## 2020-04-29 NOTE — Telephone Encounter (Signed)
Thank you. I agree. She should call to schedule a follow-up visit. Thanks.

## 2020-04-29 NOTE — Telephone Encounter (Signed)
  Follow up Call-  Call back number 04/25/2020  Post procedure Call Back phone  # [phone]  Permission to leave phone message Yes  Some recent data might be hidden     Patient questions:  Do you have a fever, pain , or abdominal swelling? No. Pain Score  0 *  Have you tolerated food without any problems? Yes.    Have you been able to return to your normal activities? Yes.    Do you have any questions about your discharge instructions: Diet   No. Medications  No. Follow up visit  No.  Do you have questions or concerns about your Care? No.  Actions: * If pain score is 4 or above: No action needed, pain <4.  1. Have you developed a fever since your procedure? no  2.   Have you had an respiratory symptoms (SOB or cough) since your procedure? no  3.   Have you tested positive for COVID 19 since your procedure no  4.   Have you had any family members/close contacts diagnosed with the COVID 19 since your procedure?  no   If yes to any of these questions please route to Joylene John, RN and Joella Prince, RN

## 2020-05-06 ENCOUNTER — Encounter: Payer: Self-pay | Admitting: Gastroenterology

## 2020-05-10 ENCOUNTER — Ambulatory Visit (HOSPITAL_COMMUNITY)
Admission: RE | Admit: 2020-05-10 | Discharge: 2020-05-10 | Disposition: A | Discharge status: Home / Self Care | Payer: 59 | Source: Ambulatory Visit | Attending: Gastroenterology | Admitting: Gastroenterology

## 2020-05-10 ENCOUNTER — Other Ambulatory Visit: Payer: Self-pay

## 2020-05-10 DIAGNOSIS — R110 Nausea: Secondary | ICD-10-CM | POA: Insufficient documentation

## 2020-05-10 DIAGNOSIS — R109 Unspecified abdominal pain: Secondary | ICD-10-CM | POA: Insufficient documentation

## 2020-05-10 LAB — POCT I-STAT CREATININE: Creatinine, Ser: 0.8 mg/dL (ref 0.44–1.00)

## 2020-05-10 MED ORDER — IOHEXOL 300 MG/ML  SOLN
100.0000 mL | Freq: Once | INTRAMUSCULAR | Status: AC | PRN
  Administered 2020-05-10: 100 mL via INTRAVENOUS

## 2020-05-28 ENCOUNTER — Encounter: Payer: Self-pay | Admitting: Family Medicine

## 2020-05-28 ENCOUNTER — Ambulatory Visit (INDEPENDENT_AMBULATORY_CARE_PROVIDER_SITE_OTHER): Payer: 59 | Admitting: Family Medicine

## 2020-05-28 ENCOUNTER — Ambulatory Visit (INDEPENDENT_AMBULATORY_CARE_PROVIDER_SITE_OTHER): Payer: 59

## 2020-05-28 ENCOUNTER — Other Ambulatory Visit: Payer: Self-pay

## 2020-05-28 VITALS — BP 125/65 | HR 76 | Temp 98.0°F | Ht 67.0 in | Wt 185.5 lb

## 2020-05-28 DIAGNOSIS — M542 Cervicalgia: Secondary | ICD-10-CM

## 2020-05-28 DIAGNOSIS — M79652 Pain in left thigh: Secondary | ICD-10-CM | POA: Diagnosis not present

## 2020-05-28 DIAGNOSIS — W19XXXA Unspecified fall, initial encounter: Secondary | ICD-10-CM

## 2020-05-28 NOTE — Progress Notes (Signed)
Acute Office Visit  Subjective:    Patient ID: Carla Bell, female    DOB: 21-Nov-1960, 60 y.o.   MRN: [id number]  Chief Complaint  Patient presents with  . Neck Pain  . Leg Pain    HPI Patient is in today for neck pain and left upper leg pain  1. Neck pain Carla Bell reports a fall about 2 months ago where she fell forward while walking her dog. She felt her neck pop when this happen. She denies LOC or head injury. She reports neck pain that has been gradually worsening since then. She reports that it feels stiff at time if her head is in the same position after a period time. The pain dull and constant that is worse with certain movements. The pain is a 3/10. The pain is also in the muscles in her upper back. She reports crepitus. She has tried [place] gel and tigerpaw, and heating pad with some improvement.   2. Left upper leg pain This started after the fall as well. The pain is on her anterior thigh. The pain is only when she lifts her leg. Sometimes she feels like she has to lift this leg with her hands. The pain is a mild unless she is lifting her leg. It is really bad when going up stairs.  She denies numbness or tingling. Denies changes in bowel or bladder control.   Past Medical History:  Diagnosis Date  . Alpha-1-antitrypsin deficiency ([place])   . Asthma   . Bowel obstruction ([place])   . Celiac disease   . Colon polyps   . COPD (chronic obstructive pulmonary disease) ([place])   . Crohn's colitis ([place])   . Gallstones   . IBS (irritable bowel syndrome)   . Ovarian cancer ([place]) 1990  . Pericardial effusion 07/09/2015  . Pleuritic chest pain 07/09/2015    Past Surgical History:  Procedure Laterality Date  . ABDOMINAL HYSTERECTOMY     1989  . BUNIONECTOMY WITH HAMMERTOE RECONSTRUCTION Right   . CHOLECYSTECTOMY    . COLONOSCOPY  04/25/2020   [place],   . TONSILLECTOMY      Family History  Problem Relation Age of Onset  . Alpha-1 antitrypsin deficiency  Mother   . Alpha-1 antitrypsin deficiency Daughter   . Colon cancer Neg Hx   . Rectal cancer Neg Hx   . Stomach cancer Neg Hx   . Prostate cancer Neg Hx     Social History   Socioeconomic History  . Marital status: Married    Spouse name: Not on file  . Number of children: 3  . Years of education: 1  . Highest education level: Not on file  Occupational History  . Occupation: Dietitian  Tobacco Use  . Smoking status: Former Smoker    Packs/day: 0.25    Years: 2.00    Pack years: 0.50    Types: Cigarettes    Quit date: 03/17/1979    Years since quitting: 41.2  . Smokeless tobacco: Never Used  Vaping Use  . Vaping Use: Never used  Substance and Sexual Activity  . Alcohol use: No    Alcohol/week: 0.0 standard drinks  . Drug use: No  . Sexual activity: Not on file  Other Topics Concern  . Not on file  Social History Narrative   Lives with husband in a 2 story home.  Has 3 children.  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  Education: 2 years of college.  Social Determinants of Health   Financial Resource Strain: Not on file  Food Insecurity: Not on file  Transportation Needs: Not on file  Physical Activity: Not on file  Stress: Not on file  Social Connections: Not on file  Intimate Partner Violence: Not on file    Outpatient Medications Prior to Visit  Medication Sig Dispense Refill  . albuterol (PROVENTIL) (2.5 MG/3ML) 0.083% nebulizer solution Take 3 mLs (2.5 mg total) by nebulization every 4 (four) hours as needed for wheezing or shortness of breath. 75 mL 6  . Fluticasone-Umeclidin-Vilant (TRELEGY ELLIPTA) 100-62.5-25 MCG/INH AEPB Inhale 1 puff into the lungs daily. 28 each 12  . zolpidem (AMBIEN) 10 MG tablet Take 1 tablet (10 mg total) by mouth at bedtime. 30 tablet 5  . folic acid (FOLVITE) 1 MG tablet Take 1 tablet (1 mg total) by mouth daily. 30 tablet 0  . mesalamine (LIALDA) 1.2 g EC tablet Take 1 tablet (1.2 g total) by mouth at  bedtime. 30 tablet 0  . methotrexate 2.5 MG tablet TAKE 6 TABLETS BY MOUTH ONCE A WEEK FOR 28 DAYS 24 tablet 0  . betamethasone acetate-betamethasone sodium phosphate (CELESTONE) injection 3 mg     . betamethasone acetate-betamethasone sodium phosphate (CELESTONE) injection 3 mg      No facility-administered medications prior to visit.    Allergies  Allergen Reactions  . Demerol [Meperidine] Nausea Only    Review of Systems As per HPI.    Objective:    Physical Exam Vitals and nursing note reviewed.  Constitutional:      Appearance: Normal appearance.  HENT:     Head: Normocephalic and atraumatic.  Pulmonary:     Effort: Pulmonary effort is normal. No respiratory distress.  Musculoskeletal:     Cervical back: Tenderness and crepitus present. No swelling, edema, erythema, signs of trauma, rigidity or bony tenderness. Pain with movement present. Normal range of motion.     Thoracic back: Normal.     Left upper leg: Tenderness present. No swelling or edema.     Right lower leg: No edema.     Left lower leg: No edema.       Legs:  Skin:    General: Skin is warm and dry.  Neurological:     General: No focal deficit present.     Mental Status: She is alert and oriented to person, place, and time.     Motor: No weakness.     Gait: Gait normal.  Psychiatric:        Mood and Affect: Mood normal.        Behavior: Behavior normal.        Thought Content: Thought content normal.        Judgment: Judgment normal.     BP 125/65   Pulse 76   Temp 98 F (36.7 C) (Temporal)   Ht 5\' 7"  (1.702 m)   Wt 185 lb 8 oz (84.1 kg)   BMI 29.05 kg/m  Wt Readings from Last 3 Encounters:  05/28/20 185 lb 8 oz (84.1 kg)  04/25/20 183 lb (83 kg)  03/19/20 183 lb 8 oz (83.2 kg)    Health Maintenance Due  Topic Date Due  . COVID-19 Vaccine (3 - Booster for Moderna series) 12/14/2019    There are no preventive care reminders to display for this patient.   Lab Results  Component  Value Date   TSH 2.290 03/13/2020   Lab Results  Component Value Date   WBC 7.1  03/13/2020   HGB 12.0 03/13/2020   HCT 36.1 03/13/2020   MCV 94 03/13/2020   PLT 263 03/13/2020   Lab Results  Component Value Date   NA 140 03/13/2020   K 3.9 03/13/2020   CO2 27 03/13/2020   GLUCOSE 78 03/13/2020   BUN 13 03/13/2020   CREATININE 0.80 05/10/2020   BILITOT 0.2 03/13/2020   ALKPHOS 93 03/13/2020   AST 19 03/13/2020   ALT 21 03/13/2020   PROT 6.1 03/13/2020   ALBUMIN 4.4 03/13/2020   CALCIUM 9.1 03/13/2020   ANIONGAP 9 06/12/2015   Lab Results  Component Value Date   CHOL 178 03/13/2020   Lab Results  Component Value Date   HDL 60 03/13/2020   Lab Results  Component Value Date   LDLCALC 102 (H) 03/13/2020   Lab Results  Component Value Date   TRIG 87 03/13/2020   Lab Results  Component Value Date   CHOLHDL 3.0 03/13/2020   Lab Results  Component Value Date   HGBA1C 5.3 04/14/2016       Assessment & Plan:   Yarden was seen today for neck pain and leg pain.  Diagnoses and all orders for this visit:  Acute neck pain Xray today in office, radiology report pending. Continue tiger balm, heat. Tylenol as needed for pain. Will notify patient of results and develop POC from there.  -     DG Cervical Spine Complete; Future  Pain of left thigh Xray today in office, radiology report pending. Tylenol as needed for pain. Continue tiger balm, heat.  -     DG FEMUR MIN 2 VIEWS LEFT; Future  Fall, initial encounter -     DG Cervical Spine Complete; Future -     DG FEMUR MIN 2 VIEWS LEFT; Future  The patient indicates understanding of these issues and agrees with the plan.  Gwenlyn Perking, FNP

## 2020-05-28 NOTE — Patient Instructions (Signed)
Cervical Sprain A cervical sprain is a stretch or tear in one or more of the ligaments in the neck. Ligaments are the tissues that connect bones. Cervical sprains can range from mild to severe. Severe cervical sprains can cause the spinal bones (vertebrae) in the neck to be unstable. This can result in spinal cord damage and in serious nervous system problems. The time that it takes for a cervical sprain to heal depends on the cause and extent of the injury. Most cervical sprains heal in 4-6 weeks. What are the causes? Cervical sprains may be caused by trauma, such as an injury from a motor vehicle accident, a fall, or a sudden forward and backward whipping movement of the head and neck (whiplash injury). Mild cervical sprains may be caused by wear and tear over time. What increases the risk? The following factors may make you more likely to develop this condition:  Participating in activities that have a high risk of trauma to the neck. These include contact sports, auto racing, gymnastics, and diving.  Taking risks when driving or riding in a motor vehicle.  Osteoarthritis of the spine.  Poor strength and flexibility of the neck.  A previous neck injury.  Poor posture.  Spending long periods in certain positions that put stress on the neck, such as sitting at a computer for a long time. What are the signs or symptoms? Symptoms of this condition include:  Pain, soreness, stiffness, tenderness, swelling, or a burning sensation in the front, back, or sides of the neck, shoulders, or upper back.  Sudden tightening of neck muscles (spasms).  Limited ability to move the neck.  Headache.  Dizziness.  Nausea or vomiting.  Weakness, numbness, or tingling in a hand or an arm. Symptoms may develop right away after injury, or they may develop over a few days. In some cases, symptoms may go away with treatment and return (recur) over time. How is this diagnosed? This condition may be  diagnosed based on:  Your medical history.  Your symptoms.  Any recent injuries or known neck problems that you have, such as arthritis in the neck.  A physical exam.  Imaging tests, such as X-rays, MRI, and CT scan. How is this treated? This condition is treated by resting and icing the injured area and doing physical therapy exercises. Heat therapy may be used 2-3 days after the injury occurred if there is no swelling. Depending on the severity of your condition, treatment may also include:  Keeping your neck in place (immobilized) for periods of time. This may be done using: ? A cervical collar. This supports your chin and the back of your head. ? A cervical traction device. This is a sling that holds up your head. The device removes weight and pressure from your neck, and it may help to relieve pain.  Medicines that help to relieve pain and inflammation.  Medicines that help to relax your muscles (muscle relaxants).  Surgery. This is rare. Follow these instructions at home: Medicines  Take over-the-counter and prescription medicines only as told by your health care provider.  Ask your health care provider if the medicine prescribed to you: ? Requires you to avoid driving or using heavy machinery. ? Can cause constipation. You may need to take these actions to prevent or treat constipation:  Drink enough fluid to keep your urine pale yellow.  Take over-the-counter or prescription medicines.  Eat foods that are high in fiber, such as beans, whole grains, and fresh fruits   and vegetables.  Limit foods that are high in fat and processed sugars, such as fried or sweet foods.   If you have a cervical collar:  Wear the collar as told by your health care provider. Do not remove it unless told.  Ask before making any adjustments to your collar.  If you have long hair, keep it outside of the collar.  Ask your health care provider if you may remove the collar for cleaning and  bathing. If so: ? Follow instructions about how to remove it safely. ? Clean it by hand with mild soap and water and air-dry it completely. ? If your collar has removable pads, remove them every 1-2 days and wash them by hand with soap and water. Let them air-dry completely before putting them back in the collar.  Tell your health care provider if your skin under the collar has irritation or sores. Managing pain, stiffness, and swelling  If directed, use a cervical traction device as told.  If directed, put ice on the affected area. To do this: ? Put ice in a plastic bag. ? Place a towel between your skin and the bag. ? Leave the ice on for 20 minutes, 2-3 times a day.  If directed, apply heat to the affected area before you do your physical therapy or as often as told by your health care provider. Use the heat source that your health care provider recommends, such as a moist heat pack or a heating pad. ? Place a towel between your skin and the heat source. ? Leave the heat on for 20-30 minutes. ? Remove the heat if your skin turns bright red. This is especially important if you are unable to feel pain, heat, or cold. You may have a greater risk of getting burned.      Activity  Do not drive while wearing a cervical collar. If you do not have a cervical collar, ask if it is safe to drive while your neck heals.  Do not lift anything that is heavier than 10 lb (4.5 kg), or the limit that you are told, until your health care provider says that it is safe.  Rest as told by your health care provider.  If physical therapy was prescribed, do exercises as told by your health care provider or physical therapist.  Return to your normal activities as told by your health care provider. Avoid positions and activities that make your symptoms worse. Ask your health care provider what activities are safe for you. General instructions  Do not use any products that contain nicotine or tobacco, such  as cigarettes, e-cigarettes, and chewing tobacco. These can delay healing. If you need help quitting, ask your health care provider.  Keep all follow-up visits as told by your health care provider or physical therapist. This is important. How is this prevented? To prevent a cervical sprain from happening again:  Use and maintain good posture. Make any needed adjustments to your workstation to help you do this.  Exercise regularly as told by your health care provider or physical therapist.  Avoid risky activities that may cause a cervical sprain. Contact a health care provider if you have:  Symptoms that get worse or do not get better after 2 weeks of treatment.  Pain that gets worse or does not get better with medicine.  New, unexplained symptoms.  Sores or irritated skin on your neck from wearing your cervical collar. Get help right away if:  You have severe pain.    You develop numbness, tingling, or weakness in any part of your body.  You cannot move a part of your body (you have paralysis).  You have neck pain along with severe dizziness or headache. Summary  A cervical sprain is a stretch or tear in one or more of the ligaments in the neck.  Cervical sprains may be caused by trauma, such as an injury from a motor vehicle accident, a fall, or a sudden forward and backward whipping movement of the head and neck (whiplash injury).  Symptoms may develop right away after injury, or they may develop over a few days.  This condition may be treated with rest, ice, heat, medicines, physical therapy, and surgery. This information is not intended to replace advice given to you by your health care provider. Make sure you discuss any questions you have with your health care provider. Document Revised: 11/09/2018 Document Reviewed: 11/09/2018 Elsevier Patient Education  2021 Elsevier Inc.  

## 2020-05-29 ENCOUNTER — Other Ambulatory Visit: Payer: Self-pay | Admitting: Family Medicine

## 2020-05-29 DIAGNOSIS — M542 Cervicalgia: Secondary | ICD-10-CM

## 2020-05-29 MED ORDER — CYCLOBENZAPRINE HCL 10 MG PO TABS: 10.0000 mg | ORAL_TABLET | Freq: Three times a day (TID) | ORAL | 1 refills | Status: DC | PRN

## 2020-06-19 ENCOUNTER — Ambulatory Visit: Payer: 59 | Admitting: Gastroenterology

## 2020-08-06 ENCOUNTER — Ambulatory Visit (INDEPENDENT_AMBULATORY_CARE_PROVIDER_SITE_OTHER): Payer: 59 | Admitting: Gastroenterology

## 2020-08-06 ENCOUNTER — Encounter: Payer: Self-pay | Admitting: Gastroenterology

## 2020-08-06 VITALS — BP 120/80 | HR 73 | Ht 67.0 in | Wt 187.0 lb

## 2020-08-06 DIAGNOSIS — K588 Other irritable bowel syndrome: Secondary | ICD-10-CM | POA: Diagnosis not present

## 2020-08-06 DIAGNOSIS — K5190 Ulcerative colitis, unspecified, without complications: Secondary | ICD-10-CM | POA: Diagnosis not present

## 2020-08-06 DIAGNOSIS — M6289 Other specified disorders of muscle: Secondary | ICD-10-CM | POA: Diagnosis not present

## 2020-08-06 MED ORDER — LINACLOTIDE 145 MCG PO CAPS: 145.0000 ug | ORAL_CAPSULE | Freq: Every day | ORAL | 2 refills | Status: DC

## 2020-08-06 MED ORDER — DICYCLOMINE HCL 10 MG PO CAPS: 10.0000 mg | ORAL_CAPSULE | Freq: Three times a day (TID) | ORAL | 2 refills | Status: DC

## 2020-08-06 NOTE — Patient Instructions (Addendum)
It was a pleasure to see you today. Based on our discussion, I am providing you with my recommendations below:  RECOMMENDATION(S):   PRESCRIPTION MEDICATION(S):   We have sent the following medication(s) to your pharmacy:  . Rolan Lipa - please take 147mcg daily . Dicyclomine - please take 10mg  4 times daily  NOTE: If your medication(s) requires a PRIOR AUTHORIZATION, we will receive notification from your pharmacy. Once received, the process to submit for approval may take up to 7-10 business days. You will be contacted about any denials we have received from your insurance company as well as alternatives recommended by your provider.  ANORECTAL MANOMETRY:  You have been scheduled to have an anorectal manometry at [facility] on 08/19/20 at 8:30am. Please arrive 30 minutes prior to your appointment time for registration (1st floor of the hospital-admissions).  Please make certain to use 1 Fleets enema 2 hours prior to coming for your appointment. You can purchase Fleets enemas from the laxative section at your drug store. You should not eat anything during the two hours prior to the procedure. You may take regular medications with small sips of water at least 2 hours prior to the study.  Anorectal manometry is a test performed to evaluate patients with constipation or fecal incontinence. This test measures the pressures of the anal sphincter muscles, the sensation in the rectum, and the neural reflexes that are needed for normal bowel movements.  THE PROCEDURE The test takes approximately 30 minutes to 1 hour. You will be asked to change into a hospital gown. A technician or nurse will explain the procedure to you, take a brief health history, and answer any questions you may have. The patient then lies on his or her left side. A small, flexible tube, about the size of a thermometer, with a balloon at the end is inserted into the rectum. The catheter is connected to a machine that measures  the pressure. During the test, the small balloon attached to the catheter may be inflated in the rectum to assess the normal reflex pathways. The nurse or technician may also ask the person to squeeze, relax, and push at various times. The anal sphincter muscle pressures are measured during each of these maneuvers. To squeeze, the patient tightens the sphincter muscles as if trying to prevent anything from coming out. To push or bear down, the patient strains down as if trying to have a bowel movement.  BMI:  . If you are age 70 or younger, your body mass index should be between 19-25. Your Body mass index is 29.29 kg/m. If this is out of the aformentioned range listed, please consider follow up with your Primary Care Provider.   MY CHART:  The [facility] providers would like to encourage you to use [facility] to communicate with providers for non-urgent requests or questions.  Due to long hold times on the telephone, sending your provider a message by [facility] may be a faster and more efficient way to get a response.  Please allow 48 business hours for a response.  Please remember that this is for non-urgent requests.   FOLLOW UP:  . After your procedure, you will receive a call from my office staff regarding my recommendation for follow up.  Thank you for trusting me with your gastrointestinal care!    [place], MD, MPH

## 2020-08-06 NOTE — Progress Notes (Signed)
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 Distant history of 23 colon polyps    - 3 tubular adenomas removed on colonoscopy 04/25/20    - surveillance recommended in 2025  Recent evaluation including EGD, colonoscopy, and contrasted CT scan makes the diagnoses of IBD and celiac disease less likely. Thankfully, she has stopped, and feels better, off the mesalamine, weekly methotrexate, and folic acid as started by a doctor in [place]. She may have gluten intolerance and I've encouraged her to continue the diet as is makes a marked difference in her pain.   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 She is sensitive to diarrhea, so will proceed with treatment cautiously.   She had 3 tubular adenomas removed on recent colonoscopy. I have recommended a surveillance colonoscopy in 3 years.  PLAN: - Anorectal manometry +/- biofeedback pelvic PT - Trial of Linzess 145 mcg daily - Dicylcomine 10 mg QID - Trial of Xifaxan if no improvement to the above therapies - Consider FODMAP diet in the future - Office follow-up after completing manometry.  Please see the "Patient Instructions" section for addition details about the plan.  HPI: Carla Bell is a 60 y.o. female who returns in follow-up for multiple GI problems.  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   At the time of her initial consultation 03/19/20 she reported multiple chronic gastrointestinal problems including irritable bowel syndrome, celiac disease, and ulcerative colitis.  She was hospitalized in [place], [place] for bowel obstruction in 2005 that did not require surgical intervention. Colonoscopy after the "clean out" for obstruction. Colonoscopy in that hospitalization showed 23 polyps although she doesn't think any of them were adenomas.  She was diagnosed with Crohn's at that time. Was not offered any medical therapy.  She has not seen a gastroenterologist since 2015 due to insurance limitations. She feels that Crohn's is now called ulcerative colitis. Hospitalized in 2016 for probable partial small bowel obstruction that resolved within 24 hours. CT scan showed mildly distended fluid and fecal filled loop in the left mid abdomen and small bowel suspicious for small bowel obstruction.  She reports frequent nausea, chronic lower abdominal and right-sided abdominal pain, worsened by constipation. Will have a bowel movement every 5 days. Significant straining and manual assistance with defecation. Constipation is followed by diarrhea. Was using Miralax until she started taking mesalamine, methotrexate, and folic acid. It hasn't been as much of a problem since then.   PCP in [facility] 04/2019 started her on mesalamine, weekly methotrexate, and folic acid. Although she's unable to tell me if her symptoms improved on treatment, when she missed a dose of methotrexate she felt "terrible."  Diagnosed with celiac in [place], [place] - although she cannot remember the testing that was performed at that time. Has been following a gluten free diet since that time identified as no flour,biscuits, or rice.  In an effort to sort out  her diagnoses, She had an EGD and colonoscopy 05/05/20. EGD showed a Hill grade 3 hiatal hernia and  gastritis.  Gastric biopsy showed mild chronic gastritis.  There was no H. pylori.  Duodenal biopsies were normal.  Colonoscopy performed to the terminal ileum revealed nonbleeding internal hemorrhoids, pancolonic diverticulosis, 3 small tubular adenomas, and was otherwise normal.  Random colon biopsies were normal.  CT of the abdomen and pelvis with contrast 05/10/2020 showed no findings to suggest active inflammatory bowel disease. It was overall normal except for evidence of her prior cholecystectomy and hysterectomy.  We have been unable to obtain records from Dr. Sherrie Sport or [facility] despite faxing requests.   She returns today reporting ongoing, chronic lower abdominal and right-sided pain, worsened by and constipation. Notes severe gas even up into her chest when she eats. She says her husband has been noting that she is having significant flatus. She is able to massage her abdominal wall to move the air through. Chest pain mimics a heart attack. Using Miralax BID she will have a hard firm BM every other morning. Continues to require manual assistance for full evacuation. Will have a bowel movement every 5 days. Significant straining and manual assistance with defecation.   Reviewed prior treatment efforts: - Miralax QD-BID - GasEx regularly - Mesalamine - no improvement - Methotrexate and folic acide - no improvement - Gluten free diet - which helps with her abdominal pain   Past Medical History:  Diagnosis Date  . Alpha-1-antitrypsin deficiency ([place])   . Asthma   . Bowel obstruction ([place])   . Celiac disease   . Colon polyps   . COPD (chronic obstructive pulmonary disease) ([place])   . Crohn's colitis ([place])   . Gallstones   . IBS (irritable bowel syndrome)   . Ovarian cancer ([place]) 1990  . Pericardial effusion 07/09/2015  . Pleuritic chest pain 07/09/2015    Past Surgical History:  Procedure Laterality Date  . ABDOMINAL HYSTERECTOMY     1989  .  BUNIONECTOMY WITH HAMMERTOE RECONSTRUCTION Right   . CHOLECYSTECTOMY    . COLONOSCOPY  04/25/2020   [place],   . TONSILLECTOMY      Current Outpatient Medications  Medication Sig Dispense Refill  . albuterol (PROVENTIL) (2.5 MG/3ML) 0.083% nebulizer solution Take 3 mLs (2.5 mg total) by nebulization every 4 (four) hours as needed for wheezing or shortness of breath. 75 mL 6  . dicyclomine (BENTYL) 10 MG capsule Take 1 capsule (10 mg total) by mouth 4 (four) times daily -  before meals and at bedtime. 120 capsule 2  . Fluticasone-Umeclidin-Vilant (TRELEGY ELLIPTA) 100-62.5-25 MCG/INH AEPB Inhale 1 puff into the lungs daily. 28 each 12  . linaclotide (LINZESS) 145 MCG CAPS capsule Take 1 capsule (145 mcg total) by mouth daily before breakfast. 30 capsule 2  . zolpidem (AMBIEN) 10 MG tablet Take 1 tablet (10 mg total) by mouth at bedtime. 30 tablet 5   No current facility-administered medications for this visit.    Allergies as of 08/06/2020 - Review Complete 08/06/2020  Allergen Reaction Noted  . Demerol [meperidine] Nausea Only 12/16/2016    Family History  Problem Relation Age of Onset  . Alpha-1 antitrypsin deficiency Mother   . Alpha-1 antitrypsin deficiency Daughter   . Colon cancer Neg Hx   . Rectal cancer Neg Hx   . Stomach cancer Neg Hx   . Prostate cancer Neg Hx      Physical Exam: General:   Alert,  well-nourished, pleasant and cooperative in NAD Head:  Normocephalic and atraumatic. Eyes:  Sclera clear, no icterus.   Conjunctiva pink. Abdomen:  Soft, nontender, nondistended, normal bowel sounds, no rebound or guarding. No hepatosplenomegaly.   Neurologic:  Alert and  oriented x4;  grossly nonfocal Skin:  Intact without significant lesions or rashes except for vitiligo. Psych:  Alert and cooperative. Normal mood and affect.    Janeisha L. Tarri Glenn, MD, MPH 08/06/2020, 2:29 PM

## 2020-08-19 ENCOUNTER — Encounter (HOSPITAL_COMMUNITY): Admission: RE | Payer: Self-pay | Source: Home / Self Care

## 2020-08-19 ENCOUNTER — Ambulatory Visit (HOSPITAL_COMMUNITY): Admission: RE | Admit: 2020-08-19 | Payer: 59 | Source: Home / Self Care | Admitting: Gastroenterology

## 2020-08-19 SURGERY — MANOMETRY, ANORECTAL

## 2020-09-26 ENCOUNTER — Other Ambulatory Visit: Payer: Self-pay

## 2020-09-26 ENCOUNTER — Ambulatory Visit (INDEPENDENT_AMBULATORY_CARE_PROVIDER_SITE_OTHER): Payer: 59 | Admitting: Family Medicine

## 2020-09-26 ENCOUNTER — Encounter: Payer: Self-pay | Admitting: Family Medicine

## 2020-09-26 ENCOUNTER — Ambulatory Visit (INDEPENDENT_AMBULATORY_CARE_PROVIDER_SITE_OTHER): Payer: 59

## 2020-09-26 VITALS — BP 136/87 | HR 75 | Temp 98.0°F | Resp 20 | Ht 67.0 in | Wt 185.0 lb

## 2020-09-26 DIAGNOSIS — M25512 Pain in left shoulder: Secondary | ICD-10-CM

## 2020-09-26 DIAGNOSIS — E7800 Pure hypercholesterolemia, unspecified: Secondary | ICD-10-CM | POA: Diagnosis not present

## 2020-09-26 DIAGNOSIS — F5104 Psychophysiologic insomnia: Secondary | ICD-10-CM | POA: Diagnosis not present

## 2020-09-26 DIAGNOSIS — Z6828 Body mass index (BMI) 28.0-28.9, adult: Secondary | ICD-10-CM

## 2020-09-26 DIAGNOSIS — Z79899 Other long term (current) drug therapy: Secondary | ICD-10-CM

## 2020-09-26 DIAGNOSIS — J449 Chronic obstructive pulmonary disease, unspecified: Secondary | ICD-10-CM

## 2020-09-26 MED ORDER — DICLOFENAC SODIUM 75 MG PO TBEC: 75.0000 mg | DELAYED_RELEASE_TABLET | Freq: Two times a day (BID) | ORAL | 1 refills | Status: DC

## 2020-09-26 MED ORDER — ZOLPIDEM TARTRATE 10 MG PO TABS
10.0000 mg | ORAL_TABLET | Freq: Every day | ORAL | 5 refills | Status: DC
Start: 2020-09-26 — End: 2021-02-21

## 2020-09-26 NOTE — Patient Instructions (Signed)
Shoulder Pain °Many things can cause shoulder pain, including: °An injury to the shoulder. °Overuse of the shoulder. °Arthritis. °The source of the pain can be: °Inflammation. °An injury to the shoulder joint. °An injury to a tendon, ligament, or bone. °Follow these instructions at home: °Pay attention to changes in your symptoms. Let your health care provider know about them. Follow these instructions to relieve your pain. °If you have a sling: °Wear the sling as told by your health care provider. Remove it only as told by your health care provider. °Loosen the sling if your fingers tingle, become numb, or turn cold and blue. °Keep the sling clean. °If the sling is not waterproof: °Do not let it get wet. Remove it to shower or bathe. °Move your arm as little as possible, but keep your hand moving to prevent swelling. °Managing pain, stiffness, and swelling ° °If directed, put ice on the painful area: °Put ice in a plastic bag. °Place a towel between your skin and the bag. °Leave the ice on for 20 minutes, 2-3 times per day. Stop applying ice if it does not help with the pain. °Squeeze a soft ball or a foam pad as much as possible. This helps to keep the shoulder from swelling. It also helps to strengthen the arm. °General instructions °Take over-the-counter and prescription medicines only as told by your health care provider. °Keep all follow-up visits as told by your health care provider. This is important. °Contact a health care provider if: °Your pain gets worse. °Your pain is not relieved with medicines. °New pain develops in your arm, hand, or fingers. °Get help right away if: °Your arm, hand, or fingers: °Tingle. °Become numb. °Become swollen. °Become painful. °Turn white or blue. °Summary °Shoulder pain can be caused by an injury, overuse, or arthritis. °Pay attention to changes in your symptoms. Let your health care provider know about them. °This condition may be treated with a sling, ice, and pain  medicines. °Contact your health care provider if the pain gets worse or new pain develops. Get help right away if your arm, hand, or fingers tingle or become numb, swollen, or painful. °Keep all follow-up visits as told by your health care provider. This is important. °This information is not intended to replace advice given to you by your health care provider. Make sure you discuss any questions you have with your health care provider. °Document Revised: 09/14/2017 Document Reviewed: 09/14/2017 °Elsevier Patient Education © 2022 [employer] Inc. ° °

## 2020-09-26 NOTE — Progress Notes (Signed)
Established Patient Office Visit  Subjective:  Patient ID: Carla Bell, female    DOB: Jun 14, 1960  Age: 60 y.o. MRN: [id number]  CC:  Chief Complaint  Patient presents with   Medication Refill   Shoulder Pain    HPI Carla Bell presents for chronic follow up.  Insomnia She is taking [place]. Reports this is going well. She is going to bed at 9 and wakes up at 6:30. She feels well rested.  2. Left shoulder pain Reports intermittent shoulder pain for year. She pain worsened about 1 week ago. The pain is constant. It feels like pinch and popping at times. The pain ranges from a 4-8/10. It hurts to reach overhead. She has difficulty reaching behind. She has some tingling and numbness that is intermittent down to her fingertips. Denies injury. She is not dropping objects. She has tried ibuprofen and hydrocodone that was prescribed at the hospital.   3. High cholesterol Has been working to improve diet and exercise. She does not wish to have her labs repeated today.   4. COPD Will see pulmonology in August. Reports doing well with current Trelegy.   Past Medical History:  Diagnosis Date   Alpha-1-antitrypsin deficiency ([place])    Asthma    Bowel obstruction ([place])    Celiac disease    Colon polyps    COPD (chronic obstructive pulmonary disease) ([place])    Crohn's colitis ([place])    Gallstones    IBS (irritable bowel syndrome)    Ovarian cancer ([place]) 1990   Pericardial effusion 07/09/2015   Pleuritic chest pain 07/09/2015    Past Surgical History:  Procedure Laterality Date   ABDOMINAL HYSTERECTOMY     1989   BUNIONECTOMY WITH HAMMERTOE RECONSTRUCTION Right    CHOLECYSTECTOMY     COLONOSCOPY  04/25/2020   [place],    TONSILLECTOMY      Family History  Problem Relation Age of Onset   Alpha-1 antitrypsin deficiency Mother    Alpha-1 antitrypsin deficiency Daughter    Colon cancer Neg Hx    Rectal cancer Neg Hx    Stomach cancer Neg Hx     Prostate cancer Neg Hx     Social History   Socioeconomic History   Marital status: Married    Spouse name: Not on file   Number of children: 3   Years of education: 14   Highest education level: Not on file  Occupational History   Occupation: Dietitian  Tobacco Use   Smoking status: Former    Packs/day: 0.25    Years: 2.00    Pack years: 0.50    Types: Cigarettes    Quit date: 03/17/1979    Years since quitting: 41.5   Smokeless tobacco: Never  Vaping Use   Vaping Use: Never used  Substance and Sexual Activity   Alcohol use: No    Alcohol/week: 0.0 standard drinks   Drug use: No   Sexual activity: Not on file  Other Topics Concern   Not on file  Social History Narrative   Lives with husband in a 2 story home.  Has 3 children.  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  Education: 2 years of college.   Social Determinants of Health   Financial Resource Strain: Not on file  Food Insecurity: Not on file  Transportation Needs: Not on file  Physical Activity: Not on file  Stress: Not on file  Social Connections: Not on file  Intimate Partner  Violence: Not on file    Outpatient Medications Prior to Visit  Medication Sig Dispense Refill   Fluticasone-Umeclidin-Vilant (TRELEGY ELLIPTA) 100-62.5-25 MCG/INH AEPB Inhale 1 puff into the lungs daily. 28 each 12   zolpidem (AMBIEN) 10 MG tablet Take 1 tablet (10 mg total) by mouth at bedtime. 30 tablet 5   albuterol (PROVENTIL) (2.5 MG/3ML) 0.083% nebulizer solution Take 3 mLs (2.5 mg total) by nebulization every 4 (four) hours as needed for wheezing or shortness of breath. (Patient not taking: Reported on 09/26/2020) 75 mL 6   dicyclomine (BENTYL) 10 MG capsule Take 1 capsule (10 mg total) by mouth 4 (four) times daily -  before meals and at bedtime. (Patient not taking: Reported on 09/26/2020) 120 capsule 2   linaclotide (LINZESS) 145 MCG CAPS capsule Take 1 capsule (145 mcg total) by mouth daily before  breakfast. (Patient not taking: Reported on 09/26/2020) 30 capsule 2   No facility-administered medications prior to visit.    Allergies  Allergen Reactions   Demerol [Meperidine] Nausea Only    ROS Review of Systems As per HPI.    Objective:    Physical Exam Vitals and nursing note reviewed.  Constitutional:      General: She is not in acute distress.    Appearance: She is not ill-appearing, toxic-appearing or diaphoretic.  Cardiovascular:     Rate and Rhythm: Normal rate and regular rhythm.     Heart sounds: Normal heart sounds. No murmur heard. Pulmonary:     Effort: Pulmonary effort is normal. No respiratory distress.     Breath sounds: Normal breath sounds.  Abdominal:     General: Bowel sounds are normal. There is no distension.     Palpations: Abdomen is soft.     Tenderness: There is no abdominal tenderness.  Musculoskeletal:     Left shoulder: Tenderness (anterior aspect) present. No swelling, deformity or effusion. Decreased range of motion (decreased extension, rotation.).     Right lower leg: No edema.     Left lower leg: No edema.     Comments: Difficult to complete complete shoulder exam due to pain.   Skin:    General: Skin is warm and dry.  Neurological:     General: No focal deficit present.     Mental Status: She is alert and oriented to person, place, and time.  Psychiatric:        Mood and Affect: Mood normal.        Behavior: Behavior normal.    BP 136/87   Pulse 75   Temp 98 F (36.7 C) (Temporal)   Resp 20   Ht 5\' 7"  (1.702 m)   Wt 185 lb (83.9 kg)   SpO2 96%   BMI 28.98 kg/m  Wt Readings from Last 3 Encounters:  09/26/20 185 lb (83.9 kg)  08/06/20 187 lb (84.8 kg)  05/28/20 185 lb 8 oz (84.1 kg)     Health Maintenance Due  Topic Date Due   Zoster Vaccines- Shingrix (1 of 2) Never done   COVID-19 Vaccine (3 - Moderna risk series) 07/12/2019    There are no preventive care reminders to display for this patient.  Lab Results   Component Value Date   TSH 2.290 03/13/2020   Lab Results  Component Value Date   WBC 7.1 03/13/2020   HGB 12.0 03/13/2020   HCT 36.1 03/13/2020   MCV 94 03/13/2020   PLT 263 03/13/2020   Lab Results  Component Value Date   NA  140 03/13/2020   K 3.9 03/13/2020   CO2 27 03/13/2020   GLUCOSE 78 03/13/2020   BUN 13 03/13/2020   CREATININE 0.80 05/10/2020   BILITOT 0.2 03/13/2020   ALKPHOS 93 03/13/2020   AST 19 03/13/2020   ALT 21 03/13/2020   PROT 6.1 03/13/2020   ALBUMIN 4.4 03/13/2020   CALCIUM 9.1 03/13/2020   ANIONGAP 9 06/12/2015   Lab Results  Component Value Date   CHOL 178 03/13/2020   Lab Results  Component Value Date   HDL 60 03/13/2020   Lab Results  Component Value Date   LDLCALC 102 (H) 03/13/2020   Lab Results  Component Value Date   TRIG 87 03/13/2020   Lab Results  Component Value Date   CHOLHDL 3.0 03/13/2020   Lab Results  Component Value Date   HGBA1C 5.3 04/14/2016      Assessment & Plan:   Carla Bell was seen today for medication refill and shoulder pain.  Diagnoses and all orders for this visit:  Chronic insomnia Well controlled on current regimen. Will need Toxassure and new CSA in December.  -     zolpidem (AMBIEN) 10 MG tablet; Take 1 tablet (10 mg total) by mouth at bedtime.  Controlled substance agreement signed -     zolpidem (AMBIEN) 10 MG tablet; Take 1 tablet (10 mg total) by mouth at bedtime.  Pure hypercholesterolemia Diet and exercise. LDL was 102. Declined labs today. Will repeat at next visit.   COPD GOLD II/ MZ/ quit smoking 1981  Well controlled on current regimen. Managed by pulmonology.   BMI 28.0-28.9,adult Diet and exercise.   Acute pain of left shoulder Xray today in office. Difficult to complete full exam due to pain. Referral to ortho and PT placed. Declined prednisone today. Voltaren ordered.  -     DG Shoulder Left; Future -     Ambulatory referral to Orthopedic Surgery -     Ambulatory  referral to Physical Therapy -     diclofenac (VOLTAREN) 75 MG EC tablet; Take 1 tablet (75 mg total) by mouth 2 (two) times daily.   Follow-up: Return in about 20 weeks (around 02/13/2021) for CPE, renew controlled substance contract, toxassure. Sooner for new or worsening symptoms.   The patient indicates understanding of these issues and agrees with the plan.    Gwenlyn Perking, FNP

## 2020-10-03 ENCOUNTER — Ambulatory Visit: Payer: 59 | Attending: Family Medicine

## 2020-10-03 ENCOUNTER — Other Ambulatory Visit: Payer: Self-pay

## 2020-10-03 DIAGNOSIS — R293 Abnormal posture: Secondary | ICD-10-CM | POA: Diagnosis present

## 2020-10-03 DIAGNOSIS — G8929 Other chronic pain: Secondary | ICD-10-CM | POA: Diagnosis present

## 2020-10-03 DIAGNOSIS — M25512 Pain in left shoulder: Secondary | ICD-10-CM | POA: Insufficient documentation

## 2020-10-04 NOTE — Therapy (Signed)
[facility] [place], [place], [address] Phone: [phone]   Fax:  [phone]  Physical Therapy Evaluation  Patient Details  Name: Carla Bell MRN: [id number] Date of Birth: 07-Oct-1960 Referring Provider (PT): Marjorie Smolder   Encounter Date: 10/03/2020   PT End of Session - 10/03/20 1515     Visit Number 1    Number of Visits 12    Date for PT Re-Evaluation 11/14/20    PT Start Time 1430    PT Stop Time [id number]    PT Time Calculation (min) 47 min    Activity Tolerance Patient tolerated treatment well    Behavior During Therapy [facility] for tasks assessed/performed             Past Medical History:  Diagnosis Date   Alpha-1-antitrypsin deficiency ([place])    Asthma    Bowel obstruction ([place])    Celiac disease    Colon polyps    COPD (chronic obstructive pulmonary disease) ([place])    Crohn's colitis ([place])    Gallstones    IBS (irritable bowel syndrome)    Ovarian cancer ([place]) 1990   Pericardial effusion 07/09/2015   Pleuritic chest pain 07/09/2015    Past Surgical History:  Procedure Laterality Date   ABDOMINAL HYSTERECTOMY     1989   BUNIONECTOMY WITH HAMMERTOE RECONSTRUCTION Right    CHOLECYSTECTOMY     COLONOSCOPY  04/25/2020   [place],    TONSILLECTOMY      There were no vitals filed for this visit.    Subjective Assessment - 10/03/20 1433     Subjective Patinet states that two months ago her L shoulder started hurting more than it used to. She sates that it hurts during the day becuase she moves it but at night, she sleeps on tht side and can get it to calm down. She denies history of PT for the shoulder. She states that sometimes it will pop and it will feel better. She states that the pain (7/10) is gong across her shoulder blades. She said it is a 4/10 with heat and ice. She was also given a mild pain medication but it does not help much (she takes two before bed to calm it down with  heat as well. 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 She is a retired Fish farm manager. She continues to have stomach pains with bad gastroitis but manages with a GI MD.    Pertinent History  Alpha-1-antitrypsin deficiency ([place]) ; Asthma  ; Bowel obstruction ([place])  ; Celiac disease  ; Colon polyps ; COPD (chronic obstructive pulmonary disease) ([place])  ; Crohn's colitis ([facility]);Gallstones     IBS (irritable bowel syndrome)     Ovarian cancer ([place]) (1990 - not active)    How long can you sit comfortably? no limitation    How long can you stand comfortably? no limitation    How long can you walk comfortably? no limitation    Patient Stated Goals to be able to use the arm without limitation - to be able to do her own bra without pain    Currently in Pain? Yes    Pain Score 1     Pain Location Shoulder    Pain Orientation Left    Pain Descriptors / Indicators Aching    Pain Type Chronic pain    Pain Onset More than a month ago  Pain Frequency Constant                [facility] PT Assessment - 10/03/20 1430       Assessment   Medical Diagnosis L shoulder Pain    Referring Provider (PT) Tiffany Lilia Pro    Onset Date/Surgical Date 10/04/19    Hand Dominance Right    Next MD Visit TBD    Prior Therapy no      Prior Function   Level of Independence Independent with basic ADLs   requires assistance/modification for donning/doffing brassiere   Vocation Full time employment    Oncologist for elderly      Cognition   Overall Cognitive Status Within Functional Limits for tasks assessed      Sensation   Light Touch Appears Intact      Posture/Postural Control   Posture/Postural Control Postural limitations    Postural Limitations Rounded Shoulders    Posture Comments elevated L shoulder      Deep Tendon Reflexes   DTR Assessment Site Biceps;Brachioradialis;Triceps    Biceps DTR 2+    Brachioradialis DTR  2+    Triceps DTR 2+      ROM / Strength   AROM / PROM / Strength AROM;PROM;Strength      AROM   Overall AROM  Deficits    Overall AROM Comments pain limiting    AROM Assessment Site Shoulder    Right/Left Shoulder Left;Right    Left Shoulder Extension 20 Degrees    Left Shoulder Flexion 112 Degrees   pain   Left Shoulder ABduction 90 Degrees   pain   Left Shoulder Internal Rotation --   fingers to L4   Left Shoulder External Rotation --   pain/ limited to <10deg   Left Shoulder Horizontal ABduction --   pain     PROM   Overall PROM  Deficits    Overall PROM Comments empty end feels    PROM Assessment Site Shoulder    Right/Left Shoulder Left    Left Shoulder Extension 20 Degrees    Left Shoulder Flexion 120 Degrees    Left Shoulder ABduction 90 Degrees      Strength   Overall Strength Due to pain;Deficits;Unable to assess    Strength Assessment Site Shoulder    Right/Left Shoulder Left      Palpation   Palpation comment TTP at the UT/ LS/ and rhomboid region                        Objective measurements completed on examination: See above findings.       [facility] Adult PT Treatment/Exercise - 10/04/20 0001       Exercises   Exercises Shoulder      Shoulder Exercises: Isometric Strengthening   External Rotation 3X3"    Internal Rotation 3X3"      Modalities   Modalities Electrical Stimulation      Electrical Stimulation   Electrical Stimulation Location Shoulder (UT )    Electrical Stimulation Action IFC    Electrical Stimulation Parameters 80-'150Hz'$     Electrical Stimulation Goals Pain;Edema;Tone                    PT Education - 10/03/20 1600     Education Details HEP - use of TENS; use of isometric exercises    Person(s) Educated Patient    Methods Explanation;Demonstration    Comprehension Verbalized understanding;Returned demonstration  PT Short Term Goals - 10/04/20 1308       PT SHORT TERM GOAL #1    Title Independent with a HEP.    Baseline lacks awarenees of initial HEP    Time 1    Period Weeks    Status New    Target Date 10/11/20      PT SHORT TERM GOAL #2   Title Increase ROM so patient is able to reach behind back.    Time 1    Period Weeks    Status New    Target Date 10/11/20               PT Long Term Goals - 10/03/20 1309       PT LONG TERM GOAL #1   Title Active shoulder flexion to 145 degrees so the patient can easily reach overhead.    Time 6    Period Weeks    Status New    Target Date 11/15/20      PT LONG TERM GOAL #2   Title Active ER to 70 degrees+ to allow for easily donning/doffing of apparel.    Time 6    Period Weeks    Status New    Target Date 11/15/20      PT LONG TERM GOAL #3   Title Increase shoulder strength to a solid 4+/5 to increase stability for performance of functional activities.      PT LONG TERM GOAL #4   Title Perform ADL's with pain not > 3/10.                    Plan - 10/03/20 1535     Clinical Impression Statement Carla Bell is a pleasant 60 yo female with history of chronic shoulder pain that has worsened over the past few weeks. She presents with signs and symptoms consistent with referral diagnosis of L shoulder rotator cuff derrangement. Patient tolerates therapeutic intervention initiated today to reduce present limitations of the upper extremity along the supraspinatus and upper trapezius. Pain modulation to be addressed through modalties and consistent low grade isometric exercises. Conservative therapy recommended to promote increased range of motion, strenght, and functional mobility.    Personal Factors and Comorbidities Comorbidity 1;Past/Current Experience;Time since onset of injury/illness/exacerbation    Examination-Activity Limitations Bed Mobility;Carry;Dressing;Lift;Hygiene/Grooming   requires assistance with scratching, and washing her back. Assistance needed for [employer]    Examination-Participation Restrictions Cleaning;Interpersonal Relationship;Driving;Laundry;Other    Stability/Clinical Decision Making Evolving/Moderate complexity    Clinical Decision Making Low    Rehab Potential Excellent    PT Frequency 2x / week    PT Duration 6 weeks    PT Treatment/Interventions Moist Heat;Neuromuscular re-education;Therapeutic exercise;Therapeutic activities;Manual techniques;Electrical Stimulation;Cryotherapy;Vasopneumatic Device;Patient/family education    PT Next Visit Plan PROM / AAROM to tolerance; modalities as needed, low grade isometrics, table slides as tolerated, ** scapular and thoracic mobility**    Consulted and Agree with Plan of Care Patient             Patient will benefit from skilled therapeutic intervention in order to improve the following deficits and impairments:  Decreased range of motion, Impaired tone, Impaired UE functional use, Increased muscle spasms, Pain, Decreased activity tolerance, Impaired flexibility, Other (comment), Decreased strength, Decreased mobility  Visit Diagnosis: Chronic left shoulder pain  Abnormal posture     Problem List Patient Active Problem List   Diagnosis Date Noted   Sciatic neuropathy, right 12/10/2016   S/P cholecystectomy 07/15/2015  Hx SBO 07/15/2015   Dyspnea 07/12/2015   COPD GOLD II/ MZ/ quit smoking 1981  07/12/2015   Pericardial effusion 07/09/2015   Pleuritic chest pain 07/09/2015   Alpha-1-antitrypsin deficiency carrier 06/14/2015   Vitiligo 06/14/2015   COPD exacerbation ([place]) 06/14/2015   History of ovarian cancer, sp TAH and bilat salpingo oophorectomy, chemo, radiation 06/14/1987    Marylou Mccoy PT, DPT  10/04/2020, 1:19 PM  [facility] [address] [place], [place], [address] Phone: [phone]   Fax:  [phone]  Name: Carla Bell MRN: [id number] Date of Birth: Sep 11, 1960

## 2020-10-08 ENCOUNTER — Ambulatory Visit: Payer: 59

## 2020-10-09 ENCOUNTER — Other Ambulatory Visit: Payer: Self-pay

## 2020-10-09 ENCOUNTER — Ambulatory Visit (INDEPENDENT_AMBULATORY_CARE_PROVIDER_SITE_OTHER): Payer: 59 | Admitting: Orthopaedic Surgery

## 2020-10-09 ENCOUNTER — Encounter: Payer: Self-pay | Admitting: Orthopaedic Surgery

## 2020-10-09 DIAGNOSIS — M7542 Impingement syndrome of left shoulder: Secondary | ICD-10-CM | POA: Diagnosis not present

## 2020-10-09 MED ORDER — BUPIVACAINE HCL 0.5 % IJ SOLN
2.0000 mL | INTRAMUSCULAR | Status: AC | PRN
  Administered 2020-10-09: 2 mL via INTRA_ARTICULAR

## 2020-10-09 NOTE — Progress Notes (Signed)
Office Visit Note   Patient: Carla Bell           Date of Birth: [birth year]           MRN: [id number] Visit Date: 10/09/2020              Requested by: Gwenlyn Perking, [place],  [place] [address] PCP: Gwenlyn Perking, FNP   Assessment & Plan: Visit Diagnoses:  1. Impingement syndrome of left shoulder     Plan: Initial onset of left shoulder pain insidiously about a year ago but worse over the past 2 months without any injury or trauma.  No neck pain or numbness or tingling.  Has had 1 physical therapy visit through her primary care physician and is tried diclofenac.  X-rays were obtained through her primary care physician's office and were negative for any obvious abnormality.  No ectopic calcification.  Certainly has impingement syndrome.  After much discussion I am going to inject the subacromial space with betamethasone and monitor response.  She had immediate relief of most of her pain and was able to raise her arm over her head.  Within the next 3 weeks if she is not doing any better it is worth obtaining an MRI scan with the possibility of a rotator cuff tear  Follow-Up Instructions: Return if symptoms worsen or fail to improve.   Orders:  No orders of the defined types were placed in this encounter.  No orders of the defined types were placed in this encounter.     Procedures: Large Joint Inj: L subacromial bursa on 10/09/2020 1:53 PM Indications: pain and diagnostic evaluation Details: 25 G 1.5 in needle, anterolateral approach  Arthrogram: No  Medications: 2 mL bupivacaine 0.5 %  12 mg betamethasone injected with Marcaine into the subacromial space left shoulder Consent was given by the patient. Immediately prior to procedure a time out was called to verify the correct patient, procedure, equipment, support staff and site/side marked as required. Patient was prepped and draped in the usual sterile fashion.      Clinical Data: No  additional findings.   Subjective: Chief Complaint  Patient presents with   Left Shoulder - Pain  Patient presents today for left shoulder pain. She said that it has been bothering her for a year, but worsened in the last two months. She said that she has burning and tingling into her left arm. She has decreased range of motion. She has pain all throughout her arm, but worse at the superior lateral aspect with lifting her arm. She has been to one visit of physical therapy and had x-rays done at [facility] PCP. She is right hand dominant.   HPI  Review of Systems   Objective: Vital Signs: Ht 5' 7.5" (1.715 m)   Wt 182 lb (82.6 kg)   BMI 28.08 kg/m   Physical Exam Constitutional:      Appearance: She is well-developed.  Eyes:     Pupils: Pupils are equal, round, and reactive to light.  Pulmonary:     Effort: Pulmonary effort is normal.  Skin:    General: Skin is warm and dry.  Neurological:     Mental Status: She is alert and oriented to person, place, and time.  Psychiatric:        Behavior: Behavior normal.    Ortho Exam awake alert and oriented x3.  Comfortable sitting.  Uncomfortable with motion of her left shoulder.  He could  abduct about 60 degrees with pain and flex about the same with considerable pain in the anterior lateral subacromial region.  Patient could feel a popping but I did not feel any grating.  Neurologically intact.  After subacromial cortisone injection she could raise her arm overhead and abduct easily to 90 degrees but she can still feel a.  Specialty Comments:  No specialty comments available.  Imaging: No results found.   PMFS History: Patient Active Problem List   Diagnosis Date Noted   Impingement syndrome of left shoulder 10/09/2020   Sciatic neuropathy, right 12/10/2016   S/P cholecystectomy 07/15/2015   Hx SBO 07/15/2015   Dyspnea 07/12/2015   COPD GOLD II/ MZ/ quit smoking 1981  07/12/2015   Pericardial effusion 07/09/2015    Pleuritic chest pain 07/09/2015   Alpha-1-antitrypsin deficiency carrier 06/14/2015   Vitiligo 06/14/2015   COPD exacerbation ([place]) 06/14/2015   History of ovarian cancer, sp TAH and bilat salpingo oophorectomy, chemo, radiation 06/14/1987   Past Medical History:  Diagnosis Date   Alpha-1-antitrypsin deficiency ([place])    Asthma    Bowel obstruction ([facility])    Celiac disease    Colon polyps    COPD (chronic obstructive pulmonary disease) ([facility])    Crohn's colitis ([facility])    Gallstones    IBS (irritable bowel syndrome)    Ovarian cancer ([place]) 1990   Pericardial effusion 07/09/2015   Pleuritic chest pain 07/09/2015    Family History  Problem Relation Age of Onset   Alpha-1 antitrypsin deficiency Mother    Alpha-1 antitrypsin deficiency Daughter    Colon cancer Neg Hx    Rectal cancer Neg Hx    Stomach cancer Neg Hx    Prostate cancer Neg Hx     Past Surgical History:  Procedure Laterality Date   ABDOMINAL HYSTERECTOMY     1989   BUNIONECTOMY WITH HAMMERTOE RECONSTRUCTION Right    CHOLECYSTECTOMY     COLONOSCOPY  04/25/2020   [place],    TONSILLECTOMY     Social History   Occupational History   Occupation: Dietitian  Tobacco Use   Smoking status: Former    Packs/day: 0.25    Years: 2.00    Pack years: 0.50    Types: Cigarettes    Quit date: 03/17/1979    Years since quitting: 41.5   Smokeless tobacco: Never  Vaping Use   Vaping Use: Never used  Substance and Sexual Activity   Alcohol use: No    Alcohol/week: 0.0 standard drinks   Drug use: No   Sexual activity: Not on file

## 2020-10-10 ENCOUNTER — Encounter: Payer: 59 | Admitting: Physical Therapy

## 2020-10-18 ENCOUNTER — Ambulatory Visit (INDEPENDENT_AMBULATORY_CARE_PROVIDER_SITE_OTHER): Payer: 59 | Admitting: Pulmonary Disease

## 2020-10-18 ENCOUNTER — Other Ambulatory Visit: Payer: Self-pay

## 2020-10-18 ENCOUNTER — Encounter: Payer: Self-pay | Admitting: Pulmonary Disease

## 2020-10-18 VITALS — BP 126/80 | HR 83 | Temp 98.0°F | Ht 67.0 in | Wt 187.0 lb

## 2020-10-18 DIAGNOSIS — R0600 Dyspnea, unspecified: Secondary | ICD-10-CM | POA: Diagnosis not present

## 2020-10-18 DIAGNOSIS — R0602 Shortness of breath: Secondary | ICD-10-CM

## 2020-10-18 DIAGNOSIS — Z148 Genetic carrier of other disease: Secondary | ICD-10-CM | POA: Diagnosis not present

## 2020-10-18 DIAGNOSIS — J449 Chronic obstructive pulmonary disease, unspecified: Secondary | ICD-10-CM | POA: Diagnosis not present

## 2020-10-18 DIAGNOSIS — R0609 Other forms of dyspnea: Secondary | ICD-10-CM

## 2020-10-18 LAB — PULMONARY FUNCTION TEST
DL/VA % pred: 112 %
DL/VA: 4.6 ml/min/mmHg/L
DLCO cor % pred: 101 %
DLCO cor: 22.93 ml/min/mmHg
DLCO unc % pred: 99 %
DLCO unc: 22.57 ml/min/mmHg
FEF 25-75 Post: 1.19 L/sec
FEF 25-75 Pre: 1.05 L/sec
FEF2575-%Change-Post: 13 %
FEF2575-%Pred-Post: 47 %
FEF2575-%Pred-Pre: 41 %
FEV1-%Change-Post: 3 %
FEV1-%Pred-Post: 68 %
FEV1-%Pred-Pre: 66 %
FEV1-Post: 1.97 L
FEV1-Pre: 1.9 L
FEV1FVC-%Change-Post: 2 %
FEV1FVC-%Pred-Pre: 85 %
FEV6-%Change-Post: 1 %
FEV6-%Pred-Post: 79 %
FEV6-%Pred-Pre: 78 %
FEV6-Post: 2.86 L
FEV6-Pre: 2.83 L
FEV6FVC-%Change-Post: 0 %
FEV6FVC-%Pred-Post: 101 %
FEV6FVC-%Pred-Pre: 102 %
FVC-%Change-Post: 1 %
FVC-%Pred-Post: 78 %
FVC-%Pred-Pre: 77 %
FVC-Post: 2.91 L
FVC-Pre: 2.87 L
Post FEV1/FVC ratio: 68 %
Post FEV6/FVC ratio: 98 %
Pre FEV1/FVC ratio: 66 %
Pre FEV6/FVC Ratio: 99 %
RV % pred: 122 %
RV: 2.66 L
TLC % pred: 99 %
TLC: 5.57 L

## 2020-10-18 MED ORDER — TRELEGY ELLIPTA 100-62.5-25 MCG/INH IN AEPB: 100.0000 ug | INHALATION_SPRAY | Freq: Every day | RESPIRATORY_TRACT | 11 refills | Status: DC

## 2020-10-18 NOTE — Progress Notes (Signed)
PFT done today. 

## 2020-10-18 NOTE — Patient Instructions (Signed)
Continue graded exercise as tolerated  Continue Trelegy  Call with significant concerns  I will see you a year from now

## 2020-10-18 NOTE — Progress Notes (Signed)
Carla Bell    [id number]    17-Jul-1960  Primary Care Physician:Morgan, Arman Bogus, FNP  Referring Physician: Gwenlyn Perking, [address] [place],  [place] [address]  Chief complaint:   Patient with a history of alpha-1 deficiency Shortness of breath on exertion  HPI:  Has been relatively stable No shortness of breath with normal activity Any significant exertion still causes limitations  Has been using Trelegy on a regular basis and seems to be benefiting from it Rarely uses albuterol  History of alpha-1 antitrypsin deficiency  She is active, trying to get back into exercising regularly  Alpha-1 levels when was last checked was about 85  PFT previously did show significant bronchodilator response  Very remote history of social smoking  Family history of lung disease  Outpatient Encounter Medications as of 10/18/2020  Medication Sig   albuterol (PROVENTIL) (2.5 MG/3ML) 0.083% nebulizer solution Take 3 mLs (2.5 mg total) by nebulization every 4 (four) hours as needed for wheezing or shortness of breath.   diclofenac (VOLTAREN) 75 MG EC tablet Take 1 tablet (75 mg total) by mouth 2 (two) times daily.   Fluticasone-Umeclidin-Vilant (TRELEGY ELLIPTA) 100-62.5-25 MCG/INH AEPB Inhale 1 puff into the lungs daily.   zolpidem (AMBIEN) 10 MG tablet Take 1 tablet (10 mg total) by mouth at bedtime.   [DISCONTINUED] dicyclomine (BENTYL) 10 MG capsule Take 1 capsule (10 mg total) by mouth 4 (four) times daily -  before meals and at bedtime. (Patient not taking: Reported on 10/18/2020)   [DISCONTINUED] linaclotide (LINZESS) 145 MCG CAPS capsule Take 1 capsule (145 mcg total) by mouth daily before breakfast. (Patient not taking: Reported on 10/18/2020)   No facility-administered encounter medications on file as of 10/18/2020.    Allergies as of 10/18/2020 - Review Complete 10/18/2020  Allergen Reaction Noted   Demerol [meperidine] Nausea Only 12/16/2016    Past  Medical History:  Diagnosis Date   Alpha-1-antitrypsin deficiency ([facility])    Asthma    Bowel obstruction ([place])    Celiac disease    Colon polyps    COPD (chronic obstructive pulmonary disease) ([facility])    Crohn's colitis ([facility] [place])    Gallstones    IBS (irritable bowel syndrome)    Ovarian cancer ([facility] [facility]) 1990   Pericardial effusion 07/09/2015   Pleuritic chest pain 07/09/2015    Past Surgical History:  Procedure Laterality Date   ABDOMINAL HYSTERECTOMY     1989   BUNIONECTOMY WITH HAMMERTOE RECONSTRUCTION Right    CHOLECYSTECTOMY     COLONOSCOPY  04/25/2020   [place],    TONSILLECTOMY      Family History  Problem Relation Age of Onset   Alpha-1 antitrypsin deficiency Mother    Alpha-1 antitrypsin deficiency Daughter    Colon cancer Neg Hx    Rectal cancer Neg Hx    Stomach cancer Neg Hx    Prostate cancer Neg Hx     Social History   Socioeconomic History   Marital status: Married    Spouse name: Not on file   Number of children: 3   Years of education: 14   Highest education level: Not on file  Occupational History   Occupation: Dietitian  Tobacco Use   Smoking status: Former    Packs/day: 0.25    Years: 2.00    Pack years: 0.50    Types: Cigarettes    Quit date: 03/17/1979    Years since quitting: 48.6  Smokeless tobacco: Never  Vaping Use   Vaping Use: Never used  Substance and Sexual Activity   Alcohol use: No    Alcohol/week: 0.0 standard drinks   Drug use: No   Sexual activity: Not on file  Other Topics Concern   Not on file  Social History Narrative   Lives with husband in a 2 story home.  Has 3 children.  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  Education: 2 years of college.   Social Determinants of Health   Financial Resource Strain: Not on file  Food Insecurity: Not on file  Transportation Needs: Not on file  Physical Activity: Not on file  Stress: Not on file  Social Connections: Not on file  Intimate Partner  Violence: Not on file    Review of Systems  Constitutional:  Negative for fatigue.  Respiratory:  Positive for shortness of breath.   Psychiatric/Behavioral:  Negative for sleep disturbance.    Vitals:   10/18/20 1606  BP: 126/80  Pulse: 83  Temp: 98 F (36.7 C)  SpO2: 98%     Physical Exam Constitutional:      Appearance: Normal appearance.  HENT:     Mouth/Throat:     Mouth: Mucous membranes are moist.  Eyes:     General:        Right eye: No discharge.        Left eye: No discharge.  Cardiovascular:     Rate and Rhythm: Normal rate and regular rhythm.     Pulses: Normal pulses.     Heart sounds: Normal heart sounds. No murmur heard.   No friction rub.  Pulmonary:     Effort: No respiratory distress.     Breath sounds: No stridor. No wheezing or rhonchi.  Musculoskeletal:     Cervical back: No rigidity or tenderness.  Neurological:     Mental Status: She is alert.  Psychiatric:        Mood and Affect: Mood normal.     Data Reviewed: Previous PFT from 2017 reviewed showing mild obstructive lung disease with significant bronchodilator response Current PFT was reviewed with the patient showing no significant change compared to previous showing mild obstructive disease with no significant bronchodilator response  Previous chest x-ray with hyperinflated lung fields-reviewed by myself  Assessment:  Dyspnea on exertion -Appears to be more stable with Trelegy  History of obstructive lung disease -More stable with Trelegy  History of alpha-1 deficiency -Last level checked was 58  Trelegy seems to have made a difference-tolerates it well and seems to be helping shortness of breath  Past history of pericardial effusion   Plan/Recommendations: Graded exercise as tolerated  Tentative follow-up in a year from now  Encouraged to call with any significant concerns   Sherrilyn Rist MD [facility] 10/18/2020, 4:12 PM  CC: Gwenlyn Perking, FNP

## 2020-10-21 ENCOUNTER — Ambulatory Visit (INDEPENDENT_AMBULATORY_CARE_PROVIDER_SITE_OTHER): Payer: 59 | Admitting: Gastroenterology

## 2020-10-21 ENCOUNTER — Encounter: Payer: Self-pay | Admitting: Gastroenterology

## 2020-10-21 VITALS — BP 118/68 | HR 76 | Ht 67.0 in | Wt 186.2 lb

## 2020-10-21 DIAGNOSIS — K2970 Gastritis, unspecified, without bleeding: Secondary | ICD-10-CM | POA: Diagnosis not present

## 2020-10-21 DIAGNOSIS — R140 Abdominal distension (gaseous): Secondary | ICD-10-CM

## 2020-10-21 DIAGNOSIS — K2990 Gastroduodenitis, unspecified, without bleeding: Secondary | ICD-10-CM

## 2020-10-21 DIAGNOSIS — R198 Other specified symptoms and signs involving the digestive system and abdomen: Secondary | ICD-10-CM | POA: Diagnosis not present

## 2020-10-21 DIAGNOSIS — R0989 Other specified symptoms and signs involving the circulatory and respiratory systems: Secondary | ICD-10-CM

## 2020-10-21 MED ORDER — DICYCLOMINE HCL 10 MG PO CAPS: 10.0000 mg | ORAL_CAPSULE | Freq: Three times a day (TID) | ORAL | 11 refills | Status: DC

## 2020-10-21 MED ORDER — PANTOPRAZOLE SODIUM 40 MG PO TBEC: 40.0000 mg | DELAYED_RELEASE_TABLET | Freq: Two times a day (BID) | ORAL | 11 refills | Status: DC

## 2020-10-21 NOTE — Patient Instructions (Signed)
We have sent the following medications to your pharmacy for you to pick up at your convenience: pantoprazole 40 mg one tablet by mouth twice daily and dicyclomine 10 mg one capsules by mouth four times a day.   Call our office in 2-3 weeks with a update on your symptoms.   Due to recent changes in healthcare laws, you may see the results of your imaging and laboratory studies on MyChart before your provider has had a chance to review them.  We understand that in some cases there may be results that are confusing or concerning to you. Not all laboratory results come back in the same time frame and the provider may be waiting for multiple results in order to interpret others.  Please give [place] 48 hours in order for your provider to thoroughly review all the results before contacting the office for clarification of your results.   The [facility] providers would like to encourage you to use [facility] to communicate with providers for non-urgent requests or questions.  Due to long hold times on the telephone, sending your provider a message by [facility] may be a faster and more efficient way to get a response.  Please allow 48 business hours for a response.  Please remember that this is for non-urgent requests.

## 2020-10-21 NOTE — Progress Notes (Addendum)
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 Distant history of 23 colon polyps    - 3 tubular adenomas removed on colonoscopy 04/25/20    - surveillance recommended in 2025  I suspect she has constipation-predominant IBS with pelvic floor dysnnergia. Acid peptic problems may be contributing given gastritis seen on EGD. Globus is now a predominant symptom.  She may have gluten intolerance and I've encouraged her to continue the diet as is makes a marked difference in her pain. Symptoms may also improve with better management of constipation. She is sensitive to diarrhea, so will proceed with treatment cautiously.     PLAN: - Start pantoprazole 40 mg BID - Dicylcomine 10 mg QID - recommended but not started on her visit in 07/2020 - Call in 2-3 weeks with a symptom update - Trial of Xifaxan 550 mg TID x 14 days if no improvement to the above therapies - Consider Motegrity as an option in the future - Consider FODMAP diet in the future (brochure provided today) - Office follow-up ain 3 months, earlier if needed  Please see the "Patient Instructions" section for addition details about the plan.  HPI: Carla Bell is a 60 y.o. female who returns in follow-up for multiple GI problems.  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 She had a lap chole  09/01/13 for symptomatic cholelithiasis with chronic cholecystitis with Dr. Charlesetta Ivory at [facility] as an elder sitter.   At the time of her initial consultation 03/19/20 she reported multiple chronic gastrointestinal problems including irritable bowel syndrome, celiac disease, and ulcerative colitis with ongoing symptoms of requent nausea, chronic lower abdominal and right-sided abdominal pain, worsened by constipation.  Please see that note for complete details.    In an effort to sort out her diagnoses, She had an EGD and colonoscopy 05/05/20. EGD showed a Hill grade 3 hiatal hernia and gastritis.  Gastric biopsy showed mild chronic gastritis.  There was no H. pylori.  Duodenal biopsies were normal.  Colonoscopy performed to the terminal ileum revealed nonbleeding internal hemorrhoids, pancolonic diverticulosis, 3 small tubular adenomas, and was otherwise normal.  Random colon biopsies were normal.  CT of the abdomen and pelvis with contrast 05/10/2020 showed no findings to suggest active inflammatory bowel disease. It was overall normal except for evidence of her prior cholecystectomy and hysterectomy.  Asymptomatic when she awakes.  Constant upper abdominal pain, bloating, gassiness, and esophageal fullness that is progressive over the day. Ongoing globus that is present between meals. She has some constipation but she doesn't think that is contributing. Using Miralax BID she will have a hard firm BM every other morning. Continues to require manual assistance for full evacuation. Will have a bowel movement every 5 days. Significant straining and manual assistance with defecation.  No change without removing coffee, tea, or fatty foods from the diet.   Reviewed prior treatment efforts: - Miralax QD-BID - GasEx regularly - Mesalamine -  no improvement - Methotrexate and folic acide - no improvement - Gluten free diet - which helps with her abdominal pain - Linzess did not  provide relief   Past Medical History:  Diagnosis Date   Alpha-1-antitrypsin deficiency ([facility])    Asthma    Bowel obstruction ([facility])    Celiac disease    Colon polyps    COPD (chronic obstructive pulmonary disease) ([facility])    Crohn's colitis ([place])    Gallstones    IBS (irritable bowel syndrome)    Ovarian cancer ([place]) 1990   Pericardial effusion 07/09/2015   Pleuritic chest pain 07/09/2015   Rotator cuff tear    left    Past Surgical History:  Procedure Laterality Date   ABDOMINAL HYSTERECTOMY     1989   BUNIONECTOMY WITH HAMMERTOE RECONSTRUCTION Right    CHOLECYSTECTOMY     COLONOSCOPY  04/25/2020   [place],    TONSILLECTOMY      Current Outpatient Medications  Medication Sig Dispense Refill   albuterol (PROVENTIL) (2.5 MG/3ML) 0.083% nebulizer solution Take 3 mLs (2.5 mg total) by nebulization every 4 (four) hours as needed for wheezing or shortness of breath. 75 mL 6   Calcium Carbonate-Simethicone (ALKA-SELTZER HEARTBURN + GAS) 750-80 MG CHEW Chew 1 tablet by mouth as needed.     dicyclomine (BENTYL) 10 MG capsule Take 1 capsule (10 mg total) by mouth 4 (four) times daily -  before meals and at bedtime. 120 capsule 11   Fluticasone-Umeclidin-Vilant (TRELEGY ELLIPTA) 100-62.5-25 MCG/INH AEPB Inhale 100 mcg into the lungs daily. 28 each 11   pantoprazole (PROTONIX) 40 MG tablet Take 1 tablet (40 mg total) by mouth 2 (two) times daily. 60 tablet 11   Simethicone (PHAZYME MAXIMUM STRENGTH PO) Take 500 mg by mouth 2 (two) times daily before a meal.     zolpidem (AMBIEN) 10 MG tablet Take 1 tablet (10 mg total) by mouth at bedtime. 30 tablet 5   No current facility-administered medications for this visit.    Allergies as of 10/21/2020 - Review Complete 10/21/2020  Allergen Reaction Noted   Demerol [meperidine] Nausea Only 12/16/2016     Physical Exam: General:   Alert,  well-nourished, pleasant and cooperative in NAD Head:  Normocephalic and atraumatic. Eyes:   Sclera clear, no icterus.   Conjunctiva pink. Abdomen:  Soft, nontender, nondistended, normal bowel sounds, no rebound or guarding. No hepatosplenomegaly.   Neurologic:  Alert and  oriented x4;  grossly nonfocal Skin:  Intact without significant lesions or rashes except for vitiligo. Psych:  Alert and cooperative. Normal mood and affect.    Carla L. Tarri Glenn, MD, MPH 10/24/2020, 11:42 AM

## 2020-10-24 ENCOUNTER — Encounter: Payer: Self-pay | Admitting: Gastroenterology

## 2020-11-25 ENCOUNTER — Other Ambulatory Visit: Payer: Self-pay | Admitting: Pulmonary Disease

## 2020-11-25 ENCOUNTER — Telehealth: Payer: Self-pay | Admitting: Pulmonary Disease

## 2020-11-25 MED ORDER — PROMETHAZINE-CODEINE 6.25-10 MG/5ML PO SYRP: 5.0000 mL | ORAL_SOLUTION | ORAL | 0 refills | Status: DC | PRN

## 2020-11-25 MED ORDER — PROMETHAZINE-CODEINE 6.25-10 MG/5ML PO SYRP: 5.0000 mL | ORAL_SOLUTION | ORAL | 0 refills | Status: AC | PRN

## 2020-11-25 NOTE — Telephone Encounter (Signed)
Called and spoke with Patient.  Patient made aware prescription was sent to requested pharmacy by Dr. Ander Slade. Understanding stated. Nothing further at this time.

## 2020-11-25 NOTE — Telephone Encounter (Signed)
Phenergan with codeine called to [facility]

## 2020-11-25 NOTE — Telephone Encounter (Signed)
Called and spoke with patient. She stated that she tested positive for COVID on 11/23/20. She began to have symptoms of a headache and fever that started on 11/21/20. Her cough worsen on Friday. Due to these symptoms, she decided to test herself at home and it came back positive.   She has received 2 Moderna covid vaccines.   Her cough has been non-productive. The phlegm in her chest is too thick for her to cough up. She last had a fever yesterday afternoon. She is starting to feel better but the cough will not go away since she is not able to break up the phlegm. Mucinex has not been helping.   She is also not taking an antiviral medications.   She is requesting to have something called in for the cough. Pharmacy is [facility] in [place].   AO, can you please advise? Thanks!

## 2020-11-25 NOTE — Progress Notes (Signed)
Cough medicine called to [facility] with codeine

## 2020-11-26 ENCOUNTER — Telehealth: Payer: Self-pay | Admitting: Pulmonary Disease

## 2020-11-26 NOTE — Telephone Encounter (Signed)
[place] and was advised the Phenergan with Codeine will no longer be accepted by any [facility] because of an increase in forgeries (informed clinical lead). The Phenergan with Codeine script that was sent on 9/12 was null and void. Called and spoke to pt, pt states script can be sent to [facility]. Medication has been pended and pharmacy has been changed in pt's chart.    Dr. Ander Slade, please advise. Thanks.

## 2020-11-26 NOTE — Telephone Encounter (Signed)
Pt called back and states [facility] will not accept the script either. Called [facility] to verify this, confirmed they do not accept it. Called [facility] in [place] and was informed they will accept the Phenergan with Codeine script. Pharmacy changed in pt's chart.   Will send back to Dr. Ander Slade to send in.

## 2021-02-11 DIAGNOSIS — J101 Influenza due to other identified influenza virus with other respiratory manifestations: Secondary | ICD-10-CM | POA: Insufficient documentation

## 2021-02-17 ENCOUNTER — Telehealth: Payer: Self-pay

## 2021-02-17 NOTE — Telephone Encounter (Addendum)
Transition Care Management Unsuccessful Follow-up Telephone Call  Date of discharge and from where:  02/14/21 - [facility] - acute respiratory failure/ influenza A  Attempts:  1st Attempt  Reason for unsuccessful TCM follow-up call:  No answer/busy voice mail not set up yet  She has a TCM appt already scheduled for 12/8 @ 11am - I just need to ask her the TCM call questions.   Transition Care Management Unsuccessful Follow-up Telephone Call  Date of discharge and from where:  02/14/21 - [facility] - acute respiratory failure due to influenza A  Attempts:  2nd Attempt  Reason for unsuccessful TCM follow-up call:  Unable to leave message - voice mail not set up yet  Unable to reach patient for TCM call - since multiple attempts were made, still okay to charge for TCM if she shows for appt tomorrow

## 2021-02-18 ENCOUNTER — Encounter: Payer: 59 | Admitting: Family

## 2021-02-18 ENCOUNTER — Encounter: Payer: Self-pay | Admitting: Family

## 2021-02-18 NOTE — Progress Notes (Signed)
Attempted to call patient multiple times. I can not find a number that is active in her chart. Will close chart.   Evelina Dun, FNP

## 2021-02-19 ENCOUNTER — Encounter: Payer: Self-pay | Admitting: Nurse Practitioner

## 2021-02-19 ENCOUNTER — Other Ambulatory Visit: Payer: Self-pay

## 2021-02-19 ENCOUNTER — Ambulatory Visit (INDEPENDENT_AMBULATORY_CARE_PROVIDER_SITE_OTHER): Payer: 59 | Admitting: Nurse Practitioner

## 2021-02-19 ENCOUNTER — Ambulatory Visit: Payer: 59 | Admitting: Family Medicine

## 2021-02-19 VITALS — BP 138/78 | HR 75 | Temp 97.1°F | Ht 67.0 in | Wt 186.8 lb

## 2021-02-19 DIAGNOSIS — B3731 Acute candidiasis of vulva and vagina: Secondary | ICD-10-CM | POA: Diagnosis not present

## 2021-02-19 LAB — WET PREP FOR TRICH, YEAST, CLUE
Clue Cell Exam: NEGATIVE
Trichomonas Exam: NEGATIVE
Yeast Exam: NEGATIVE

## 2021-02-19 MED ORDER — FLUCONAZOLE 150 MG PO TABS: 150.0000 mg | ORAL_TABLET | Freq: Once | ORAL | 0 refills | Status: AC

## 2021-02-19 NOTE — Assessment & Plan Note (Signed)
Vaginal yeast infection not well controlled.  Worsened by prolonged antibiotic treatment from hospital stay.  Completed wet prep results pending.  Fluconazole 150 mg tablet Rx sent to pharmacy.  Follow-up with unresolved symptoms.

## 2021-02-19 NOTE — Patient Instructions (Signed)

## 2021-02-19 NOTE — Progress Notes (Signed)
Acute Office Visit  Subjective:    Patient ID: Carla Bell, female    DOB: 06/08/1960, 60 y.o.   MRN: [id number]  Chief Complaint  Patient presents with   Vaginitis    Was in [facility] last week till Fri, had yeast inf from abx   Medication Refill    Ambien refill    Vaginal Discharge The patient's primary symptoms include vaginal discharge. The patient's pertinent negatives include no genital rash, pelvic pain or vaginal bleeding. This is a new problem. The current episode started yesterday. The problem occurs constantly. The problem has been unchanged. The patient is experiencing no pain. Pertinent negatives include no abdominal pain, dysuria, flank pain, hematuria, sore throat or urgency. The vaginal discharge was milky. There has been no bleeding. Exacerbated by: complicated by antibiotic use. She has tried nothing for the symptoms. It is unknown whether or not her partner has an STD.     Past Medical History:  Diagnosis Date   Alpha-1-antitrypsin deficiency ([place])    Asthma    Bowel obstruction ([place])    Celiac disease    Colon polyps    COPD (chronic obstructive pulmonary disease) ([place])    Crohn's colitis ([place])    Gallstones    IBS (irritable bowel syndrome)    Ovarian cancer ([place]) 1990   Pericardial effusion 07/09/2015   Pleuritic chest pain 07/09/2015   Rotator cuff tear    left    Past Surgical History:  Procedure Laterality Date   ABDOMINAL HYSTERECTOMY     1989   BUNIONECTOMY WITH HAMMERTOE RECONSTRUCTION Right    CHOLECYSTECTOMY     COLONOSCOPY  04/25/2020   [place],    TONSILLECTOMY      Family History  Problem Relation Age of Onset   Alpha-1 antitrypsin deficiency Mother    Alpha-1 antitrypsin deficiency Daughter    Colon cancer Neg Hx    Rectal cancer Neg Hx    Stomach cancer Neg Hx    Prostate cancer Neg Hx     Social History   Socioeconomic History   Marital status: Married    Spouse name: Not on file   Number of children:  3   Years of education: 14   Highest education level: Not on file  Occupational History   Occupation: Dietitian  Tobacco Use   Smoking status: Former    Packs/day: 0.25    Years: 2.00    Pack years: 0.50    Types: Cigarettes    Quit date: 03/17/1979    Years since quitting: 41.9   Smokeless tobacco: Never  Vaping Use   Vaping Use: Never used  Substance and Sexual Activity   Alcohol use: No    Alcohol/week: 0.0 standard drinks   Drug use: No   Sexual activity: Not on file  Other Topics Concern   Not on file  Social History Narrative   Lives with husband in a 2 story home.  Has 3 children.  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  Education: 2 years of college.   Social Determinants of Health   Financial Resource Strain: Not on file  Food Insecurity: Not on file  Transportation Needs: Not on file  Physical Activity: Not on file  Stress: Not on file  Social Connections: Not on file  Intimate Partner Violence: Not on file    Outpatient Medications Prior to Visit  Medication Sig Dispense Refill   albuterol (PROVENTIL) (2.5 MG/3ML) 0.083% nebulizer solution Take 3  mLs (2.5 mg total) by nebulization every 4 (four) hours as needed for wheezing or shortness of breath. 75 mL 6   diclofenac (VOLTAREN) 75 MG EC tablet Take 75 mg by mouth 2 (two) times daily.     dicyclomine (BENTYL) 10 MG capsule Take 1 capsule (10 mg total) by mouth 4 (four) times daily -  before meals and at bedtime. 120 capsule 11   Fluticasone-Umeclidin-Vilant (TRELEGY ELLIPTA) 100-62.5-25 MCG/INH AEPB Inhale 100 mcg into the lungs daily. 28 each 11   guaiFENesin (ROBITUSSIN) 100 MG/5ML liquid Take by mouth.     ipratropium-albuterol (DUONEB) 0.5-2.5 (3) MG/3ML SOLN Inhale into the lungs.     pantoprazole (PROTONIX) 40 MG tablet Take 1 tablet (40 mg total) by mouth 2 (two) times daily. 60 tablet 11   Simethicone (PHAZYME MAXIMUM STRENGTH PO) Take 500 mg by mouth 2 (two) times daily before a  meal.     Calcium Carbonate-Simethicone (ALKA-SELTZER HEARTBURN + GAS) 750-80 MG CHEW Chew 1 tablet by mouth as needed. (Patient not taking: Reported on 02/19/2021)     zolpidem (AMBIEN) 10 MG tablet Take 1 tablet (10 mg total) by mouth at bedtime. 30 tablet 5   No facility-administered medications prior to visit.    Allergies  Allergen Reactions   Demerol [Meperidine] Nausea Only    Review of Systems  Constitutional: Negative.   HENT: Negative.  Negative for sore throat.   Respiratory: Negative.    Cardiovascular: Negative.   Gastrointestinal:  Negative for abdominal pain.  Genitourinary:  Positive for vaginal discharge. Negative for decreased urine volume, dysuria, flank pain, hematuria, pelvic pain, urgency and vaginal pain.  All other systems reviewed and are negative.     Objective:    Physical Exam Vitals and nursing note reviewed.  Constitutional:      Appearance: Normal appearance.  HENT:     Head: Normocephalic.     Right Ear: Ear canal and external ear normal.     Left Ear: Ear canal and external ear normal.     Mouth/Throat:     Mouth: Mucous membranes are moist.     Pharynx: Oropharynx is clear.  Eyes:     Conjunctiva/sclera: Conjunctivae normal.  Cardiovascular:     Rate and Rhythm: Normal rate and regular rhythm.     Pulses: Normal pulses.     Heart sounds: Normal heart sounds.  Pulmonary:     Effort: Pulmonary effort is normal.     Breath sounds: Normal breath sounds.  Abdominal:     General: Bowel sounds are normal.     Tenderness: There is no right CVA tenderness or left CVA tenderness.  Musculoskeletal:        General: Normal range of motion.  Skin:    General: Skin is warm.     Findings: No rash.  Neurological:     Mental Status: She is alert and oriented to person, place, and time.  Psychiatric:        Mood and Affect: Mood normal.        Behavior: Behavior normal.    BP 138/78   Pulse 75   Temp (!) 97.1 F (36.2 C) (Temporal)   Ht 5'  7" (1.702 m)   Wt 186 lb 12.8 oz (84.7 kg)   SpO2 96%   BMI 29.26 kg/m  Wt Readings from Last 3 Encounters:  02/19/21 186 lb 12.8 oz (84.7 kg)  10/21/20 186 lb 4 oz (84.5 kg)  10/18/20 187 lb (84.8 kg)  Health Maintenance Due  Topic Date Due   Zoster Vaccines- Shingrix (1 of 2) Never done   MAMMOGRAM  Never done   COVID-19 Vaccine (3 - Moderna risk series) 07/12/2019   Pneumococcal Vaccine 27-16 Years old (3 - PPSV23 if available, else PCV20) 04/14/2021    There are no preventive care reminders to display for this patient.   Lab Results  Component Value Date   TSH 2.290 03/13/2020   Lab Results  Component Value Date   WBC 7.1 03/13/2020   HGB 12.0 03/13/2020   HCT 36.1 03/13/2020   MCV 94 03/13/2020   PLT 263 03/13/2020   Lab Results  Component Value Date   NA 140 03/13/2020   K 3.9 03/13/2020   CO2 27 03/13/2020   GLUCOSE 78 03/13/2020   BUN 13 03/13/2020   CREATININE 0.80 05/10/2020   BILITOT 0.2 03/13/2020   ALKPHOS 93 03/13/2020   AST 19 03/13/2020   ALT 21 03/13/2020   PROT 6.1 03/13/2020   ALBUMIN 4.4 03/13/2020   CALCIUM 9.1 03/13/2020   ANIONGAP 9 06/12/2015   Lab Results  Component Value Date   CHOL 178 03/13/2020   Lab Results  Component Value Date   HDL 60 03/13/2020   Lab Results  Component Value Date   LDLCALC 102 (H) 03/13/2020   Lab Results  Component Value Date   TRIG 87 03/13/2020   Lab Results  Component Value Date   CHOLHDL 3.0 03/13/2020   Lab Results  Component Value Date   HGBA1C 5.3 04/14/2016       Assessment & Plan:   Problem List Items Addressed This Visit       Genitourinary   Vaginal yeast infection - Primary    Vaginal yeast infection not well controlled.  Worsened by prolonged antibiotic treatment from hospital stay.  Completed wet prep results pending.  Fluconazole 150 mg tablet Rx sent to pharmacy.  Follow-up with unresolved symptoms.      Relevant Medications   fluconazole (DIFLUCAN) 150 MG  tablet   Other Relevant Orders   WET PREP FOR TRICH, YEAST, CLUE     Meds ordered this encounter  Medications   fluconazole (DIFLUCAN) 150 MG tablet    Sig: Take 1 tablet (150 mg total) by mouth once for 1 dose.    Dispense:  1 tablet    Refill:  0    Order Specific Question:   Supervising Provider    Answer:   Claretta Fraise [[id number]]      Ivy Lynn, NP

## 2021-02-20 ENCOUNTER — Ambulatory Visit: Payer: 59 | Admitting: Family Medicine

## 2021-02-21 ENCOUNTER — Ambulatory Visit (INDEPENDENT_AMBULATORY_CARE_PROVIDER_SITE_OTHER): Payer: 59 | Admitting: Family Medicine

## 2021-02-21 ENCOUNTER — Encounter: Payer: Self-pay | Admitting: Family Medicine

## 2021-02-21 ENCOUNTER — Other Ambulatory Visit: Payer: Self-pay | Admitting: Family Medicine

## 2021-02-21 VITALS — BP 128/64 | HR 91 | Temp 97.8°F | Ht 67.0 in | Wt 185.5 lb

## 2021-02-21 DIAGNOSIS — B370 Candidal stomatitis: Secondary | ICD-10-CM

## 2021-02-21 DIAGNOSIS — R0902 Hypoxemia: Secondary | ICD-10-CM

## 2021-02-21 DIAGNOSIS — J101 Influenza due to other identified influenza virus with other respiratory manifestations: Secondary | ICD-10-CM | POA: Diagnosis not present

## 2021-02-21 DIAGNOSIS — Z79899 Other long term (current) drug therapy: Secondary | ICD-10-CM

## 2021-02-21 DIAGNOSIS — J9601 Acute respiratory failure with hypoxia: Secondary | ICD-10-CM

## 2021-02-21 DIAGNOSIS — F5104 Psychophysiologic insomnia: Secondary | ICD-10-CM

## 2021-02-21 DIAGNOSIS — Z148 Genetic carrier of other disease: Secondary | ICD-10-CM | POA: Diagnosis not present

## 2021-02-21 DIAGNOSIS — N898 Other specified noninflammatory disorders of vagina: Secondary | ICD-10-CM

## 2021-02-21 DIAGNOSIS — E7800 Pure hypercholesterolemia, unspecified: Secondary | ICD-10-CM

## 2021-02-21 DIAGNOSIS — E8801 Alpha-1-antitrypsin deficiency: Secondary | ICD-10-CM | POA: Insufficient documentation

## 2021-02-21 DIAGNOSIS — Z09 Encounter for follow-up examination after completed treatment for conditions other than malignant neoplasm: Secondary | ICD-10-CM

## 2021-02-21 DIAGNOSIS — J441 Chronic obstructive pulmonary disease with (acute) exacerbation: Secondary | ICD-10-CM

## 2021-02-21 LAB — CBC WITH DIFFERENTIAL/PLATELET
Basophils Absolute: 0 10*3/uL (ref 0.0–0.2)
Basos: 0 %
EOS (ABSOLUTE): 0.2 10*3/uL (ref 0.0–0.4)
Eos: 3 %
Hematocrit: 39 % (ref 34.0–46.6)
Hemoglobin: 12.7 g/dL (ref 11.1–15.9)
Immature Grans (Abs): 0.1 10*3/uL (ref 0.0–0.1)
Immature Granulocytes: 1 %
Lymphocytes Absolute: 1.3 10*3/uL (ref 0.7–3.1)
Lymphs: 16 %
MCH: 30 pg (ref 26.6–33.0)
MCHC: 32.6 g/dL (ref 31.5–35.7)
MCV: 92 fL (ref 79–97)
Monocytes Absolute: 0.6 10*3/uL (ref 0.1–0.9)
Monocytes: 8 %
Neutrophils Absolute: 5.8 10*3/uL (ref 1.4–7.0)
Neutrophils: 72 %
Platelets: 309 10*3/uL (ref 150–450)
RBC: 4.23 x10E6/uL (ref 3.77–5.28)
RDW: 12.7 % (ref 11.7–15.4)
WBC: 8.1 10*3/uL (ref 3.4–10.8)

## 2021-02-21 LAB — CMP14+EGFR
ALT: 28 IU/L (ref 0–32)
AST: 20 IU/L (ref 0–40)
Albumin/Globulin Ratio: 2.4 — ABNORMAL HIGH (ref 1.2–2.2)
Albumin: 4.5 g/dL (ref 3.8–4.8)
Alkaline Phosphatase: 98 IU/L (ref 44–121)
BUN/Creatinine Ratio: 14 (ref 12–28)
BUN: 11 mg/dL (ref 8–27)
Bilirubin Total: 0.5 mg/dL (ref 0.0–1.2)
CO2: 28 mmol/L (ref 20–29)
Calcium: 9.2 mg/dL (ref 8.7–10.3)
Chloride: 101 mmol/L (ref 96–106)
Creatinine, Ser: 0.79 mg/dL (ref 0.57–1.00)
Globulin, Total: 1.9 g/dL (ref 1.5–4.5)
Glucose: 97 mg/dL (ref 70–99)
Potassium: 4.7 mmol/L (ref 3.5–5.2)
Sodium: 140 mmol/L (ref 134–144)
Total Protein: 6.4 g/dL (ref 6.0–8.5)
eGFR: 85 mL/min/{1.73_m2} (ref >59)

## 2021-02-21 LAB — LIPID PANEL
Chol/HDL Ratio: 4.8 ratio — ABNORMAL HIGH (ref 0.0–4.4)
Cholesterol, Total: 232 mg/dL — ABNORMAL HIGH (ref 100–199)
HDL: 48 mg/dL (ref >39)
LDL Chol Calc (NIH): 156 mg/dL — ABNORMAL HIGH (ref 0–99)
Triglycerides: 153 mg/dL — ABNORMAL HIGH (ref 0–149)
VLDL Cholesterol Cal: 28 mg/dL (ref 5–40)

## 2021-02-21 LAB — WET PREP FOR TRICH, YEAST, CLUE
Clue Cell Exam: NEGATIVE
Trichomonas Exam: NEGATIVE
Yeast Exam: NEGATIVE

## 2021-02-21 MED ORDER — ZOLPIDEM TARTRATE 10 MG PO TABS: 10.0000 mg | ORAL_TABLET | Freq: Every day | ORAL | 0 refills | Status: DC

## 2021-02-21 MED ORDER — ZINC OXIDE 20 % EX OINT: 1.0000 "application " | TOPICAL_OINTMENT | CUTANEOUS | 0 refills | Status: DC | PRN

## 2021-02-21 MED ORDER — NYSTATIN 100000 UNIT/ML MT SUSP: 5.0000 mL | Freq: Four times a day (QID) | OROMUCOSAL | 0 refills | Status: DC

## 2021-02-21 NOTE — Progress Notes (Signed)
Established Patient Office Visit  Subjective:  Patient ID: Carla Bell, female    DOB: 09-Dec-1960  Age: 60 y.o. MRN: [id number]  CC:  Chief Complaint  Patient presents with   Transitions Of Care    HPI Carla Bell presents for [facility].   Today's visit was for Transitional Care Management.  The patient was discharged from  on 121/2/22 with a primary diagnosis of acute respiratory failure with hypoxia.   Contact with the patient and/or caregiver, by a clinical staff member, was attempted on 02/17/21 x2 and was documented as a telephone encounter within the EMR.  Through chart review and discussion with the patient I have determined that management of their condition is of moderate complexity.   Carla Bell was admitted on 02/11/21 for acute respiratory failure with hypoxia and COPD exacerbation due to influenza A. She is an alpha-1 antitrypsin deficiency carrier. She was on supplement oxygen in the hospital at 2L [place]. She was treated with duonebs, prednisone, rocephin, and doxycyline. She was discharged home on room air with prednisone, cefdinir, and doxycycline. She has now completed these. She reports that her shortness of breath and cough is improving. She continues to do breathing treatments regularly with duo nebs and albuterol. She denies chest pain, fever or chills. She has not scheduled a follow up appointment with pulmonology.    She reports vulva irritation and swelling with white clumpy vaginal discharge. This has been going on since her hospitalization due to all the antibiotics she was given. She was treated with diflucan on 02/19/21 and had a negative wet prep at the time.   She also reports white bumps with soreness on her tongue.   Reports good control of insomnia with ambien. She is due to renew her CSA and complete UDS today.    Past Medical History:  Diagnosis Date   Alpha-1-antitrypsin deficiency ([place])    Asthma    Bowel obstruction ([place])    Celiac  disease    Colon polyps    COPD (chronic obstructive pulmonary disease) ([place])    Crohn's colitis ([place])    Gallstones    IBS (irritable bowel syndrome)    Ovarian cancer ([place]) 1990   Pericardial effusion 07/09/2015   Pleuritic chest pain 07/09/2015   Rotator cuff tear    left    Past Surgical History:  Procedure Laterality Date   ABDOMINAL HYSTERECTOMY     1989   BUNIONECTOMY WITH HAMMERTOE RECONSTRUCTION Right    CHOLECYSTECTOMY     COLONOSCOPY  04/25/2020   [place],    TONSILLECTOMY      Family History  Problem Relation Age of Onset   Alpha-1 antitrypsin deficiency Mother    Alpha-1 antitrypsin deficiency Daughter    Colon cancer Neg Hx    Rectal cancer Neg Hx    Stomach cancer Neg Hx    Prostate cancer Neg Hx     Social History   Socioeconomic History   Marital status: Married    Spouse name: Not on file   Number of children: 3   Years of education: 14   Highest education level: Not on file  Occupational History   Occupation: Dietitian  Tobacco Use   Smoking status: Former    Packs/day: 0.25    Years: 2.00    Pack years: 0.50    Types: Cigarettes    Quit date: 03/17/1979    Years since quitting: 41.9   Smokeless tobacco: Never  Vaping Use   Vaping Use:  Never used  Substance and Sexual Activity   Alcohol use: No    Alcohol/week: 0.0 standard drinks   Drug use: No   Sexual activity: Not on file  Other Topics Concern   Not on file  Social History Narrative   Lives with husband in a 2 story home.  Has 3 children.  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  Education: 2 years of college.   Social Determinants of Health   Financial Resource Strain: Not on file  Food Insecurity: Not on file  Transportation Needs: Not on file  Physical Activity: Not on file  Stress: Not on file  Social Connections: Not on file  Intimate Partner Violence: Not on file    Outpatient Medications Prior to Visit  Medication Sig Dispense Refill    albuterol (PROVENTIL) (2.5 MG/3ML) 0.083% nebulizer solution Take 3 mLs (2.5 mg total) by nebulization every 4 (four) hours as needed for wheezing or shortness of breath. 75 mL 6   dicyclomine (BENTYL) 10 MG capsule Take 1 capsule (10 mg total) by mouth 4 (four) times daily -  before meals and at bedtime. 120 capsule 11   fluconazole (DIFLUCAN) 150 MG tablet Take 150 mg by mouth once.     Fluticasone-Umeclidin-Vilant (TRELEGY ELLIPTA) 100-62.5-25 MCG/INH AEPB Inhale 100 mcg into the lungs daily. 28 each 11   ipratropium-albuterol (DUONEB) 0.5-2.5 (3) MG/3ML SOLN Inhale into the lungs.     pantoprazole (PROTONIX) 40 MG tablet Take 1 tablet (40 mg total) by mouth 2 (two) times daily. 60 tablet 11   zolpidem (AMBIEN) 10 MG tablet Take 1 tablet (10 mg total) by mouth at bedtime. 30 tablet 5   Calcium Carbonate-Simethicone (ALKA-SELTZER HEARTBURN + GAS) 750-80 MG CHEW Chew 1 tablet by mouth as needed. (Patient not taking: Reported on 02/19/2021)     diclofenac (VOLTAREN) 75 MG EC tablet Take 75 mg by mouth 2 (two) times daily.     guaiFENesin (ROBITUSSIN) 100 MG/5ML liquid Take by mouth.     Simethicone (PHAZYME MAXIMUM STRENGTH PO) Take 500 mg by mouth 2 (two) times daily before a meal.     No facility-administered medications prior to visit.    Allergies  Allergen Reactions   Demerol [Meperidine] Nausea Only    ROS Review of Systems Negative unless specially indicated above in HPI.   Objective:    Physical Exam Vitals and nursing note reviewed.  Constitutional:      General: She is not in acute distress.    Appearance: She is not ill-appearing, toxic-appearing or diaphoretic.  HENT:     Head: Normocephalic and atraumatic.     Mouth/Throat:     Mouth: Mucous membranes are moist.     Pharynx: No posterior oropharyngeal erythema.     Comments: Oral thrust present.  Cardiovascular:     Rate and Rhythm: Normal rate and regular rhythm.     Heart sounds: Normal heart sounds. No murmur  heard. Pulmonary:     Effort: Pulmonary effort is normal. No respiratory distress.     Breath sounds: Normal breath sounds.  Abdominal:     General: Bowel sounds are normal. There is no distension.     Palpations: Abdomen is soft.     Tenderness: There is no abdominal tenderness. There is no guarding or rebound.  Musculoskeletal:     Right lower leg: No edema.     Left lower leg: No edema.  Skin:    General: Skin is warm and dry.  Neurological:     General: No focal deficit present.     Mental Status: She is alert and oriented to person, place, and time.  Psychiatric:        Mood and Affect: Mood normal.        Behavior: Behavior normal.   Microscopic wet-mount exam shows negative for pathogens, normal epithelial cells.  BP 128/64   Pulse 91   Temp 97.8 F (36.6 C) (Temporal)   Ht _0  (1.702 m)   Wt 185 lb 8 oz (84.1 kg)   BMI 29.05 kg/m  Wt Readings from Last 3 Encounters:  02/21/21 185 lb 8 oz (84.1 kg)  02/19/21 186 lb 12.8 oz (84.7 kg)  10/21/20 186 lb 4 oz (84.5 kg)     Health Maintenance Due  Topic Date Due   MAMMOGRAM  Never done   Pneumococcal Vaccine 13-70 Years old (3 - PPSV23 if available, else PCV20) 04/14/2021    There are no preventive care reminders to display for this patient.  Lab Results  Component Value Date   TSH 2.290 03/13/2020   Lab Results  Component Value Date   WBC 7.1 03/13/2020   HGB 12.0 03/13/2020   HCT 36.1 03/13/2020   MCV 94 03/13/2020   PLT 263 03/13/2020   Lab Results  Component Value Date   NA 140 03/13/2020   K 3.9 03/13/2020   CO2 27 03/13/2020   GLUCOSE 78 03/13/2020   BUN 13 03/13/2020   CREATININE 0.80 05/10/2020   BILITOT 0.2 03/13/2020   ALKPHOS 93 03/13/2020   AST 19 03/13/2020   ALT 21 03/13/2020   PROT 6.1 03/13/2020   ALBUMIN 4.4 03/13/2020   CALCIUM 9.1 03/13/2020   ANIONGAP 9 06/12/2015   Lab Results  Component Value Date   CHOL 178 03/13/2020   Lab Results  Component Value Date   HDL 60  03/13/2020   Lab Results  Component Value Date   LDLCALC 102 (H) 03/13/2020   Lab Results  Component Value Date   TRIG 87 03/13/2020   Lab Results  Component Value Date   CHOLHDL 3.0 03/13/2020   Lab Results  Component Value Date   HGBA1C 5.3 04/14/2016      Assessment & Plan:   Carla Bell was seen today for transitions of care.  Diagnoses and all orders for this visit:  Influenza A Hypoxia Acute respiratory failure with hypoxia ([place]) COPD exacerbation ([facility]) Alpha-1-antitrypsin deficiency carrier Treated with supplemental oxygen, antibiotics, and steroids. Reports afebrile with improving shortness of breath and cough today. Lungs clear on exam today. Discussed follow up with pulmonology. Continue duo neb and albuterol prn. Continue trelegy. Labs pending.  -     CBC with Differential/Platelet -     CMP14+EGFR  Pure hypercholesterolemia Diet and exercise. Labs pending.  -     Lipid panel  Chronic insomnia Controlled substance agreement signed Well controlled on current regimen. PDMP reviewed, no red flags. Renewed CSA today. UDS pending. Will send in additional refills pending UDS results.  -     ToxASSURE Select 13 (MW), Urine -     zolpidem (AMBIEN) 10 MG tablet; Take 1 tablet (10 mg total) by mouth at bedtime.  Vaginal irritation Negative wet prep. Zinc oxide sent in for irritation.  -     WET PREP FOR TRICH, YEAST, CLUE  Thrush, oral Nystatin oral has been sent in.   Hospital discharge follow-up Reviewed hospital records.   Follow-up: Return in about 6 months (around 08/22/2021)  for chronic follow up.   The patient indicates understanding of these issues and agrees with the plan.  Gwenlyn Perking, FNP

## 2021-02-21 NOTE — Patient Instructions (Signed)

## 2021-02-28 LAB — TOXASSURE SELECT 13 (MW), URINE

## 2021-03-03 ENCOUNTER — Other Ambulatory Visit: Payer: Self-pay | Admitting: Family Medicine

## 2021-03-03 DIAGNOSIS — Z9114 Patient's other noncompliance with medication regimen: Secondary | ICD-10-CM

## 2021-03-03 DIAGNOSIS — Z91148 Patient's other noncompliance with medication regimen for other reason: Secondary | ICD-10-CM

## 2021-03-03 DIAGNOSIS — F5104 Psychophysiologic insomnia: Secondary | ICD-10-CM

## 2021-03-03 MED ORDER — ZOLPIDEM TARTRATE 5 MG PO TABS: 5.0000 mg | ORAL_TABLET | Freq: Every evening | ORAL | 0 refills | Status: DC | PRN

## 2021-06-24 ENCOUNTER — Ambulatory Visit (INDEPENDENT_AMBULATORY_CARE_PROVIDER_SITE_OTHER): Payer: 59 | Admitting: Family Medicine

## 2021-06-24 ENCOUNTER — Ambulatory Visit (INDEPENDENT_AMBULATORY_CARE_PROVIDER_SITE_OTHER): Payer: 59

## 2021-06-24 VITALS — BP 128/71 | HR 71 | Wt 184.0 lb

## 2021-06-24 DIAGNOSIS — G8929 Other chronic pain: Secondary | ICD-10-CM | POA: Diagnosis not present

## 2021-06-24 DIAGNOSIS — F321 Major depressive disorder, single episode, moderate: Secondary | ICD-10-CM

## 2021-06-24 DIAGNOSIS — F5104 Psychophysiologic insomnia: Secondary | ICD-10-CM

## 2021-06-24 DIAGNOSIS — M5441 Lumbago with sciatica, right side: Secondary | ICD-10-CM | POA: Diagnosis not present

## 2021-06-24 DIAGNOSIS — F419 Anxiety disorder, unspecified: Secondary | ICD-10-CM

## 2021-06-24 DIAGNOSIS — Z1231 Encounter for screening mammogram for malignant neoplasm of breast: Secondary | ICD-10-CM

## 2021-06-24 DIAGNOSIS — G2581 Restless legs syndrome: Secondary | ICD-10-CM

## 2021-06-24 MED ORDER — MELOXICAM 15 MG PO TABS: 15.0000 mg | ORAL_TABLET | Freq: Every day | ORAL | 1 refills | Status: DC

## 2021-06-24 MED ORDER — CITALOPRAM HYDROBROMIDE 20 MG PO TABS: 20.0000 mg | ORAL_TABLET | Freq: Every day | ORAL | 3 refills | Status: DC

## 2021-06-24 MED ORDER — GABAPENTIN 300 MG PO CAPS: ORAL_CAPSULE | ORAL | 1 refills | Status: DC

## 2021-06-24 NOTE — Progress Notes (Signed)
? ?Established Patient Office Visit ? ?Subjective:  ?Patient ID: Carla Bell, female    DOB: [birth year]  Age: 61 y.o. MRN: [id number] ? ?CC:  ?Chief Complaint  ?Patient presents with  ? Insomnia  ? Back Pain  ? ? ?HPI ?Carla Bell presents for insomnia and back pain. Both are chronic. She was previously taking ambien for insomnia. This was helpful but she did break the CSA and has been weaned off [place] as a results. She has also failed trazodone and melatonin in the past. She reports that she was still sleeping well after discontinuing ambien when her stress levels were low. She has recently gotten custody of her 51 year old granddaughter. Since then she has been overwhelmed and stressed out trying to manage everything that needs to be done. Now she is having trouble falling asleep and staying asleep. She also reports pins and needles like feelings in her legs at night and this causes her to constantly feel the ned to move this. She seemed to started recently after starting to go to the gym regularly. She also reports that her back pain has been terrible since going to the gym. She has a history of DDD in her lower back that was found on an MRI in 2012. The pain is mostly on her right lower back and radiates down her right leg. The pain is worse with bending and activity. Also worse with rotation of her hip. She denies saddles anesthesia, changes in bowel or bladder control, or falls. Denies injury. She has been taking tylenol with out improvement. Reports that years ago she went to a pain clinic for her back pain and was on subutex which did not help. She does not wish to go this route again.  ? ? ?  06/24/2021  ?  3:03 PM 02/21/2021  ?  8:37 AM 02/19/2021  ?  2:43 PM  ?Depression screen PHQ 2/9  ?Decreased Interest 0 0 0  ?Down, Depressed, Hopeless 0 0 0  ?PHQ - 2 Score 0 0 0  ?Altered sleeping 3 0 1  ?Tired, decreased energy 3 1 0  ?Change in appetite 3 0 0  ?Feeling bad or failure about yourself   1 0 0  ?Trouble concentrating 0 0 0  ?Moving slowly or fidgety/restless 0 0 0  ?Suicidal thoughts 0 0 0  ?PHQ-9 Score _0 ?Difficult doing work/chores  Not difficult at all Not difficult at all  ? ? ?  06/24/2021  ?  3:03 PM 02/21/2021  ?  8:38 AM 02/19/2021  ?  2:43 PM 09/26/2020  ?  2:38 PM  ?GAD 7 : Generalized Anxiety Score  ?Nervous, Anxious, on Edge 2 0 0 0  ?Control/stop worrying 2 0 0 0  ?Worry too much - different things 2 0 0 0  ?Trouble relaxing 2 0 0 0  ?Restless 2 0 0 0  ?Easily annoyed or irritable 2 0 0 0  ?Afraid - awful might happen 0 0 0 0  ?Total GAD 7 Score 12 0 0 0  ?Anxiety Difficulty  Not difficult at all    ? ? ?Past Medical History:  ?Diagnosis Date  ? Alpha-1-antitrypsin deficiency ([place])   ? Asthma   ? Bowel obstruction ([place])   ? Celiac disease   ? Colon polyps   ? COPD (chronic obstructive pulmonary disease) ([place])   ? Crohn's colitis ([place])   ? Gallstones   ? IBS (irritable bowel syndrome)   ? Ovarian  cancer ([place]) 1990  ? Pericardial effusion 07/09/2015  ? Pleuritic chest pain 07/09/2015  ? Rotator cuff tear   ? left  ? ? ?Past Surgical History:  ?Procedure Laterality Date  ? ABDOMINAL HYSTERECTOMY    ? 1989  ? BUNIONECTOMY WITH HAMMERTOE RECONSTRUCTION Right   ? CHOLECYSTECTOMY    ? COLONOSCOPY  04/25/2020  ? [place],   ? TONSILLECTOMY    ? ? ?Family History  ?Problem Relation Age of Onset  ? Alpha-1 antitrypsin deficiency Mother   ? Alpha-1 antitrypsin deficiency Daughter   ? Colon cancer Neg Hx   ? Rectal cancer Neg Hx   ? Stomach cancer Neg Hx   ? Prostate cancer Neg Hx   ? ? ?Social History  ? ?Socioeconomic History  ? Marital status: Married  ?  Spouse name: Not on file  ? Number of children: 3  ? Years of education: 17  ? Highest education level: Not on file  ?Occupational History  ? Occupation: Dietitian  ?Tobacco Use  ? Smoking status: Former  ?  Packs/day: 0.25  ?  Years: 2.00  ?  Pack years: 0.50  ?  Types: Cigarettes  ?  Quit date: 03/17/1979  ?  Years since  quitting: 42.3  ? Smokeless tobacco: Never  ?Vaping Use  ? Vaping Use: Never used  ?Substance and Sexual Activity  ? Alcohol use: No  ?  Alcohol/week: 0.0 standard drinks  ? Drug use: No  ? Sexual activity: Not on file  ?Other Topics Concern  ? Not on file  ?Social History Narrative  ? Lives with husband in a 2 story home.  Has 3 children.  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  Education: 2 years of college.  ? ?Social Determinants of Health  ? ?Financial Resource Strain: Not on file  ?Food Insecurity: Not on file  ?Transportation Needs: Not on file  ?Physical Activity: Not on file  ?Stress: Not on file  ?Social Connections: Not on file  ?Intimate Partner Violence: Not on file  ? ? ?Outpatient Medications Prior to Visit  ?Medication Sig Dispense Refill  ? albuterol (PROVENTIL) (2.5 MG/3ML) 0.083% nebulizer solution Take 3 mLs (2.5 mg total) by nebulization every 4 (four) hours as needed for wheezing or shortness of breath. 75 mL 6  ? dicyclomine (BENTYL) 10 MG capsule Take 1 capsule (10 mg total) by mouth 4 (four) times daily -  before meals and at bedtime. 120 capsule 11  ? fluconazole (DIFLUCAN) 150 MG tablet Take 150 mg by mouth once.    ? Fluticasone-Umeclidin-Vilant (TRELEGY ELLIPTA) 100-62.5-25 MCG/INH AEPB Inhale 100 mcg into the lungs daily. 28 each 11  ? nystatin (MYCOSTATIN) 100000 UNIT/ML suspension Take 5 mLs (500,000 Units total) by mouth 4 (four) times daily. 60 mL 0  ? pantoprazole (PROTONIX) 40 MG tablet Take 1 tablet (40 mg total) by mouth 2 (two) times daily. 60 tablet 11  ? zinc oxide 20 % ointment Apply 1 application topically as needed for irritation. 56.7 g 0  ? zolpidem (AMBIEN) 5 MG tablet Take 1 tablet (5 mg total) by mouth at bedtime as needed for sleep. 21 tablet 0  ? ?No facility-administered medications prior to visit.  ? ? ?Allergies  ?Allergen Reactions  ? Demerol [Meperidine] Nausea Only  ? ? ?ROS ?Review of Systems ?As per HPI.  ?  ?Objective:  ?  ?Physical  Exam ?Vitals and nursing note reviewed.  ?Constitutional:   ?   General:  She is not in acute distress. ?   Appearance: She is not ill-appearing, toxic-appearing or diaphoretic.  ?HENT:  ?   Mouth/Throat:  ?   Mouth: Mucous membranes are moist.  ?   Pharynx: Oropharynx is clear.  ?Cardiovascular:  ?   Rate and Rhythm: Normal rate and regular rhythm.  ?   Heart sounds: Normal heart sounds. No murmur heard. ?Pulmonary:  ?   Effort: Pulmonary effort is normal. No respiratory distress.  ?   Breath sounds: Normal breath sounds.  ?Musculoskeletal:     ?   General: No signs of injury.  ?   Lumbar back: Tenderness (Right SI joint) present. No swelling, edema, deformity or signs of trauma. Positive right straight leg raise test.  ?   Right lower leg: No edema.  ?   Left lower leg: No edema.  ?Skin: ?   General: Skin is warm and dry.  ?Neurological:  ?   General: No focal deficit present.  ?   Mental Status: She is alert and oriented to person, place, and time.  ?   Motor: No weakness.  ?   Gait: Gait normal.  ?Psychiatric:     ?   Mood and Affect: Mood normal.     ?   Behavior: Behavior normal.     ?   Thought Content: Thought content normal.     ?   Judgment: Judgment normal.  ? ? ?There were no vitals taken for this visit. ?Wt Readings from Last 3 Encounters:  ?02/21/21 185 lb 8 oz (84.1 kg)  ?02/19/21 186 lb 12.8 oz (84.7 kg)  ?10/21/20 186 lb 4 oz (84.5 kg)  ? ? ? ?Health Maintenance Due  ?Topic Date Due  ? Zoster Vaccines- Shingrix (1 of 2) Never done  ? MAMMOGRAM  Never done  ? ? ?There are no preventive care reminders to display for this patient. ? ?Lab Results  ?Component Value Date  ? TSH 2.290 03/13/2020  ? ?Lab Results  ?Component Value Date  ? WBC 8.1 02/21/2021  ? HGB 12.7 02/21/2021  ? HCT 39.0 02/21/2021  ? MCV 92 02/21/2021  ? PLT 309 02/21/2021  ? ?Lab Results  ?Component Value Date  ? NA 140 02/21/2021  ? K 4.7 02/21/2021  ? CO2 28 02/21/2021  ? GLUCOSE 97 02/21/2021  ? BUN 11 02/21/2021  ? CREATININE 0.79  02/21/2021  ? BILITOT 0.5 02/21/2021  ? ALKPHOS 98 02/21/2021  ? AST 20 02/21/2021  ? ALT 28 02/21/2021  ? PROT 6.4 02/21/2021  ? ALBUMIN 4.5 02/21/2021  ? CALCIUM 9.2 02/21/2021  ? ANIONGAP 9 06/12/2015  ? EGFR 85 1

## 2021-06-24 NOTE — Patient Instructions (Signed)
Restless Legs Syndrome ?Restless legs syndrome is a condition that causes uncomfortable feelings or sensations in the legs, especially while sitting or lying down. The sensations usually cause an overwhelming urge to move the legs. The arms can also sometimes be affected. ?The condition can range from mild to severe. The symptoms often interfere with a person's ability to sleep. ?What are the causes? ?The cause of this condition is not known. ?What increases the risk? ?The following factors may make you more likely to develop this condition: ?Being older than 50. ?Pregnancy. ?Being a woman. In general, the condition is more common in women than in men. ?A family history of the condition. ?Having iron deficiency. ?Overuse of caffeine, nicotine, or alcohol. ?Certain medical conditions, such as kidney disease, Parkinson's disease, or nerve damage. ?Certain medicines, such as those for high blood pressure, nausea, colds, allergies, depression, and some heart conditions. ?What are the signs or symptoms? ?The main symptom of this condition is uncomfortable sensations in the legs, such as: ?Pulling. ?Tingling. ?Prickling. ?Throbbing. ?Crawling. ?Burning. ?Usually, the sensations: ?Affect both sides of the body. ?Are worse when you sit or lie down. ?Are worse at night. These may make it difficult to fall asleep. ?Make you have a strong urge to move your legs. ?Are temporarily relieved by moving your legs or standing. ?The arms can also be affected, but this is rare. People who have this condition often have tiredness during the day because of their lack of sleep at night. ?How is this diagnosed? ?This condition may be diagnosed based on: ?Your symptoms. ?Blood tests. ?In some cases, you may be monitored in a sleep lab by a specialist (a sleep study). This can detect any disruptions in your sleep. ?How is this treated? ?This condition is treated by managing the symptoms. This may include: ?Lifestyle changes, such as  exercising, using relaxation techniques, and avoiding caffeine, alcohol, or tobacco. ?Iron supplements. ?Medicines. Parkinson's medications may be tried first. Anti-seizure medications can also be helpful. ?Follow these instructions at home: ?General instructions ?Take over-the-counter and prescription medicines only as told by your health care provider. ?Use methods to help relieve the uncomfortable sensations, such as: ?Massaging your legs. ?Walking or stretching. ?Taking a cold or hot bath. ?Keep all follow-up visits. This is important. ?Lifestyle ?  ?Practice good sleep habits. For example, go to bed and get up at the same time every day. Most adults should get 7-9 hours of sleep each night. ?Exercise regularly. Try to get at least 30 minutes of exercise most days of the week. ?Practice ways of relaxing, such as yoga or meditation. ?Avoid caffeine and alcohol. ?Do not use any products that contain nicotine or tobacco. These products include cigarettes, chewing tobacco, and vaping devices, such as e-cigarettes. If you need help quitting, ask your health care provider. ?Where to find more information ?National Institute of Neurological Disorders and Stroke: www.ninds.nih.gov ?Contact a health care provider if: ?Your symptoms get worse or they do not improve with treatment. ?Summary ?Restless legs syndrome is a condition that causes uncomfortable feelings or sensations in the legs, especially while sitting or lying down. ?The symptoms often interfere with your ability to sleep. ?This condition is treated by managing the symptoms. You may need to make lifestyle changes or take medicines. ?This information is not intended to replace advice given to you by your health care provider. Make sure you discuss any questions you have with your health care provider. ?Document Revised: 10/13/2020 Document Reviewed: 10/13/2020 ?Elsevier Patient Education ? 2022   Elsevier Inc. ? ?

## 2021-06-25 ENCOUNTER — Encounter: Payer: Self-pay | Admitting: Family Medicine

## 2021-07-23 ENCOUNTER — Encounter: Payer: Self-pay | Admitting: Family Medicine

## 2021-07-23 ENCOUNTER — Ambulatory Visit: Payer: 59 | Admitting: Family Medicine

## 2021-08-20 ENCOUNTER — Ambulatory Visit: Payer: 59 | Admitting: Family Medicine

## 2021-08-21 ENCOUNTER — Other Ambulatory Visit: Payer: Self-pay | Admitting: Family Medicine

## 2021-08-21 ENCOUNTER — Encounter: Payer: Self-pay | Admitting: Family Medicine

## 2021-08-21 DIAGNOSIS — G2581 Restless legs syndrome: Secondary | ICD-10-CM

## 2021-08-21 DIAGNOSIS — G8929 Other chronic pain: Secondary | ICD-10-CM

## 2021-10-06 ENCOUNTER — Other Ambulatory Visit: Payer: Self-pay | Admitting: Family Medicine

## 2021-10-06 DIAGNOSIS — G2581 Restless legs syndrome: Secondary | ICD-10-CM

## 2021-10-06 DIAGNOSIS — G8929 Other chronic pain: Secondary | ICD-10-CM

## 2021-10-06 NOTE — Telephone Encounter (Signed)
[facility] coverage  Last office visit 06/24/21 Last refill Gabapentin 08/22/21, #60 no refills Meloxicam 08/22/21, #30 no refills

## 2021-10-08 ENCOUNTER — Ambulatory Visit (INDEPENDENT_AMBULATORY_CARE_PROVIDER_SITE_OTHER): Payer: 59 | Admitting: Family Medicine

## 2021-10-08 ENCOUNTER — Encounter: Payer: Self-pay | Admitting: Family Medicine

## 2021-10-08 VITALS — BP 135/73 | HR 76 | Temp 97.8°F | Ht 67.0 in | Wt 177.5 lb

## 2021-10-08 DIAGNOSIS — F419 Anxiety disorder, unspecified: Secondary | ICD-10-CM | POA: Diagnosis not present

## 2021-10-08 DIAGNOSIS — G2581 Restless legs syndrome: Secondary | ICD-10-CM

## 2021-10-08 DIAGNOSIS — M5441 Lumbago with sciatica, right side: Secondary | ICD-10-CM

## 2021-10-08 DIAGNOSIS — F321 Major depressive disorder, single episode, moderate: Secondary | ICD-10-CM | POA: Diagnosis not present

## 2021-10-08 DIAGNOSIS — G8929 Other chronic pain: Secondary | ICD-10-CM

## 2021-10-08 MED ORDER — GABAPENTIN 300 MG PO CAPS: 600.0000 mg | ORAL_CAPSULE | Freq: Every day | ORAL | 3 refills | Status: AC

## 2021-10-08 MED ORDER — GABAPENTIN 300 MG PO CAPS: 600.0000 mg | ORAL_CAPSULE | Freq: Every day | ORAL | 3 refills | Status: DC

## 2021-10-08 MED ORDER — CITALOPRAM HYDROBROMIDE 20 MG PO TABS: 20.0000 mg | ORAL_TABLET | Freq: Every day | ORAL | 3 refills | Status: AC

## 2021-10-08 MED ORDER — MELOXICAM 15 MG PO TABS: 15.0000 mg | ORAL_TABLET | Freq: Every day | ORAL | 3 refills | Status: DC

## 2021-10-08 NOTE — Patient Instructions (Signed)
Sciatica  Sciatica is pain, numbness, weakness, or tingling along the path of the sciatic nerve. The sciatic nerve starts in the lower back and runs down the back of each leg. The nerve controls the muscles in the lower leg and in the back of the knee. It also provides feeling (sensation) to the back of the thigh, the lower leg, and the sole of the foot. Sciatica is a symptom of another medical condition that pinches or puts pressure on the sciatic nerve. Sciatica most often only affects one side of the body. Sciatica usually goes away on its own or with treatment. In some cases, sciatica may come back (recur). What are the causes? This condition is caused by pressure on the sciatic nerve or pinching of the nerve. This may be the result of: A disk in between the bones of the spine bulging out too far (herniated disk). Age-related changes in the spinal disks. A pain disorder that affects a muscle in the buttock. Extra bone growth near the sciatic nerve. A break (fracture) of the pelvis. Pregnancy. Tumor. This is rare. What increases the risk? The following factors may make you more likely to develop this condition: Playing sports that place pressure or stress on the spine. Having poor strength and flexibility. A history of back injury or surgery. Sitting for long periods of time. Doing activities that involve repetitive bending or lifting. Obesity. What are the signs or symptoms? Symptoms can vary from mild to very severe, and they may include: Any of these problems in the lower back, leg, hip, or buttock: Mild tingling, numbness, or dull aches. Burning sensations. Sharp pains. Numbness in the back of the calf or the sole of the foot. Leg weakness. Severe back pain that makes movement difficult. Symptoms may get worse when you cough, sneeze, or laugh, or when you sit or stand for long periods of time. How is this diagnosed? This condition may be diagnosed based on: Your symptoms and  medical history. A physical exam. Blood tests. Imaging tests, such as: X-rays. MRI. CT scan. How is this treated? In many cases, this condition improves on its own without treatment. However, treatment may include: Reducing or modifying physical activity. Exercising and stretching. Icing and applying heat to the affected area. Medicines that help to: Relieve pain and swelling. Relax your muscles. Injections of medicines that help to relieve pain, irritation, and inflammation around the sciatic nerve (steroids). Surgery. Follow these instructions at home: Medicines Take over-the-counter and prescription medicines only as told by your health care provider. Ask your health care provider if the medicine prescribed to you: Requires you to avoid driving or using heavy machinery. Can cause constipation. You may need to take these actions to prevent or treat constipation: Drink enough fluid to keep your urine pale yellow. Take over-the-counter or prescription medicines. Eat foods that are high in fiber, such as beans, whole grains, and fresh fruits and vegetables. Limit foods that are high in fat and processed sugars, such as fried or sweet foods. Managing pain     If directed, put ice on the affected area. Put ice in a plastic bag. Place a towel between your skin and the bag. Leave the ice on for 20 minutes, 2-3 times a day. If directed, apply heat to the affected area. Use the heat source that your health care provider recommends, such as a moist heat pack or a heating pad. Place a towel between your skin and the heat source. Leave the heat on for  20-30 minutes. Remove the heat if your skin turns bright red. This is especially important if you are unable to feel pain, heat, or cold. You may have a greater risk of getting burned. Activity  Return to your normal activities as told by your health care provider. Ask your health care provider what activities are safe for you. Avoid  activities that make your symptoms worse. Take brief periods of rest throughout the day. When you rest for longer periods, mix in some mild activity or stretching between periods of rest. This will help to prevent stiffness and pain. Avoid sitting for long periods of time without moving. Get up and move around at least one time each hour. Exercise and stretch regularly, as told by your health care provider. Do not lift anything that is heavier than 10 lb (4.5 kg) while you have symptoms of sciatica. When you do not have symptoms, you should still avoid heavy lifting, especially repetitive heavy lifting. When you lift objects, always use proper lifting technique, which includes: Bending your knees. Keeping the load close to your body. Avoiding twisting. General instructions Maintain a healthy weight. Excess weight puts extra stress on your back. Wear supportive, comfortable shoes. Avoid wearing high heels. Avoid sleeping on a mattress that is too soft or too hard. A mattress that is firm enough to support your back when you sleep may help to reduce your pain. Keep all follow-up visits as told by your health care provider. This is important. Contact a health care provider if: You have pain that: Wakes you up when you are sleeping. Gets worse when you lie down. Is worse than you have experienced in the past. Lasts longer than 4 weeks. You have an unexplained weight loss. Get help right away if: You are not able to control when you urinate or have bowel movements (incontinence). You have: Weakness in your lower back, pelvis, buttocks, or legs that gets worse. Redness or swelling of your back. A burning sensation when you urinate. Summary Sciatica is pain, numbness, weakness, or tingling along the path of the sciatic nerve. This condition is caused by pressure on the sciatic nerve or pinching of the nerve. Sciatica can cause pain, numbness, or tingling in the lower back, legs, hips, and  buttocks. Treatment often includes rest, exercise, medicines, and applying ice or heat. This information is not intended to replace advice given to you by your health care provider. Make sure you discuss any questions you have with your health care provider. Document Revised: 03/21/2018 Document Reviewed: 03/21/2018 Elsevier Patient Education  [place].

## 2021-10-08 NOTE — Progress Notes (Signed)
Established Patient Office Visit  Subjective   Patient ID: Carla Bell, female    DOB: 1960/12/07  Age: 61 y.o. MRN: [id number]  Chief Complaint  Patient presents with   Peripheral Neuropathy    HPI Carla Bell is here for a follow up of chronic lower back pain with right sided sciatica and peripheral neuropathy. She reports that symptoms are stable. She reports doing well with gabapentin at night. She is not able to take gabapentin during the day as it makes her feel a little drowsy. She denies changes in bowel or bladder control or saddle anesthesia. She takes mobic prn with good relief as well. She has been neurology in the past and had nerve conduction testing done that showed peripheral neuropathy. Gabapentin has also worked well to control her RLS.   She has been taking celexa daily. She denies side effects and reports that this has helped her anxiety significantly. She feels much less irritable now.      10/08/2021    1:18 PM 06/24/2021    3:03 PM 02/21/2021    8:37 AM  Depression screen PHQ 2/9  Decreased Interest 0 0 0  Down, Depressed, Hopeless 0 0 0  PHQ - 2 Score 0 0 0  Altered sleeping 0 3 0  Tired, decreased energy '1 3 1  '$ Change in appetite 1 3 0  Feeling bad or failure about yourself  0 1 0  Trouble concentrating 0 0 0  Moving slowly or fidgety/restless 0 0 0  Suicidal thoughts 0 0 0  PHQ-9 Score '2 10 1  '$ Difficult doing work/chores Not difficult at all  Not difficult at all      10/08/2021    1:18 PM 06/24/2021    3:03 PM 02/21/2021    8:38 AM 02/19/2021    2:43 PM  GAD 7 : Generalized Anxiety Score  Nervous, Anxious, on Edge 1 2 0 0  Control/stop worrying 0 2 0 0  Worry too much - different things 1 2 0 0  Trouble relaxing 1 2 0 0  Restless 1 2 0 0  Easily annoyed or irritable 0 2 0 0  Afraid - awful might happen 0 0 0 0  Total GAD 7 Score 4 12 0 0  Anxiety Difficulty Not difficult at all  Not difficult at all       Past Medical History:   Diagnosis Date   Alpha-1-antitrypsin deficiency ([place])    Asthma    Bowel obstruction ([facility])    Celiac disease    Colon polyps    COPD (chronic obstructive pulmonary disease) ([place])    Crohn's colitis ([place])    Gallstones    IBS (irritable bowel syndrome)    Ovarian cancer ([place]) 1990   Pericardial effusion 07/09/2015   Pleuritic chest pain 07/09/2015   Rotator cuff tear    left      ROS As per HPI.    Objective:     BP 135/73   Pulse 76   Temp 97.8 F (36.6 C) (Temporal)   Ht '5\' 7"'$  (1.702 m)   Wt 177 lb 8 oz (80.5 kg)   SpO2 95%   BMI 27.80 kg/m    Physical Exam Vitals and nursing note reviewed.  Constitutional:      General: She is not in acute distress.    Appearance: She is not ill-appearing, toxic-appearing or diaphoretic.  Eyes:     Extraocular Movements: Extraocular movements intact.     Pupils: Pupils are  equal, round, and reactive to light.  Cardiovascular:     Rate and Rhythm: Normal rate and regular rhythm.     Heart sounds: Normal heart sounds. No murmur heard. Pulmonary:     Effort: Pulmonary effort is normal. No respiratory distress.     Breath sounds: Normal breath sounds.  Musculoskeletal:     Right lower leg: No edema.     Left lower leg: No edema.  Skin:    General: Skin is warm and dry.  Neurological:     General: No focal deficit present.     Mental Status: She is alert and oriented to person, place, and time.     Cranial Nerves: No cranial nerve deficit.     Motor: No weakness.     Gait: Gait normal.  Psychiatric:        Mood and Affect: Mood normal.        Behavior: Behavior normal.      No results found for any visits on 10/08/21.    The 10-year ASCVD risk score (Arnett DK, et al., 2019) is: 5.4%    Assessment & Plan:   Carla Bell was seen today for peripheral neuropathy.  Diagnoses and all orders for this visit:  Chronic right-sided low back pain with right-sided sciatica Well controlled on current regimen.  -      gabapentin (NEURONTIN) 300 MG capsule; Take 2 capsules (600 mg total) by mouth at bedtime.  -     meloxicam (MOBIC) 15 MG tablet; Take 1 tablet (15 mg total) by mouth daily.  Restless leg syndrome -     gabapentin (NEURONTIN) 300 MG capsule; Take 2 capsules (600 mg total) by mouth at bedtime.   Depression, major, single episode, moderate ([facility]) Anxiety Well controlled on current regimen.  -     citalopram (CELEXA) 20 MG tablet; Take 1 tablet (20 mg total) by mouth daily.  Return in about 3 months (around 01/08/2022) for CPE.   The patient indicates understanding of these issues and agrees with the plan.  Gwenlyn Perking, FNP

## 2021-10-28 ENCOUNTER — Other Ambulatory Visit: Payer: Self-pay | Admitting: Family Medicine

## 2021-10-28 DIAGNOSIS — Z1231 Encounter for screening mammogram for malignant neoplasm of breast: Secondary | ICD-10-CM

## 2021-10-31 ENCOUNTER — Other Ambulatory Visit: Payer: Self-pay | Admitting: Gastroenterology

## 2021-10-31 NOTE — Telephone Encounter (Signed)
Please submit PA for twice daily Pantoprazole.

## 2021-11-03 ENCOUNTER — Other Ambulatory Visit (HOSPITAL_COMMUNITY): Payer: Self-pay

## 2021-11-03 ENCOUNTER — Other Ambulatory Visit: Payer: Self-pay | Admitting: Gastroenterology

## 2021-11-03 NOTE — Telephone Encounter (Signed)
Please submit for Pantoprazole twice daily.

## 2021-11-04 ENCOUNTER — Other Ambulatory Visit: Payer: Self-pay | Admitting: Gastroenterology

## 2021-11-07 ENCOUNTER — Inpatient Hospital Stay: Admission: RE | Admit: 2021-11-07 | Payer: Self-pay | Source: Ambulatory Visit

## 2021-11-10 ENCOUNTER — Other Ambulatory Visit (HOSPITAL_COMMUNITY): Payer: Self-pay

## 2021-11-19 ENCOUNTER — Inpatient Hospital Stay: Admission: RE | Admit: 2021-11-19 | Payer: Self-pay | Source: Ambulatory Visit

## 2021-11-27 IMAGING — DX DG CERVICAL SPINE COMPLETE 4+V
5 series · 5 of 5 positions shown · non-contrast
Comparison: None.

CLINICAL DATA: Pain following fall

EXAM:
CERVICAL SPINE - COMPLETE 4+ VIEW

[c-spine lat]
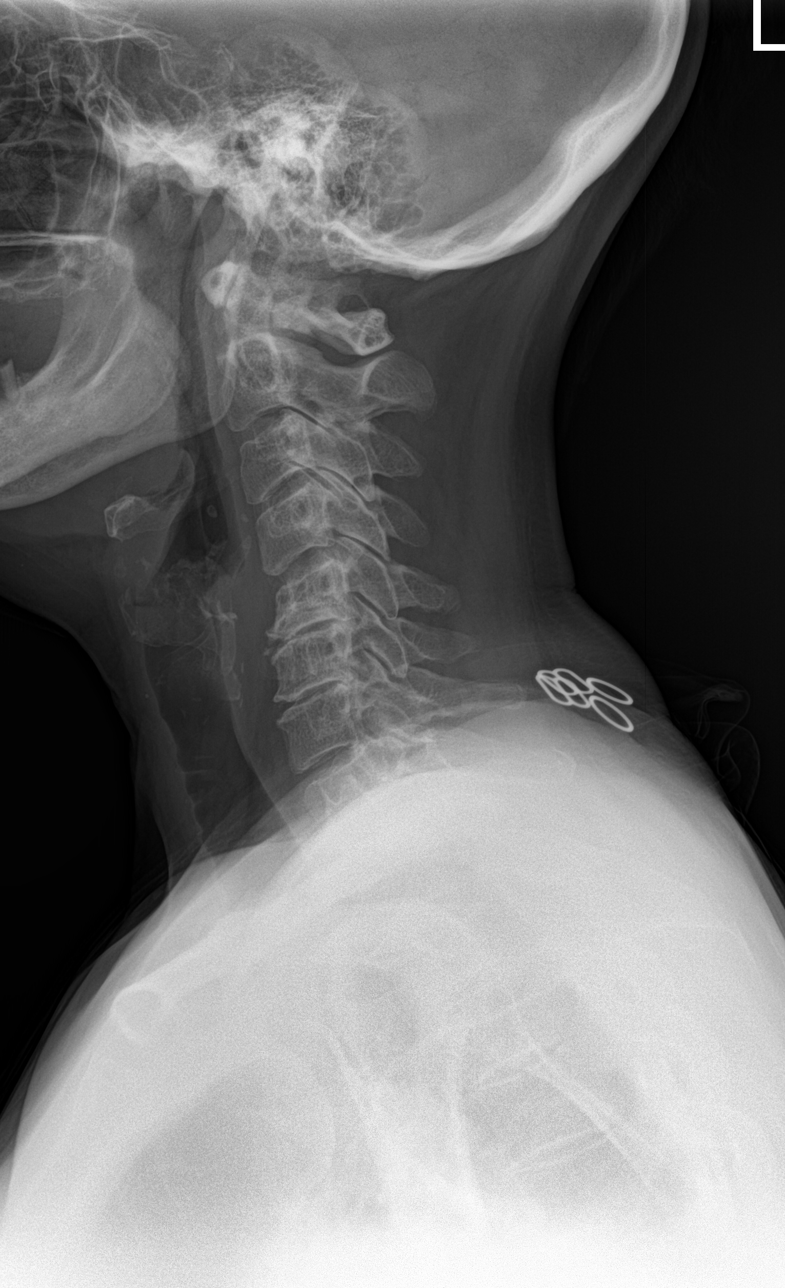

[c-spine obl (1 of 2)]
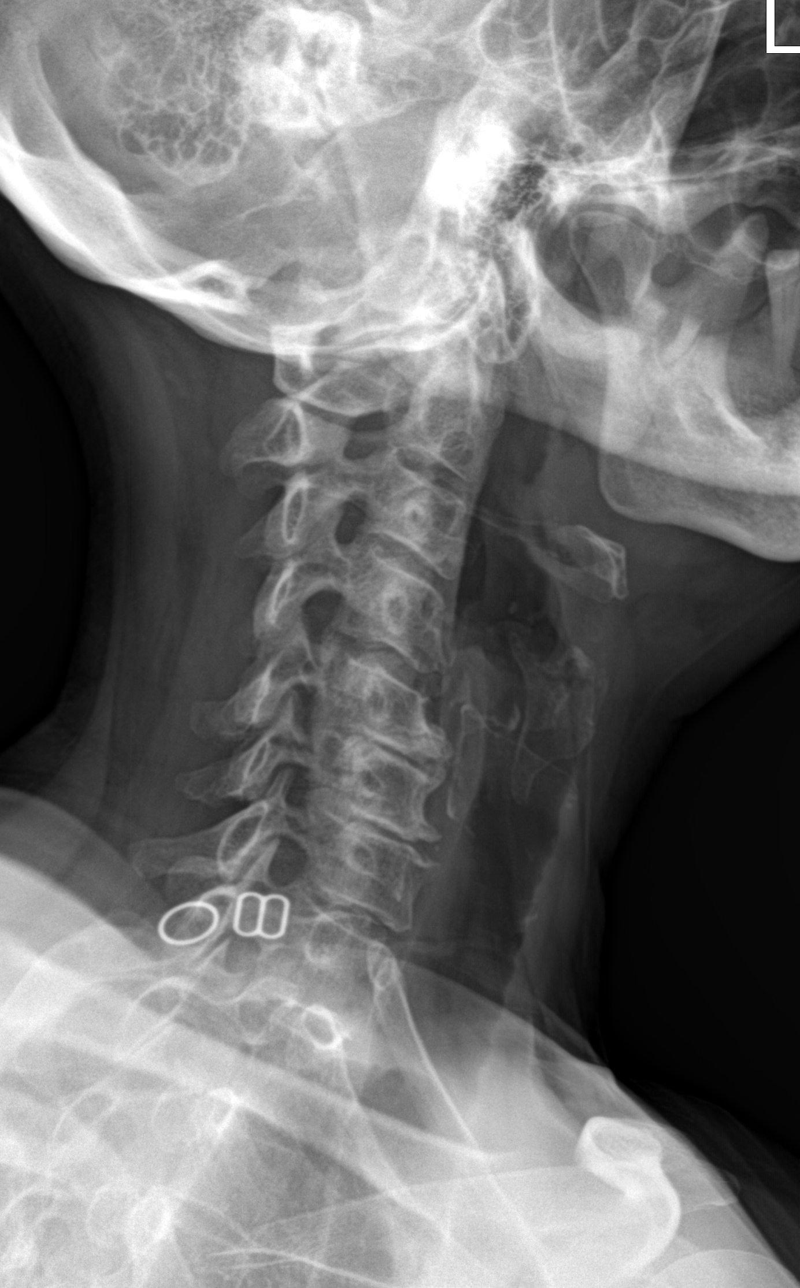

[c-spine obl (2 of 2)]
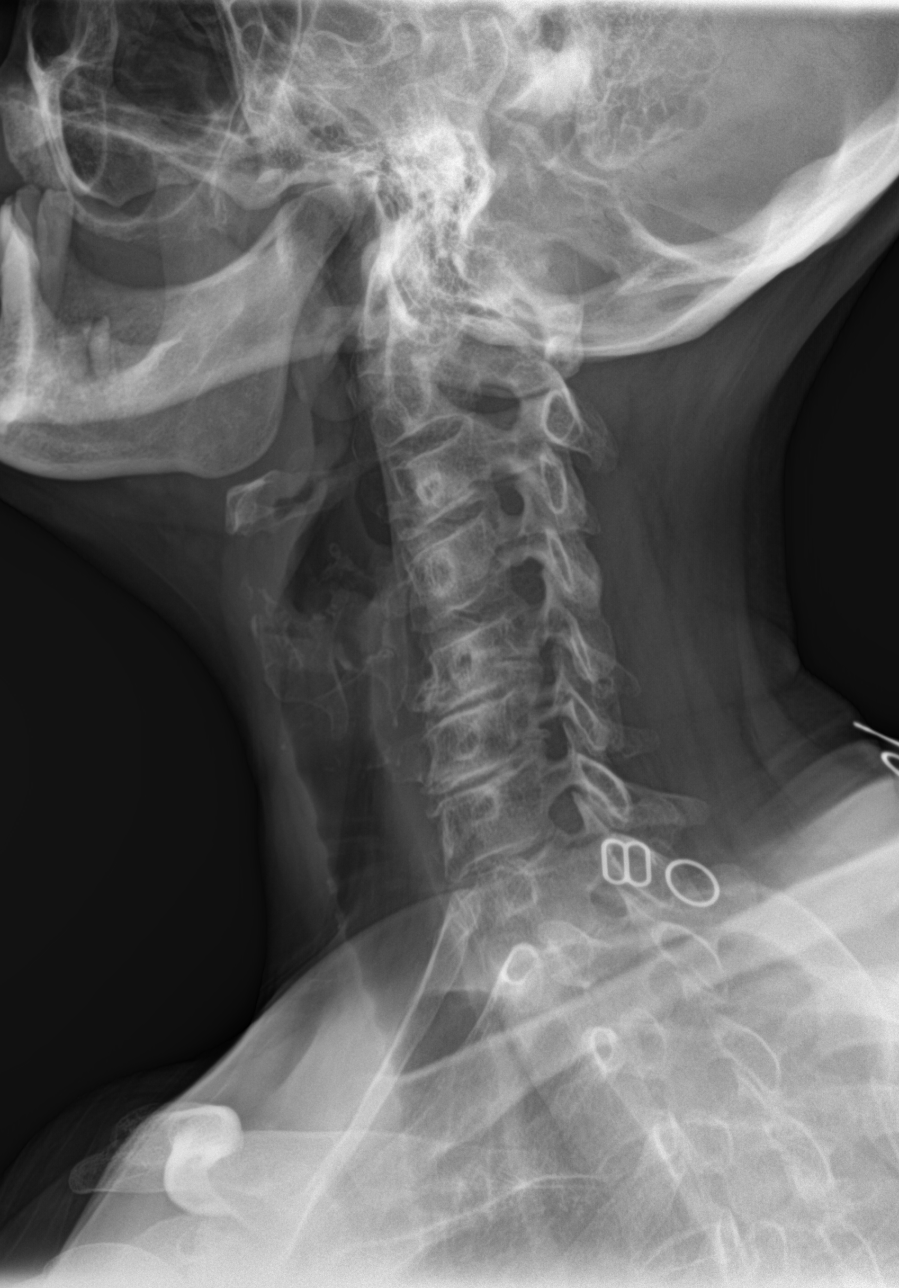

[c-spine ap]
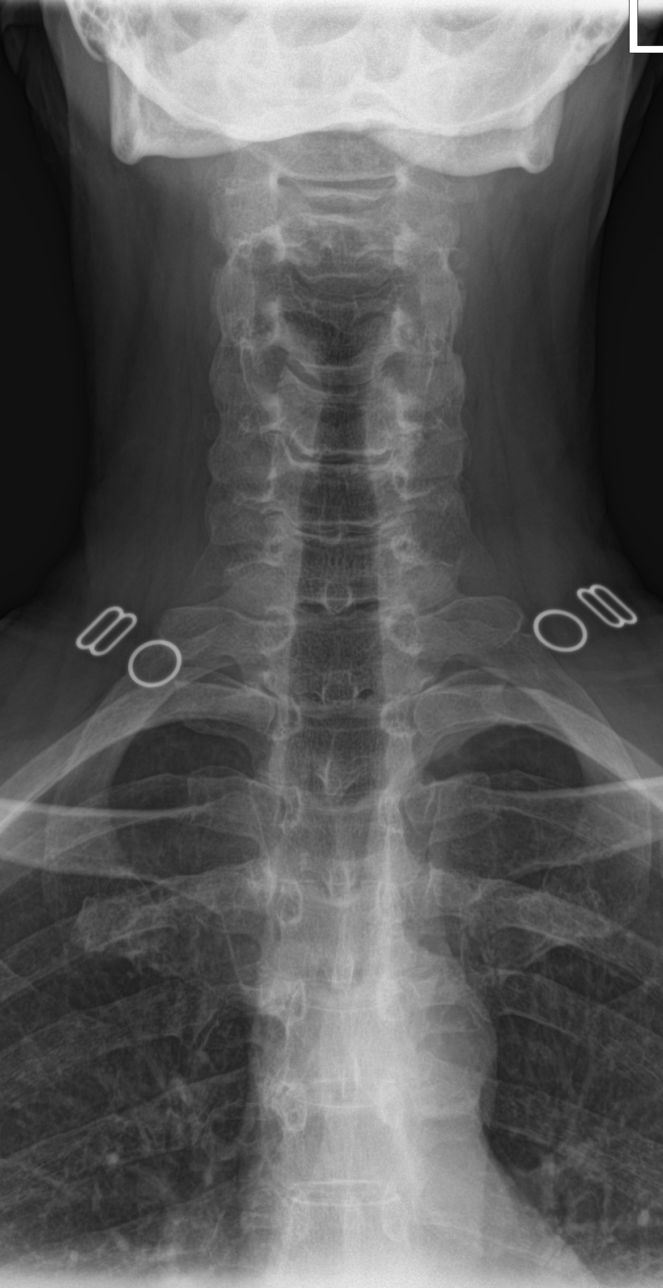

[c-spine open mouth]
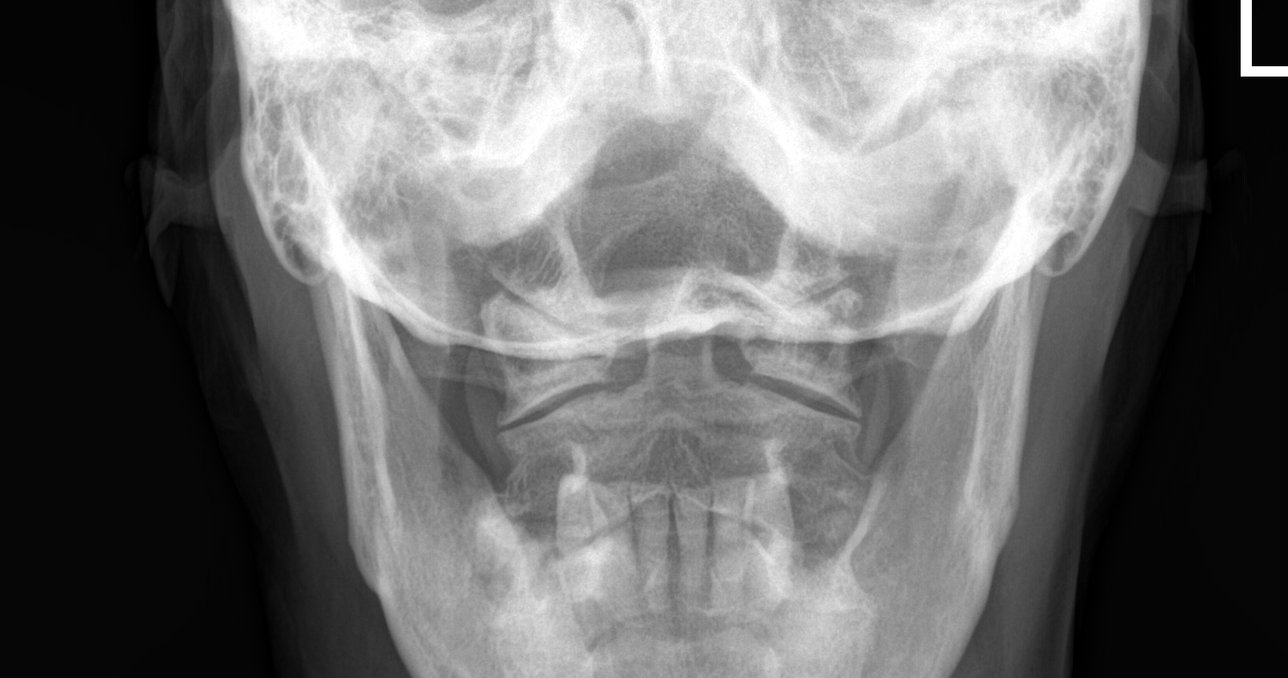

[5 of 5 positions shown; findings below may reference images not displayed]

FINDINGS: Frontal, lateral, open-mouth odontoid, and bilateral oblique views
were obtained. There is no fracture or spondylolisthesis.
Prevertebral soft tissues and predental space regions are normal.
There is severe disc space narrowing at C5-6 and C6-7 with milder
disc space narrowing at C7-T1. Small anterior osteophytes are noted
at C5 and C6. There is facet hypertrophy with exit foraminal
narrowing at C4-5, C5-6, and C6-7 bilaterally. No erosive change.
There is reversal of lordotic curvature. Lung apices are clear.
IMPRESSION: No fracture or spondylolisthesis. Reversal of lordotic curvature is
likely due to a degree of muscle spasm. There is osteoarthritic
change at several levels, most severe at C5-6 and C6-7.

## 2021-11-27 IMAGING — DX DG FEMUR 2+V*L*
4 series · 4 of 4 positions shown · non-contrast
Comparison: None.

CLINICAL DATA: Pain following fall

EXAM:
LEFT FEMUR 2 VIEWS

[femur ap (1 of 2)]
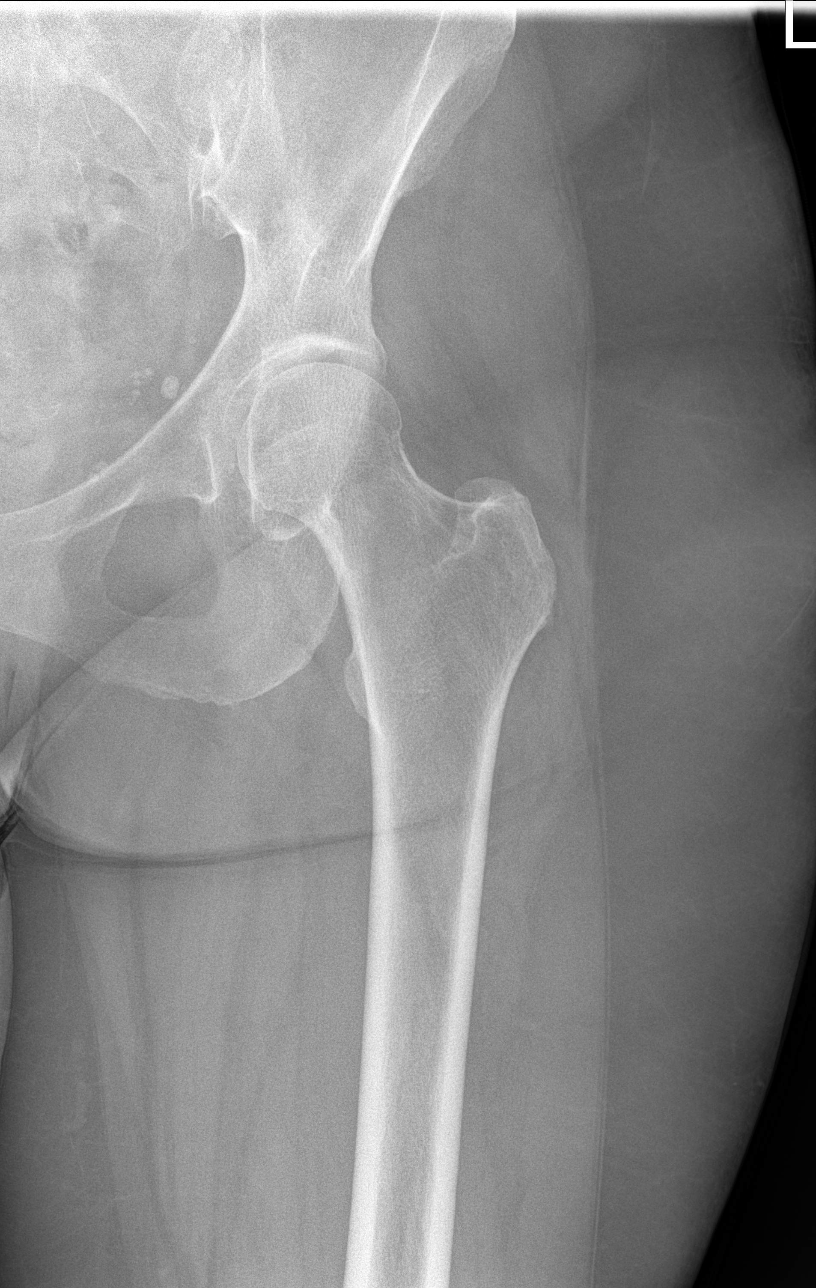

[femur ap (2 of 2)]
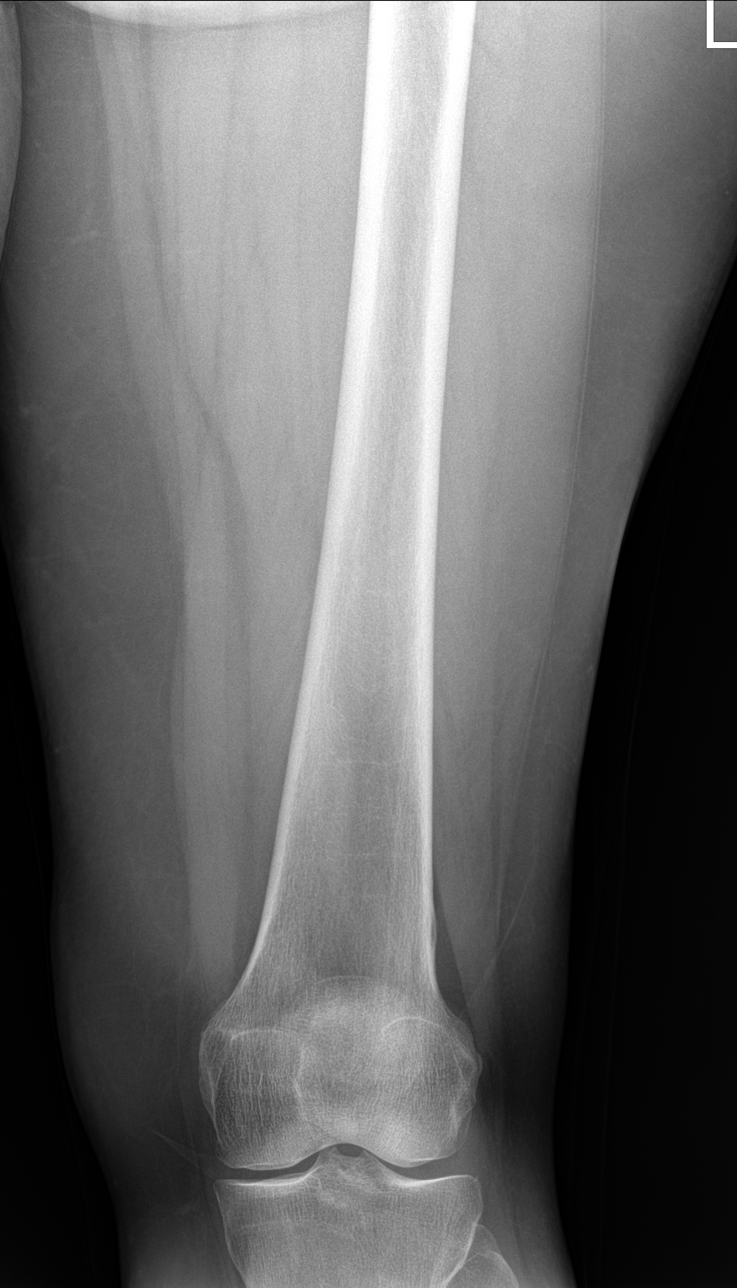

[femur lat (1 of 2)]
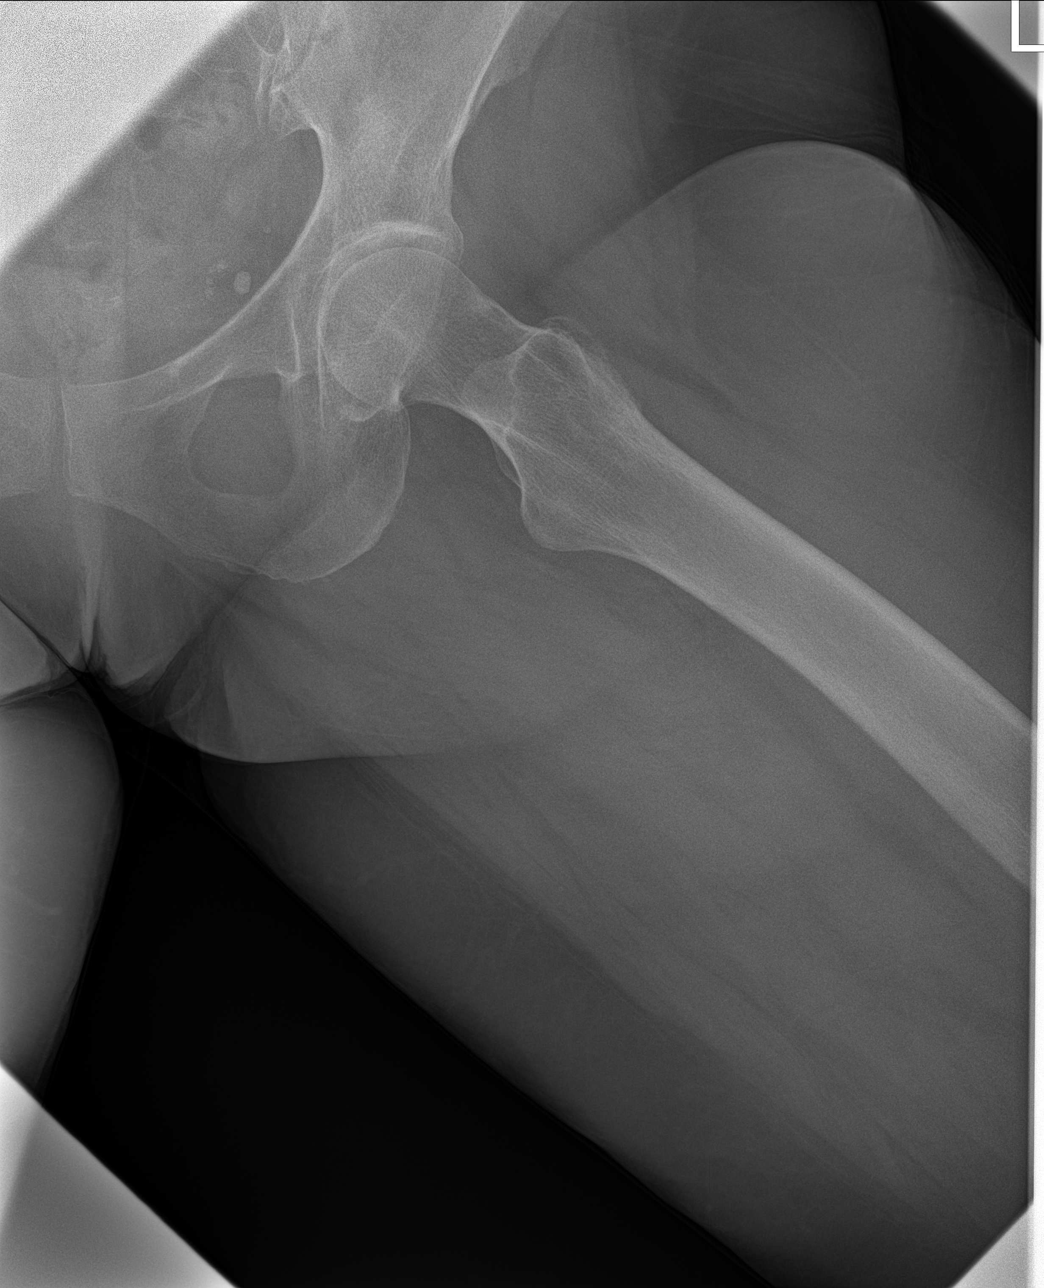

[femur lat (2 of 2)]
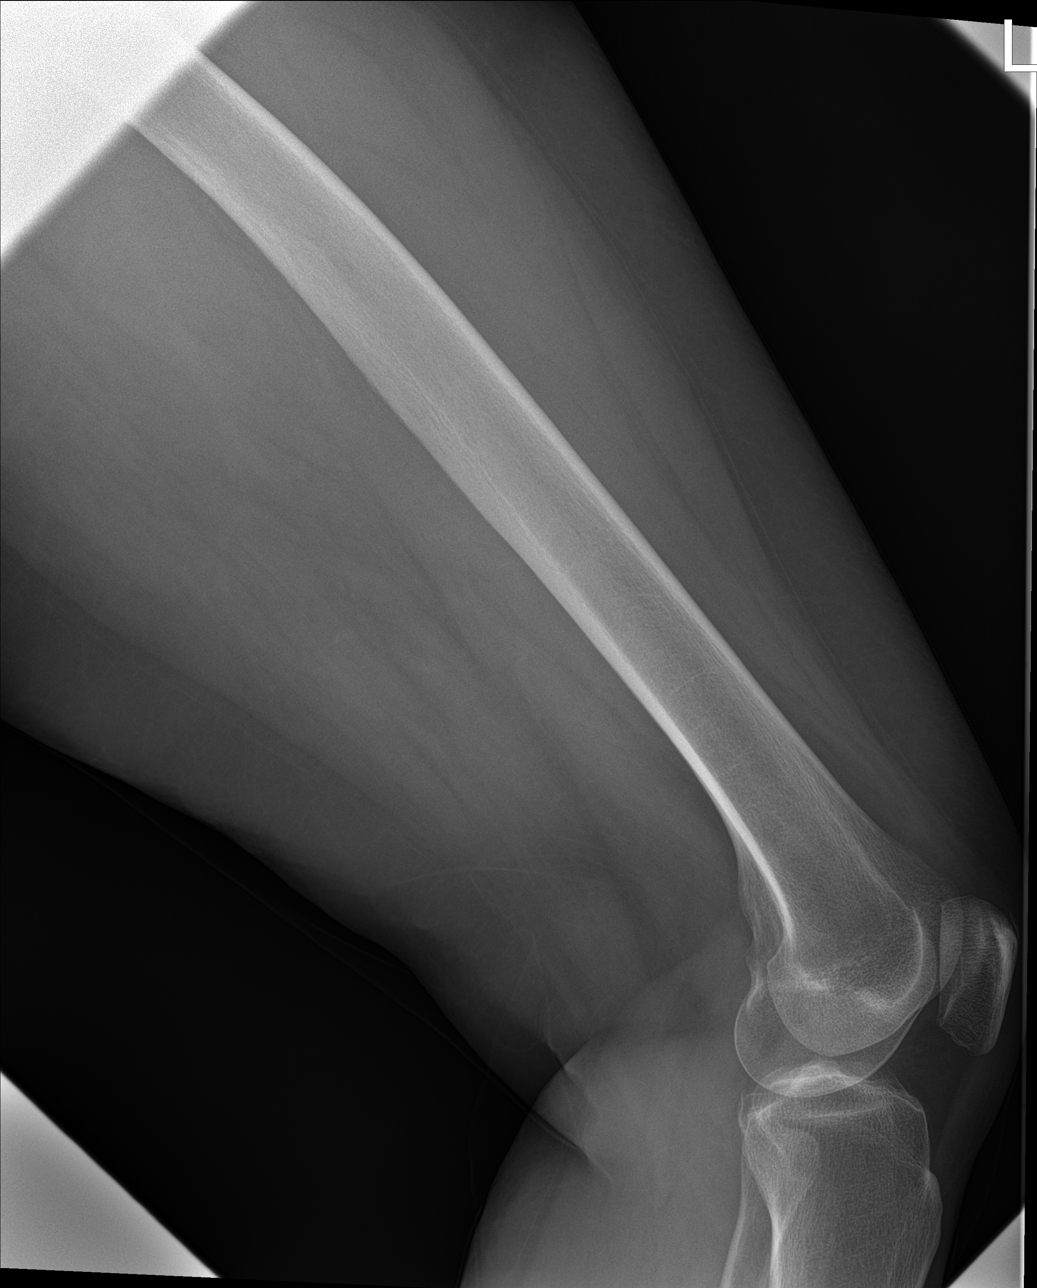

[4 of 4 positions shown; findings below may reference images not displayed]

FINDINGS: Frontal and lateral views were obtained. No fracture or dislocation.
Joint spaces appear unremarkable. No erosive change. No evident knee
joint effusion.
IMPRESSION: No fracture or dislocation. No appreciable arthropathy. No
appreciable knee joint effusion.

## 2021-12-14 ENCOUNTER — Other Ambulatory Visit: Payer: Self-pay | Admitting: Pulmonary Disease

## 2022-02-01 ENCOUNTER — Other Ambulatory Visit: Payer: Self-pay | Admitting: Family Medicine

## 2022-02-01 DIAGNOSIS — G8929 Other chronic pain: Secondary | ICD-10-CM

## 2022-02-13 DIAGNOSIS — Z419 Encounter for procedure for purposes other than remedying health state, unspecified: Secondary | ICD-10-CM | POA: Diagnosis not present

## 2022-03-16 DIAGNOSIS — Z419 Encounter for procedure for purposes other than remedying health state, unspecified: Secondary | ICD-10-CM | POA: Diagnosis not present

## 2022-04-15 ENCOUNTER — Encounter: Payer: Self-pay | Admitting: Family Medicine

## 2022-04-16 DIAGNOSIS — Z419 Encounter for procedure for purposes other than remedying health state, unspecified: Secondary | ICD-10-CM | POA: Diagnosis not present

## 2022-05-14 ENCOUNTER — Ambulatory Visit: Payer: Medicaid Other | Admitting: Family Medicine

## 2022-05-14 ENCOUNTER — Ambulatory Visit (INDEPENDENT_AMBULATORY_CARE_PROVIDER_SITE_OTHER): Payer: Medicaid Other | Admitting: Family Medicine

## 2022-05-14 ENCOUNTER — Encounter: Payer: Self-pay | Admitting: Family Medicine

## 2022-05-14 VITALS — BP 113/64 | HR 91 | Temp 98.2°F | Ht 67.0 in | Wt 176.4 lb

## 2022-05-14 DIAGNOSIS — M5136 Other intervertebral disc degeneration, lumbar region: Secondary | ICD-10-CM

## 2022-05-14 DIAGNOSIS — M5412 Radiculopathy, cervical region: Secondary | ICD-10-CM

## 2022-05-14 DIAGNOSIS — M25551 Pain in right hip: Secondary | ICD-10-CM | POA: Diagnosis not present

## 2022-05-14 DIAGNOSIS — K589 Irritable bowel syndrome without diarrhea: Secondary | ICD-10-CM | POA: Diagnosis not present

## 2022-05-14 DIAGNOSIS — M5441 Lumbago with sciatica, right side: Secondary | ICD-10-CM

## 2022-05-14 DIAGNOSIS — F419 Anxiety disorder, unspecified: Secondary | ICD-10-CM | POA: Insufficient documentation

## 2022-05-14 DIAGNOSIS — F321 Major depressive disorder, single episode, moderate: Secondary | ICD-10-CM | POA: Diagnosis not present

## 2022-05-14 DIAGNOSIS — G8929 Other chronic pain: Secondary | ICD-10-CM

## 2022-05-14 DIAGNOSIS — K2950 Unspecified chronic gastritis without bleeding: Secondary | ICD-10-CM | POA: Diagnosis not present

## 2022-05-14 DIAGNOSIS — K449 Diaphragmatic hernia without obstruction or gangrene: Secondary | ICD-10-CM

## 2022-05-14 DIAGNOSIS — Z148 Genetic carrier of other disease: Secondary | ICD-10-CM

## 2022-05-14 DIAGNOSIS — J449 Chronic obstructive pulmonary disease, unspecified: Secondary | ICD-10-CM | POA: Diagnosis not present

## 2022-05-14 MED ORDER — PANTOPRAZOLE SODIUM 40 MG PO TBEC: 40.0000 mg | DELAYED_RELEASE_TABLET | Freq: Two times a day (BID) | ORAL | 1 refills | Status: DC

## 2022-05-14 MED ORDER — MELOXICAM 15 MG PO TABS: 15.0000 mg | ORAL_TABLET | Freq: Every day | ORAL | 3 refills | Status: DC

## 2022-05-14 MED ORDER — ALBUTEROL SULFATE HFA 108 (90 BASE) MCG/ACT IN AERS: 2.0000 | INHALATION_SPRAY | Freq: Four times a day (QID) | RESPIRATORY_TRACT | 2 refills | Status: DC | PRN

## 2022-05-14 MED ORDER — DICYCLOMINE HCL 10 MG PO CAPS: ORAL_CAPSULE | ORAL | 1 refills | Status: DC

## 2022-05-14 MED ORDER — ALBUTEROL SULFATE (2.5 MG/3ML) 0.083% IN NEBU: 2.5000 mg | INHALATION_SOLUTION | RESPIRATORY_TRACT | 6 refills | Status: DC | PRN

## 2022-05-14 NOTE — Progress Notes (Signed)
Established Patient Office Visit  Subjective   Patient ID: Carla Bell, female    DOB: Apr 29, 1960  Age: 62 y.o. MRN: [id number]  Chief Complaint  Patient presents with   Hip Pain    Hip Pain     Lumbar DDD/right hip pain Bilateral back pain. The pain sometimes radiates down her right thigh. Pain is aggravated by twisting, bending, lifting leg. Report tingling down right leg. Denies recent injury. She is taking gabapentin at night with some relief. She was previoulsly on mobic that was also helpful.   2. Cervical radiculopathy She reports neck pain with numbness tingling down her right arm. The is aggrated by driving.   3. GI referral She has a new insurance and needs a referral back to GI for chronic gastritis, hiatial hernia, and IBS. She has been out of bentyl and protonic. Reprots heartburn, abdominal cramping.   4. Pulmonology referral She also needs a referral back to pulmonology. She has been out of trelegy and reports increased shortness of breath, whezzing. She has been using her nebulizer daily.   5. Depression/anxiety Reports well controlled with celexa.      05/14/2022    2:07 PM 10/08/2021    1:18 PM 06/24/2021    3:03 PM  Depression screen PHQ 2/9  Decreased Interest 0 0 0  Down, Depressed, Hopeless 0 0 0  PHQ - 2 Score 0 0 0  Altered sleeping 0 0 3  Tired, decreased energy '1 1 3  '$ Change in appetite 0 1 3  Feeling bad or failure about yourself  0 0 1  Trouble concentrating 0 0 0  Moving slowly or fidgety/restless 0 0 0  Suicidal thoughts 0 0 0  PHQ-9 Score '1 2 10  '$ Difficult doing work/chores Not difficult at all Not difficult at all       05/14/2022    2:09 PM 10/08/2021    1:18 PM 06/24/2021    3:03 PM 02/21/2021    8:38 AM  GAD 7 : Generalized Anxiety Score  Nervous, Anxious, on Edge 0 1 2 0  Control/stop worrying 0 0 2 0  Worry too much - different things 0 1 2 0  Trouble relaxing 0 1 2 0  Restless 0 1 2 0  Easily annoyed or irritable 0  0 2 0  Afraid - awful might happen 0 0 0 0  Total GAD 7 Score 0 4 12 0  Anxiety Difficulty Not difficult at all Not difficult at all  Not difficult at all       ROS Negative unless specially indicated above in HPI.   Objective:     BP 113/64   Pulse 91   Temp 98.2 F (36.8 C) (Temporal)   Ht '5\' 7"'$  (1.702 m)   Wt 176 lb 6 oz (80 kg)   SpO2 93%   BMI 27.62 kg/m    Physical Exam Vitals and nursing note reviewed.  Constitutional:      General: She is not in acute distress.    Appearance: She is not ill-appearing, toxic-appearing or diaphoretic.  Neck:     Vascular: No carotid bruit.  Cardiovascular:     Rate and Rhythm: Normal rate and regular rhythm.     Heart sounds: Normal heart sounds. No murmur heard. Pulmonary:     Effort: Pulmonary effort is normal. No respiratory distress.     Breath sounds: Normal breath sounds. No wheezing, rhonchi or rales.  Chest:     Chest wall:  No tenderness.  Musculoskeletal:     Cervical back: Neck supple. No rigidity or tenderness.     Lumbar back: Tenderness present. No swelling, edema or bony tenderness. Normal range of motion. Positive right straight leg raise test. Negative left straight leg raise test.     Right lower leg: No edema.     Left lower leg: No edema.  Skin:    General: Skin is warm and dry.  Neurological:     General: No focal deficit present.     Mental Status: She is alert and oriented to person, place, and time.  Psychiatric:        Mood and Affect: Mood normal.        Behavior: Behavior normal.      No results found for any visits on 05/14/22.    The 10-year ASCVD risk score (Arnett DK, et al., 2019) is: 4.2%    Assessment & Plan:   Carla Bell was seen today for hip pain.  Diagnoses and all orders for this visit:  Right hip pain DDD (degenerative disc disease), lumbar Discussed lumbar radiculopathy as cause. Continue gabapentin, mobic. Referral to ortho.  -     Ambulatory referral to Orthopedic  Surgery  Cervical radiculopathy Mobic, gabapentin. Referral to ortho.  -     Ambulatory referral to Orthopedic Surgery  Depression, major, single episode, moderate (HCC) Anxiety Well controlled on current regimen. Continue Celexa.   Alpha-1-antitrypsin deficiency carrier COPD GOLD II/ MZ/ quit smoking 1981  Restart trelegy. Samples given. Albuterol prn. Referral back to pulmonology.  -     Ambulatory referral to Pulmonology -     albuterol (VENTOLIN HFA) 108 (90 Base) MCG/ACT inhaler; Inhale 2 puffs into the lungs every 6 (six) hours as needed for wheezing or shortness of breath. -     albuterol (PROVENTIL) (2.5 MG/3ML) 0.083% nebulizer solution; Take 3 mLs (2.5 mg total) by nebulization every 4 (four) hours as needed for wheezing or shortness of breath.  Hiatal hernia -     Ambulatory referral to Gastroenterology  Irritable bowel syndrome, unspecified type Restart bentyl. Referral back to GI.  -     Ambulatory referral to Gastroenterology -     dicyclomine (BENTYL) 10 MG capsule; TAKE 1 CAPSULE BY MOUTH 4 TIMES DAILY BEFORE MEAL(S) AND AT BEDTIME  Chronic gastritis, presence of bleeding unspecified, unspecified gastritis type Restart protonix. Referral back to GI.  -     Ambulatory referral to Gastroenterology -     pantoprazole (PROTONIX) 40 MG tablet; Take 1 tablet (40 mg total) by mouth 2 (two) times daily.   Return in about 3 months (around 08/12/2022) for CPE.   The patient indicates understanding of these issues and agrees with the plan.   Gwenlyn Perking, FNP

## 2022-05-15 DIAGNOSIS — Z419 Encounter for procedure for purposes other than remedying health state, unspecified: Secondary | ICD-10-CM | POA: Diagnosis not present

## 2022-06-11 ENCOUNTER — Ambulatory Visit (INDEPENDENT_AMBULATORY_CARE_PROVIDER_SITE_OTHER): Payer: Medicaid Other | Admitting: Orthopaedic Surgery

## 2022-06-11 ENCOUNTER — Other Ambulatory Visit (INDEPENDENT_AMBULATORY_CARE_PROVIDER_SITE_OTHER): Payer: Medicaid Other

## 2022-06-11 ENCOUNTER — Telehealth: Payer: Self-pay | Admitting: Family Medicine

## 2022-06-11 ENCOUNTER — Encounter: Payer: Self-pay | Admitting: Orthopaedic Surgery

## 2022-06-11 VITALS — Ht 68.0 in | Wt 179.0 lb

## 2022-06-11 DIAGNOSIS — G8929 Other chronic pain: Secondary | ICD-10-CM | POA: Diagnosis not present

## 2022-06-11 DIAGNOSIS — M545 Low back pain, unspecified: Secondary | ICD-10-CM | POA: Diagnosis not present

## 2022-06-11 DIAGNOSIS — M542 Cervicalgia: Secondary | ICD-10-CM | POA: Diagnosis not present

## 2022-06-11 DIAGNOSIS — M7061 Trochanteric bursitis, right hip: Secondary | ICD-10-CM

## 2022-06-11 DIAGNOSIS — K589 Irritable bowel syndrome without diarrhea: Secondary | ICD-10-CM

## 2022-06-11 DIAGNOSIS — K2950 Unspecified chronic gastritis without bleeding: Secondary | ICD-10-CM

## 2022-06-11 MED ORDER — BUPIVACAINE HCL 0.25 % IJ SOLN
2.0000 mL | INTRAMUSCULAR | Status: AC | PRN
  Administered 2022-06-11: 2 mL via INTRA_ARTICULAR

## 2022-06-11 MED ORDER — METHYLPREDNISOLONE ACETATE 40 MG/ML IJ SUSP
40.0000 mg | INTRAMUSCULAR | Status: AC | PRN
  Administered 2022-06-11: 40 mg via INTRA_ARTICULAR

## 2022-06-11 MED ORDER — LIDOCAINE HCL 1 % IJ SOLN
1.0000 mL | INTRAMUSCULAR | Status: AC | PRN
  Administered 2022-06-11: 1 mL

## 2022-06-11 NOTE — Telephone Encounter (Signed)
No answer, no voicemail.

## 2022-06-11 NOTE — Telephone Encounter (Signed)
Are you alright to place the referral or does the pt need to be seen in the office.

## 2022-06-11 NOTE — Telephone Encounter (Signed)
Patient calling to let us know that her gastro doctor is retiring, she would like to get another referral sent to an office in [place] or [place]

## 2022-06-11 NOTE — Progress Notes (Signed)
Office Visit Note   Patient: Carla Bell           Date of Birth: Aug 04, 1960           MRN: [id number] Visit Date: 06/11/2022              Requested by: Gwenlyn Perking, [place],  [place] [address] PCP: Gwenlyn Perking, FNP   Assessment & Plan: Visit Diagnoses:  1. Neck pain   2. Chronic bilateral low back pain, unspecified whether sciatica present   3. Trochanteric bursitis, right hip     Plan: Trochanteric injection performed which gave her relief of her sharp pain in her hip with rotation and position changing.  We discussed she likely has some disc bulges with some radicular nerve root tension signs.  Hopefully some of the injection will help with her back and neck symptoms.  Recheck 4 weeks.  Follow-Up Instructions: No follow-ups on file.   Orders:  Orders Placed This Encounter  Procedures   Large Joint Inj: R greater trochanter   XR Cervical Spine 2 or 3 views   XR Lumbar Spine 2-3 Views   No orders of the defined types were placed in this encounter.     Procedures: Large Joint Inj: R greater trochanter on 06/11/2022 1:53 PM Details: lateral approach Medications: 1 mL lidocaine 1 %; 2 mL bupivacaine 0.25 %; 40 mg methylPREDNISolone acetate 40 MG/ML      Clinical Data: No additional findings.   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  Backs bothering her more than her neck she has been on Mobic.  When she turns her toes occasionally she has a sharp pain points laterally over the trochanter which catches.  She has to be extremely careful when she turns problems when she rolls on her right side.  She denies claudication symptoms.  She has had pain that radiates down her right arm into her middle finger.  Review of Systems previous smoker quit 1981  history of sciatica on the right depression anxiety ovarian cancer postsurgery chemo and radiation.  Other systems noncontributory to HPI.   Objective: Vital Signs: Ht 5\' 8"  (1.727 m)   Wt 179 lb (81.2 kg)   BMI 27.22 kg/m   Physical Exam Constitutional:      Appearance: She is well-developed.  HENT:     Head: Normocephalic.     Right Ear: External ear normal.     Left Ear: External ear normal. There is no impacted cerumen.  Eyes:     Pupils: Pupils are equal, round, and reactive to light.  Neck:     Thyroid: No thyromegaly.     Trachea: No tracheal deviation.  Cardiovascular:     Rate and Rhythm: Normal rate.  Pulmonary:     Effort: Pulmonary effort is normal.  Abdominal:     Palpations: Abdomen is soft.  Musculoskeletal:     Cervical back: No rigidity.  Skin:    General: Skin is warm and dry.  Neurological:     Mental Status: She is alert and oriented to person, place, and time.  Psychiatric:        Behavior: Behavior normal.     Ortho Exam negative logroll hips positive straight leg raising the right 80 degrees positive  positive compression test significant sciatic notch tenderness on the right left.  Extremity reflexes are intact.  No triceps weakness.  Brachial plexus tenderness on the right negative on the left.  Specialty Comments:  No specialty comments available.  Imaging: XR Lumbar Spine 2-3 Views  Result Date: 06/11/2022 AP lateral lumbar images are obtained and reviewed this shows normal lumbar curvature to space heights are maintained hip sacroiliac joint soft tissues are normal. Impression: Normal lumbar spine radiographs.  XR Cervical Spine 2 or 3 views  Result Date: 06/11/2022 AP lateral cervical spine images show reversal of the cervical lordosis.  Disc base narrowing C5-6 C6-7 with spurring.  No listhesis. Impression mid cervical spondylosis C5-6 C6-7.    PMFS History: Patient Active Problem List   Diagnosis Date Noted   Trochanteric bursitis,  right hip 06/11/2022   Depression, major, single episode, moderate ([place]) 05/14/2022   Anxiety 05/14/2022   Chronic gastritis 05/14/2022   Irritable bowel syndrome 05/14/2022   Hiatal hernia 05/14/2022   Cervical radiculopathy 05/14/2022   Pure hypercholesterolemia 02/21/2021   Chronic insomnia 02/21/2021   Alpha-1-antitrypsin deficiency ([place]) 02/21/2021   Impingement syndrome of left shoulder 10/09/2020   Sciatic neuropathy, right 12/10/2016   S/P cholecystectomy 07/15/2015   Dyspnea 07/12/2015   COPD GOLD II/ MZ/ quit smoking 1981  07/12/2015   Pericardial effusion 07/09/2015   Pleuritic chest pain 07/09/2015   Alpha-1-antitrypsin deficiency carrier 06/14/2015   Vitiligo 06/14/2015   History of ovarian cancer, sp TAH and bilat salpingo oophorectomy, chemo, radiation 06/14/1987   Past Medical History:  Diagnosis Date   Alpha-1-antitrypsin deficiency ([place])    Asthma    Bowel obstruction (HCC)    Celiac disease    Colon polyps    COPD (chronic obstructive pulmonary disease) ([place])    Crohn's colitis ([place])    Gallstones    IBS (irritable bowel syndrome)    Ovarian cancer ([place]) 1990   Pericardial effusion 07/09/2015   Pleuritic chest pain 07/09/2015   Rotator cuff tear    left    Family History  Problem Relation Age of Onset   Alpha-1 antitrypsin deficiency Mother    Alpha-1 antitrypsin deficiency Daughter    Colon cancer Neg Hx    Rectal cancer Neg Hx    Stomach cancer Neg Hx    Prostate cancer Neg Hx     Past Surgical History:  Procedure Laterality Date   ABDOMINAL HYSTERECTOMY     1989   BUNIONECTOMY WITH HAMMERTOE RECONSTRUCTION Right    CHOLECYSTECTOMY     COLONOSCOPY  04/25/2020   [place],    TONSILLECTOMY     Social History   Occupational History   Occupation: Dietitian  Tobacco Use   Smoking status: Former    Packs/day: 0.25    Years: 2.00    Additional pack years: 0.00    Total pack years: 0.50    Types: Cigarettes    Quit date:  03/17/1979    Years since quitting: 43.2   Smokeless tobacco: Never  Vaping Use   Vaping Use: Never used  Substance and Sexual Activity   Alcohol use: No    Alcohol/week: 0.0 standard drinks of alcohol   Drug use: No   Sexual activity: Not on file

## 2022-06-11 NOTE — Telephone Encounter (Signed)
Referral placed. Thank you

## 2022-06-15 DIAGNOSIS — Z419 Encounter for procedure for purposes other than remedying health state, unspecified: Secondary | ICD-10-CM | POA: Diagnosis not present

## 2022-07-09 ENCOUNTER — Ambulatory Visit (INDEPENDENT_AMBULATORY_CARE_PROVIDER_SITE_OTHER): Payer: Medicaid Other | Admitting: Orthopaedic Surgery

## 2022-07-09 ENCOUNTER — Encounter: Payer: Self-pay | Admitting: Orthopaedic Surgery

## 2022-07-09 VITALS — Ht 68.0 in | Wt 179.0 lb

## 2022-07-09 DIAGNOSIS — G8929 Other chronic pain: Secondary | ICD-10-CM

## 2022-07-09 DIAGNOSIS — M545 Low back pain, unspecified: Secondary | ICD-10-CM | POA: Insufficient documentation

## 2022-07-09 DIAGNOSIS — Z8739 Personal history of other diseases of the musculoskeletal system and connective tissue: Secondary | ICD-10-CM

## 2022-07-09 DIAGNOSIS — M7061 Trochanteric bursitis, right hip: Secondary | ICD-10-CM

## 2022-07-09 NOTE — Progress Notes (Signed)
Office Visit Note   Patient: Carla Bell           Date of Birth: [birth year]           MRN: [id number] Visit Date: 07/09/2022              Requested by: Gabriel Earing, FNP [address] [place],  [place] [address] PCP: Gabriel Earing, FNP   Assessment & Plan: Visit Diagnoses:  1. Trochanteric bursitis, right hip   2. Chronic right-sided low back pain, unspecified whether sciatica present     Plan: Patient's previous lumbar x-rays demonstrated some small marginal osteophytes in the hip and right hip joint space narrowing versus left.  She does have some recurrent trochanteric  bursitis and will set up some physical therapy for low back pain evaluation and treatment.  Past history of gout.  2 years ago I see a uric acid which was 5.7.  She is having some discomfort in her left ankle with slight erythema.  Past history of gout in her left wrist several years ago also right great toe first MTP joint.  Will check a spot uric acid and I can call her with the results.  Proceed with lumbar therapy recheck 6 weeks.  Follow-Up Instructions: No follow-ups on file.   Orders:  No orders of the defined types were placed in this encounter.  No orders of the defined types were placed in this encounter.     Procedures: No procedures performed   Clinical Data: No additional findings.   Subjective: Chief Complaint  Patient presents with   Lower Back - Pain, Follow-up   Right Hip - Follow-up, Pain    HPI 62 year old female returns she states trochanteric injection gave her great relief then wore off on its back bothering her again.  She has had past history of sciatica with disc bulge.  She is able to heel and toe walk pain radiates from her back into her right buttocks and into her thigh.  Review of Systems 14 point system update unchanged.   Objective: Vital Signs: Ht  (1.727 m)   Wt 179 lb (81.2 kg)   BMI 27.22 kg/m   Physical Exam Constitutional:       Appearance: She is well-developed.  HENT:     Head: Normocephalic.     Right Ear: External ear normal.     Left Ear: External ear normal. There is no impacted cerumen.  Eyes:     Pupils: Pupils are equal, round, and reactive to light.  Neck:     Thyroid: No thyromegaly.     Trachea: No tracheal deviation.  Cardiovascular:     Rate and Rhythm: Normal rate.  Pulmonary:     Effort: Pulmonary effort is normal.  Abdominal:     Palpations: Abdomen is soft.  Musculoskeletal:     Cervical back: No rigidity.  Skin:    General: Skin is warm and dry.  Neurological:     Mental Status: She is alert and oriented to person, place, and time.  Psychiatric:        Behavior: Behavior normal.     Ortho Exam patient has close the tenderness of the right trochanter.  Some reproduction pain with internal rotation of her right hip.  Sciatic notch tenderness on the right negative on the left.  Previous right foot surgery but she can still heel and toe walk.  Specialty Comments:  No specialty comments available.  Imaging: No results found.  PMFS History: Patient Active Problem List   Diagnosis Date Noted   Low back pain 07/09/2022   Trochanteric bursitis, right hip 06/11/2022   Depression, major, single episode, moderate 05/14/2022   Anxiety 05/14/2022   Chronic gastritis 05/14/2022   Irritable bowel syndrome 05/14/2022   Hiatal hernia 05/14/2022   Cervical radiculopathy 05/14/2022   Pure hypercholesterolemia 02/21/2021   Chronic insomnia 02/21/2021   Alpha-1-antitrypsin deficiency 02/21/2021   Impingement syndrome of left shoulder 10/09/2020   Sciatic neuropathy, right 12/10/2016   S/P cholecystectomy 07/15/2015   Dyspnea 07/12/2015   COPD GOLD II/ MZ/ quit smoking 1981  07/12/2015   Pericardial effusion 07/09/2015   Pleuritic chest pain 07/09/2015   Alpha-1-antitrypsin deficiency carrier 06/14/2015   Vitiligo 06/14/2015   History of ovarian cancer, sp TAH and bilat salpingo  oophorectomy, chemo, radiation 06/14/1987   Past Medical History:  Diagnosis Date   Alpha-1-antitrypsin deficiency    Asthma    Bowel obstruction    Celiac disease    Colon polyps    COPD (chronic obstructive pulmonary disease)    Crohn's colitis    Gallstones    IBS (irritable bowel syndrome)    Ovarian cancer 1990   Pericardial effusion 07/09/2015   Pleuritic chest pain 07/09/2015   Rotator cuff tear    left    Family History  Problem Relation Age of Onset   Alpha-1 antitrypsin deficiency Mother    Alpha-1 antitrypsin deficiency Daughter    Colon cancer Neg Hx    Rectal cancer Neg Hx    Stomach cancer Neg Hx    Prostate cancer Neg Hx     Past Surgical History:  Procedure Laterality Date   ABDOMINAL HYSTERECTOMY     1989   BUNIONECTOMY WITH HAMMERTOE RECONSTRUCTION Right    CHOLECYSTECTOMY     COLONOSCOPY  04/25/2020   [facility],    TONSILLECTOMY     Social History   Occupational History   Occupation: Research scientist (physical sciences)  Tobacco Use   Smoking status: Former    Packs/day: 0.25    Years: 2.00    Additional pack years: 0.00    Total pack years: 0.50    Types: Cigarettes    Quit date: 03/17/1979    Years since quitting: 43.3   Smokeless tobacco: Never  Vaping Use   Vaping Use: Never used  Substance and Sexual Activity   Alcohol use: No    Alcohol/week: 0.0 standard drinks of alcohol   Drug use: No   Sexual activity: Not on file

## 2022-07-13 DIAGNOSIS — M109 Gout, unspecified: Secondary | ICD-10-CM | POA: Diagnosis not present

## 2022-07-15 DIAGNOSIS — Z419 Encounter for procedure for purposes other than remedying health state, unspecified: Secondary | ICD-10-CM | POA: Diagnosis not present

## 2022-07-20 ENCOUNTER — Telehealth: Payer: Self-pay | Admitting: Family Medicine

## 2022-07-20 DIAGNOSIS — K2950 Unspecified chronic gastritis without bleeding: Secondary | ICD-10-CM

## 2022-07-20 DIAGNOSIS — K589 Irritable bowel syndrome without diarrhea: Secondary | ICD-10-CM

## 2022-07-20 NOTE — Telephone Encounter (Signed)
Referral placed and tried to contact pt to let her know but no answer and voicemail not set up.

## 2022-07-20 NOTE — Telephone Encounter (Signed)
REFERRAL REQUEST Telephone Note  Have you been seen at our office for this problem? Yes last month (Advise that they may need an appointment with their PCP before a referral can be done)  Reason for Referral: Pt GI dr retired and she needs referral to new dr. Referral discussed with patient: yes. She thought her dr was still working and she wouldn't need referral  Best contact number of patient for referral team: [phone]    Has patient been seen by a specialist for this issue before: yes  Patient provider preference for referral: she was to see dr Lissa Hoard in [place] Patient location preference for referral: [place]   Patient notified that referrals can take up to a week or longer to process. If they haven't heard anything within a week they should call back and speak with the referral department.

## 2022-07-20 NOTE — Telephone Encounter (Signed)
Pt aware.

## 2022-07-21 DIAGNOSIS — M545 Low back pain, unspecified: Secondary | ICD-10-CM | POA: Diagnosis not present

## 2022-07-27 ENCOUNTER — Inpatient Hospital Stay: Admission: RE | Admit: 2022-07-27 | Payer: Medicaid Other | Source: Ambulatory Visit

## 2022-08-15 DIAGNOSIS — Z419 Encounter for procedure for purposes other than remedying health state, unspecified: Secondary | ICD-10-CM | POA: Diagnosis not present

## 2022-08-18 ENCOUNTER — Telehealth: Payer: Self-pay | Admitting: Family Medicine

## 2022-08-18 DIAGNOSIS — M545 Low back pain, unspecified: Secondary | ICD-10-CM | POA: Diagnosis not present

## 2022-08-18 NOTE — Telephone Encounter (Signed)
Pt called to check on status of referral for GI. Reviewed referral notes with patient. Pt says she was established with [facility] but provider has retired so now she wants to see Dr Lissa Hoard.  Pt says she needs referral to be approved right away because she is almost out of her medications.   Please advise and call patient with update.

## 2022-08-19 ENCOUNTER — Other Ambulatory Visit: Payer: Self-pay | Admitting: Family Medicine

## 2022-08-19 DIAGNOSIS — K589 Irritable bowel syndrome without diarrhea: Secondary | ICD-10-CM

## 2022-08-19 DIAGNOSIS — K2950 Unspecified chronic gastritis without bleeding: Secondary | ICD-10-CM

## 2022-08-24 ENCOUNTER — Encounter: Payer: Self-pay | Admitting: Family Medicine

## 2022-08-24 ENCOUNTER — Ambulatory Visit (INDEPENDENT_AMBULATORY_CARE_PROVIDER_SITE_OTHER): Payer: Medicaid Other | Admitting: Family Medicine

## 2022-08-24 VITALS — BP 138/71 | HR 70 | Temp 98.2°F | Ht 68.0 in | Wt 182.0 lb

## 2022-08-24 DIAGNOSIS — E782 Mixed hyperlipidemia: Secondary | ICD-10-CM | POA: Diagnosis not present

## 2022-08-24 DIAGNOSIS — Z9189 Other specified personal risk factors, not elsewhere classified: Secondary | ICD-10-CM

## 2022-08-24 DIAGNOSIS — Z0001 Encounter for general adult medical examination with abnormal findings: Secondary | ICD-10-CM

## 2022-08-24 DIAGNOSIS — Z13228 Encounter for screening for other metabolic disorders: Secondary | ICD-10-CM | POA: Diagnosis not present

## 2022-08-24 DIAGNOSIS — K2950 Unspecified chronic gastritis without bleeding: Secondary | ICD-10-CM | POA: Diagnosis not present

## 2022-08-24 DIAGNOSIS — Z130 Encounter for screening for diseases of the blood and blood-forming organs and certain disorders involving the immune mechanism: Secondary | ICD-10-CM | POA: Diagnosis not present

## 2022-08-24 DIAGNOSIS — J449 Chronic obstructive pulmonary disease, unspecified: Secondary | ICD-10-CM

## 2022-08-24 DIAGNOSIS — E663 Overweight: Secondary | ICD-10-CM

## 2022-08-24 DIAGNOSIS — K589 Irritable bowel syndrome without diarrhea: Secondary | ICD-10-CM | POA: Diagnosis not present

## 2022-08-24 DIAGNOSIS — Z1329 Encounter for screening for other suspected endocrine disorder: Secondary | ICD-10-CM | POA: Diagnosis not present

## 2022-08-24 DIAGNOSIS — Z0000 Encounter for general adult medical examination without abnormal findings: Secondary | ICD-10-CM

## 2022-08-24 DIAGNOSIS — Z148 Genetic carrier of other disease: Secondary | ICD-10-CM

## 2022-08-24 MED ORDER — TRELEGY ELLIPTA 100-62.5-25 MCG/ACT IN AEPB
1.0000 | INHALATION_SPRAY | Freq: Every day | RESPIRATORY_TRACT | 11 refills | Status: DC
Start: 2022-08-24 — End: 2022-09-30

## 2022-08-24 MED ORDER — PANTOPRAZOLE SODIUM 40 MG PO TBEC
40.0000 mg | DELAYED_RELEASE_TABLET | Freq: Two times a day (BID) | ORAL | 1 refills | Status: DC
Start: 2022-08-24 — End: 2022-10-07

## 2022-08-24 MED ORDER — DICYCLOMINE HCL 10 MG PO CAPS: ORAL_CAPSULE | ORAL | 1 refills | Status: DC

## 2022-08-24 NOTE — Patient Instructions (Addendum)
Our records indicate that you are due for your annual mammogram/breast imaging. While there is no way to prevent breast cancer, early detection provides the best opportunity for curing it. For women over the age of 40, the American Cancer Society recommends a yearly clinical breast exam and a yearly mammogram. These practices have saved thousands of lives. We need your help to ensure that you are receiving optimal medical care. Please call the imaging location that has done you previous mammograms. Please remember to list us as your primary care. This helps make sure we receive a report and can update your chart.  Below is the contact information for several local breast imaging centers. You may call the location that works best for you, and they will be happy to assistance in making you an appointment. You do not need an order for a regular screening mammogram. However, if you are having any problems or concerns with you breast area, please let your primary care provider know, and appropriate orders will be placed. Please let our office know if you have any questions or concerns. Or if you need information for another imaging center not on this list or outside of the area. We are commented to working with you on your health care journey.   The mobile unit/bus ([facility]) - they come twice a month to our location.  These appointments can be made through our office or by call [facility]  [facility]  [address] [place], [place] [address] Phone [phone]  [facility] Radiology Department  [address]  [place], [place] [address] [phone]  [facility])  [address] [place], [place] [address] [phone]  [facility] [place]  [address] [place] [address] [phone]  [facility]  [address], [address] [place] [address] [phone]  [facility] [place]  [address] , [place] [address] [phone]  [facility] [address] [place], [place] [address] [phone]  [facility] [address] [place], [place] [address] [phone]  [facility] [address] [place] [address] [phone]   Health Maintenance, Female Adopting a healthy lifestyle and getting preventive care are important in promoting health and wellness. Ask your health care provider about: The right schedule for you to have regular tests and exams. Things you can do on your own to prevent diseases and keep yourself healthy. What should I know about diet, weight, and exercise? Eat a healthy diet  Eat a diet that includes plenty of vegetables, fruits, low-fat dairy products, and lean protein. Do not eat a lot of foods that are high in solid fats, added sugars, or sodium. Maintain a healthy weight Body mass index (BMI) is used to identify weight problems. It estimates body fat based on height and weight. Your health care provider can help determine your BMI and help you achieve or maintain a healthy weight. Get regular exercise Get regular exercise. This is one of the most important things you can do for your health. Most adults should: Exercise for at least 150 minutes each week. The exercise should increase your heart rate and make you sweat (moderate-intensity exercise). Do strengthening   strengthening exercises at least twice a week. This is in addition to the moderate-intensity exercise. Spend less time sitting. Even light physical activity can be beneficial. Watch cholesterol and blood lipids Have your blood tested for lipids and cholesterol at 64 years of age, then have this test every 5 years. Have your cholesterol levels checked  more often if: Your lipid or cholesterol levels are high. You are older than 64 years of age. You are at high risk for heart disease. What should I know about cancer screening? Depending on your health history and family history, you may need to have cancer screening at various ages. This may include screening for: Breast cancer. Cervical cancer. Colorectal cancer. Skin cancer. Lung cancer. What should I know about heart disease, diabetes, and high blood pressure? Blood pressure and heart disease High blood pressure causes heart disease and increases the risk of stroke. This is more likely to develop in people who have high blood pressure readings or are overweight. Have your blood pressure checked: Every 3-5 years if you are 91-40 years of age. Every year if you are 65 years old or older. Diabetes Have regular diabetes screenings. This checks your fasting blood sugar level. Have the screening done: Once every three years after age 70 if you are at a normal weight and have a low risk for diabetes. More often and at a younger age if you are overweight or have a high risk for diabetes. What should I know about preventing infection? Hepatitis B If you have a higher risk for hepatitis B, you should be screened for this virus. Talk with your health care provider to find out if you are at risk for hepatitis B infection. Hepatitis C Testing is recommended for: Everyone born from [birth year] through 1967. Anyone with known risk factors for hepatitis C. Sexually transmitted infections (STIs) Get screened for STIs, including gonorrhea and chlamydia, if: You are sexually active and are younger than 64 years of age. You are older than 64 years of age and your health care provider tells you that you are at risk for this type of infection. Your sexual activity has changed since you were last screened, and you are at increased risk for chlamydia or gonorrhea. Ask your health care provider if you are at  risk. Ask your health care provider about whether you are at high risk for HIV. Your health care provider may recommend a prescription medicine to help prevent HIV infection. If you choose to take medicine to prevent HIV, you should first get tested for HIV. You should then be tested every 3 months for as long as you are taking the medicine. Pregnancy If you are about to stop having your period (premenopausal) and you may become pregnant, seek counseling before you get pregnant. Take 400 to 800 micrograms (mcg) of folic acid every day if you become pregnant. Ask for birth control (contraception) if you want to prevent pregnancy. Osteoporosis and menopause Osteoporosis is a disease in which the bones lose minerals and strength with aging. This can result in bone fractures. If you are 72 years old or older, or if you are at risk for osteoporosis and fractures, ask your health care provider if you should: Be screened for bone loss. Take a calcium or vitamin D supplement to lower your risk of fractures. Be given hormone replacement therapy (HRT) to treat symptoms of menopause. Follow these instructions at home: Alcohol use Do not drink alcohol if: Your health care provider tells you not to  drink. You are pregnant, may be pregnant, or are planning to become pregnant. If you drink alcohol: Limit how much you have to: 0-1 drink a day. Know how much alcohol is in your drink. In the [place] one drink equals one 12 oz bottle of beer (355 mL), one 5 oz glass of wine (148 mL), or one 1 oz glass of hard liquor (44 mL). Lifestyle Do not use any products that contain nicotine or tobacco. These products include cigarettes, chewing tobacco, and vaping devices, such as e-cigarettes. If you need help quitting, ask your health care provider. Do not use street drugs. Do not share needles. Ask your health care provider for help if you need support or information about quitting drugs. General instructions Schedule  regular health, dental, and eye exams. Stay current with your vaccines. Tell your health care provider if: You often feel depressed. You have ever been abused or do not feel safe at home. Summary Adopting a healthy lifestyle and getting preventive care are important in promoting health and wellness. Follow your health care provider's instructions about healthy diet, exercising, and getting tested or screened for diseases. Follow your health care provider's instructions on monitoring your cholesterol and blood pressure. This information is not intended to replace advice given to you by your health care provider. Make sure you discuss any questions you have with your health care provider. Document Revised: 07/22/2020 Document Reviewed: 07/22/2020 Elsevier Patient Education  2024 [employer].

## 2022-08-24 NOTE — Progress Notes (Signed)
Complete physical exam  Patient: Carla Bell   DOB: 05/09/1960   62 y.o. Female  MRN: [id number]  Subjective:    Chief Complaint  Patient presents with   Follow-up    CPE    Carla Bell is a 62 y.o. female who presents today for a complete physical exam. She reports consuming a general diet.  She is currently completing PT.  She generally feels fairly well. She reports sleeping well. She does have additional problems to discuss today.   She feels like her chronic GI symptoms are well controlled with protonix and bentyl. Without these medication she have pain with eating. She is in need of refills today. She has been trying to schedule an appt with GI but has had push back with referrals. The last referral has now been authorized.   She reports COPD symptoms have been stabilize with Trelegy. She also needs refills for this. She did not get a call regarding the pulmonology referral.   She reports that her daughter was diagnosed with liver failure due hepatitis B recently. She is concerned about possible exposure as she is a household contact. She has not been vaccinated.   She has established with ortho for the back and neck pain. She is currently completing PT for this.  Most recent fall risk assessment:    08/24/2022    1:59 PM  Fall Risk   Falls in the past year? 0  Number falls in past yr: 0  Injury with Fall? 0  Risk for fall due to : No Fall Risks  Follow up Falls evaluation completed;Education provided     Most recent depression screenings:    08/24/2022    2:02 PM 05/14/2022    2:07 PM 10/08/2021    1:18 PM  Depression screen PHQ 2/9  Decreased Interest 0 0 0  Down, Depressed, Hopeless 0 0 0  PHQ - 2 Score 0 0 0  Altered sleeping 0 0 0  Tired, decreased energy 2 1 1   Change in appetite 0 0 1  Feeling bad or failure about yourself  0 0 0  Trouble concentrating 1 0 0  Moving slowly or fidgety/restless 0 0 0  Suicidal thoughts 0 0 0  PHQ-9 Score  3 1 2   Difficult doing work/chores Not difficult at all Not difficult at all Not difficult at all      08/24/2022    2:02 PM 05/14/2022    2:09 PM 10/08/2021    1:18 PM 06/24/2021    3:03 PM  GAD 7 : Generalized Anxiety Score  Nervous, Anxious, on Edge 0 0 1 2  Control/stop worrying 0 0 0 2  Worry too much - different things 0 0 1 2  Trouble relaxing 0 0 1 2  Restless 0 0 1 2  Easily annoyed or irritable 0 0 0 2  Afraid - awful might happen 0 0 0 0  Total GAD 7 Score 0 0 4 12  Anxiety Difficulty Not difficult at all Not difficult at all Not difficult at all      Vision:Not within last year  and Dental: No current dental problems and No regular dental care   Past Medical History:  Diagnosis Date   Alpha-1-antitrypsin deficiency (HCC)    Asthma    Bowel obstruction (HCC)    Celiac disease    Colon polyps    COPD (chronic obstructive pulmonary disease) (HCC)    Crohn's colitis (HCC)    Gallstones  IBS (irritable bowel syndrome)    Ovarian cancer (HCC) 1990   Pericardial effusion 07/09/2015   Pleuritic chest pain 07/09/2015   Rotator cuff tear    left      Patient Care Team: Gabriel Earing, FNP as PCP - General (Family Medicine)   Outpatient Medications Prior to Visit  Medication Sig   albuterol (PROVENTIL) (2.5 MG/3ML) 0.083% nebulizer solution Take 3 mLs (2.5 mg total) by nebulization every 4 (four) hours as needed for wheezing or shortness of breath.   albuterol (VENTOLIN HFA) 108 (90 Base) MCG/ACT inhaler Inhale 2 puffs into the lungs every 6 (six) hours as needed for wheezing or shortness of breath.   citalopram (CELEXA) 20 MG tablet Take 1 tablet (20 mg total) by mouth daily.   dicyclomine (BENTYL) 10 MG capsule TAKE 1 CAPSULE BY MOUTH 4 TIMES DAILY BEFORE MEAL(S) AND AT BEDTIME   Fluticasone-Umeclidin-Vilant (TRELEGY ELLIPTA) 100-62.5-25 MCG/INH AEPB Inhale 100 mcg into the lungs daily.   gabapentin (NEURONTIN) 300 MG capsule Take 2 capsules (600 mg total) by  mouth at bedtime.   meloxicam (MOBIC) 15 MG tablet Take 1 tablet (15 mg total) by mouth daily.   pantoprazole (PROTONIX) 40 MG tablet Take 1 tablet (40 mg total) by mouth 2 (two) times daily.   No facility-administered medications prior to visit.    ROS Negative unless specially indicated above in HPI.       Objective:     BP 138/71   Pulse 70   Temp 98.2 F (36.8 C) (Oral)   Ht 5\' 8"  (1.727 m)   Wt 182 lb (82.6 kg)   SpO2 90%   BMI 27.67 kg/m    Physical Exam Vitals and nursing note reviewed.  Constitutional:      General: She is not in acute distress.    Appearance: Normal appearance. She is not ill-appearing.  HENT:     Head: Normocephalic.     Right Ear: Tympanic membrane, ear canal and external ear normal.     Left Ear: Tympanic membrane, ear canal and external ear normal.     Nose: Nose normal.     Mouth/Throat:     Mouth: Mucous membranes are dry.     Pharynx: Oropharynx is clear.  Eyes:     Extraocular Movements: Extraocular movements intact.     Conjunctiva/sclera: Conjunctivae normal.     Pupils: Pupils are equal, round, and reactive to light.  Neck:     Thyroid: No thyroid mass, thyromegaly or thyroid tenderness.     Vascular: No carotid bruit.  Cardiovascular:     Rate and Rhythm: Normal rate and regular rhythm.     Pulses: Normal pulses.     Heart sounds: Normal heart sounds. No murmur heard.    No friction rub. No gallop.  Pulmonary:     Effort: Pulmonary effort is normal.     Breath sounds: Normal breath sounds.  Abdominal:     General: Bowel sounds are normal. There is no distension.     Palpations: Abdomen is soft. There is no mass.     Tenderness: There is no abdominal tenderness. There is no guarding.  Musculoskeletal:        General: No swelling or tenderness. Normal range of motion.     Cervical back: Normal range of motion and neck supple.     Right lower leg: No edema.     Left lower leg: No edema.  Skin:    General: Skin is warm  and dry.     Capillary Refill: Capillary refill takes less than 2 seconds.     Findings: No lesion or rash.  Neurological:     General: No focal deficit present.     Mental Status: She is alert and oriented to person, place, and time.     Cranial Nerves: No cranial nerve deficit.     Motor: No weakness.     Gait: Gait normal.  Psychiatric:        Mood and Affect: Mood normal.        Behavior: Behavior normal.        Thought Content: Thought content normal.        Judgment: Judgment normal.      No results found for any visits on 08/24/22.     Assessment & Plan:    Routine Health Maintenance and Physical Exam  Berdella was seen today for annual exam.  Diagnoses and all orders for this visit:  Routine general medical examination at a health care facility  Irritable bowel syndrome, unspecified type Well controlled on current regimen. Follow up with GI.  -     dicyclomine (BENTYL) 10 MG capsule; TAKE 1 CAPSULE BY MOUTH 4 TIMES DAILY BEFORE MEAL(S) AND AT BEDTIME  Chronic gastritis, presence of bleeding unspecified, unspecified gastritis type Well controlled on current regimen. Follow up with GI.  -     pantoprazole (PROTONIX) 40 MG tablet; Take 1 tablet (40 mg total) by mouth 2 (two) times daily.  Alpha-1-antitrypsin deficiency carrier COPD GOLD II/ MZ/ quit smoking 1981  Stable. Continue trelegy. Referral back to pulm.  -     Ambulatory referral to Pulmonology -     Fluticasone-Umeclidin-Vilant (TRELEGY ELLIPTA) 100-62.5-25 MCG/ACT AEPB; Inhale 1 puff into the lungs daily.  Mixed hyperlipidemia Diet and exercise. Labs pending.  -     Lipid panel  Overweight Diet and exercise. Labs pending.   At increased risk for exposure to hepatitis B virus Household contact recently dx with Hep B. Labs pending. Discussed vaccination if non immune.  -     Acute Hep Panel & Hep B Surface Ab  Screening for endocrine, metabolic and immunity disorder -     CBC with  Differential/Platelet -     CMP14+EGFR -     TSH -     VITAMIN D 25 Hydroxy (Vit-D Deficiency, Fractures)    Immunization History  Administered Date(s) Administered   Influenza,inj,Quad PF,6+ Mos 04/14/2016   Moderna Sars-Covid-2 Vaccination 05/15/2019, 06/14/2019   Pneumococcal Conjugate-13 07/26/2015   Pneumococcal Polysaccharide-23 04/14/2016   Tdap 04/14/2016    Health Maintenance  Topic Date Due   MAMMOGRAM  Never done   Zoster Vaccines- Shingrix (1 of 2) 01/09/2023 (Originally 12/31/1979)   COVID-19 Vaccine (3 - Moderna risk series) 05/30/2023 (Originally 07/12/2019)   INFLUENZA VACCINE  10/15/2022   Colonoscopy  04/26/2023   DTaP/Tdap/Td (2 - Td or Tdap) 04/14/2026   Hepatitis C Screening  Completed   HIV Screening  Completed   HPV VACCINES  Aged Out    Discussed health benefits of physical activity, and encouraged her to engage in regular exercise appropriate for her age and condition.  Problem List Items Addressed This Visit       Respiratory   COPD GOLD II/ MZ/ quit smoking 1981    Relevant Medications   Fluticasone-Umeclidin-Vilant (TRELEGY ELLIPTA) 100-62.5-25 MCG/ACT AEPB   Other Relevant Orders   Ambulatory referral to Pulmonology     Digestive   Chronic gastritis  Relevant Medications   pantoprazole (PROTONIX) 40 MG tablet   Irritable bowel syndrome - Primary   Relevant Medications   dicyclomine (BENTYL) 10 MG capsule   pantoprazole (PROTONIX) 40 MG tablet     Other   Alpha-1-antitrypsin deficiency carrier   Relevant Medications   Fluticasone-Umeclidin-Vilant (TRELEGY ELLIPTA) 100-62.5-25 MCG/ACT AEPB   Other Relevant Orders   Ambulatory referral to Pulmonology   Other Visit Diagnoses     Mixed hyperlipidemia       Relevant Orders   Lipid panel   Overweight       At increased risk for exposure to hepatitis B virus       Relevant Orders   Acute Hep Panel & Hep B Surface Ab   Screening for endocrine, metabolic and immunity disorder        Relevant Orders   CBC with Differential/Platelet   CMP14+EGFR   TSH   VITAMIN D 25 Hydroxy (Vit-D Deficiency, Fractures)   Routine general medical examination at a health care facility          Return in about 6 months (around 02/23/2023) for chronic follow up.  The patient indicates understanding of these issues and agrees with the plan.    Gabriel Earing, FNP

## 2022-08-25 LAB — LIPID PANEL
Chol/HDL Ratio: 4.1 ratio (ref 0.0–4.4)
Cholesterol, Total: 183 mg/dL (ref 100–199)
HDL: 45 mg/dL (ref >39)
LDL Chol Calc (NIH): 95 mg/dL (ref 0–99)
Triglycerides: 258 mg/dL — ABNORMAL HIGH (ref 0–149)
VLDL Cholesterol Cal: 43 mg/dL — ABNORMAL HIGH (ref 5–40)

## 2022-08-25 LAB — TSH: TSH: 1.27 u[IU]/mL (ref 0.450–4.500)

## 2022-08-25 LAB — CBC WITH DIFFERENTIAL/PLATELET
Basophils Absolute: 0 10*3/uL (ref 0.0–0.2)
Basos: 0 %
EOS (ABSOLUTE): 0.2 10*3/uL (ref 0.0–0.4)
Eos: 4 %
Hematocrit: 36.7 % (ref 34.0–46.6)
Hemoglobin: 12 g/dL (ref 11.1–15.9)
Immature Grans (Abs): 0 10*3/uL (ref 0.0–0.1)
Immature Granulocytes: 0 %
Lymphocytes Absolute: 1.5 10*3/uL (ref 0.7–3.1)
Lymphs: 24 %
MCH: 30.8 pg (ref 26.6–33.0)
MCHC: 32.7 g/dL (ref 31.5–35.7)
MCV: 94 fL (ref 79–97)
Monocytes Absolute: 0.3 10*3/uL (ref 0.1–0.9)
Monocytes: 5 %
Neutrophils Absolute: 4.2 10*3/uL (ref 1.4–7.0)
Neutrophils: 67 %
Platelets: 240 10*3/uL (ref 150–450)
RBC: 3.9 x10E6/uL (ref 3.77–5.28)
RDW: 12.3 % (ref 11.7–15.4)
WBC: 6.3 10*3/uL (ref 3.4–10.8)

## 2022-08-25 LAB — CMP14+EGFR
ALT: 25 IU/L (ref 0–32)
AST: 21 IU/L (ref 0–40)
Albumin/Globulin Ratio: 2.3
Albumin: 4.2 g/dL (ref 3.9–4.9)
Alkaline Phosphatase: 111 IU/L (ref 44–121)
BUN/Creatinine Ratio: 19 (ref 12–28)
BUN: 15 mg/dL (ref 8–27)
Bilirubin Total: <0.2 mg/dL (ref 0.0–1.2)
CO2: 30 mmol/L — ABNORMAL HIGH (ref 20–29)
Calcium: 9.3 mg/dL (ref 8.7–10.3)
Chloride: 104 mmol/L (ref 96–106)
Creatinine, Ser: 0.79 mg/dL (ref 0.57–1.00)
Globulin, Total: 1.8 g/dL (ref 1.5–4.5)
Glucose: 99 mg/dL (ref 70–99)
Potassium: 4.7 mmol/L (ref 3.5–5.2)
Sodium: 144 mmol/L (ref 134–144)
Total Protein: 6 g/dL (ref 6.0–8.5)
eGFR: 84 mL/min/{1.73_m2} (ref >59)

## 2022-08-25 LAB — ACUTE HEP PANEL AND HEP B SURFACE AB
Hep A IgM: NEGATIVE
Hep B C IgM: NEGATIVE
Hep C Virus Ab: NONREACTIVE
Hepatitis B Surf Ab Quant: <3.5 m[IU]/mL — ABNORMAL LOW (ref >9.9)
Hepatitis B Surface Ag: NEGATIVE

## 2022-08-25 LAB — VITAMIN D 25 HYDROXY (VIT D DEFICIENCY, FRACTURES): Vit D, 25-Hydroxy: 28.8 ng/mL — ABNORMAL LOW (ref 30.0–100.0)

## 2022-08-26 ENCOUNTER — Other Ambulatory Visit: Payer: Self-pay | Admitting: Family Medicine

## 2022-08-26 DIAGNOSIS — E559 Vitamin D deficiency, unspecified: Secondary | ICD-10-CM

## 2022-08-26 MED ORDER — VITAMIN D (ERGOCALCIFEROL) 1.25 MG (50000 UNIT) PO CAPS
50000.0000 [IU] | ORAL_CAPSULE | ORAL | 0 refills | Status: DC
Start: 2022-08-26 — End: 2023-02-25

## 2022-08-31 ENCOUNTER — Other Ambulatory Visit (HOSPITAL_COMMUNITY): Payer: Self-pay

## 2022-08-31 ENCOUNTER — Telehealth: Payer: Self-pay

## 2022-08-31 NOTE — Telephone Encounter (Signed)
PA has been approved from 08/31/2022-08/31/2023

## 2022-08-31 NOTE — Telephone Encounter (Signed)
Patient Advocate Encounter   Received notification from [facility] [place] that prior authorization is required for Trelegy Ellipta 100-62.5-25MCG/ACT aerosol powder   Submitted: 08-31-2022 Key [id number]  Status is pending

## 2022-09-14 DIAGNOSIS — Z419 Encounter for procedure for purposes other than remedying health state, unspecified: Secondary | ICD-10-CM | POA: Diagnosis not present

## 2022-09-15 ENCOUNTER — Ambulatory Visit: Payer: Medicaid Other | Admitting: Gastroenterology

## 2022-09-15 NOTE — Progress Notes (Deleted)
GI Office Note    Referring Provider: Gabriel Earing, FNP Primary Care Physician:  Gabriel Earing, FNP  Primary Gastroenterologist:  Chief Complaint   No chief complaint on file.    History of Present Illness   Carla Bell is a 62 y.o. female presenting today      EGD 04/2020: -Hill Grade III hiatal hernia. Diffuse mildly erythematous mucosa without bleeding found in gastric body. Biopsies taken. Mild chronic gastritis. No H.pylori -examined duodenum normal. Biopsies taken. benign  Colonoscopy 04/2020: -diverticulosis -three sessile polyps in rectum, sigmoid, hepatic flexure. Tubular adenomas -random colon biopsies, benign -nonbleeding internal hemorrhoids -colonoscopy 3 years.     Medications   Current Outpatient Medications  Medication Sig Dispense Refill   albuterol (PROVENTIL) (2.5 MG/3ML) 0.083% nebulizer solution Take 3 mLs (2.5 mg total) by nebulization every 4 (four) hours as needed for wheezing or shortness of breath. 75 mL 6   albuterol (VENTOLIN HFA) 108 (90 Base) MCG/ACT inhaler Inhale 2 puffs into the lungs every 6 (six) hours as needed for wheezing or shortness of breath. 8 g 2   citalopram (CELEXA) 20 MG tablet Take 1 tablet (20 mg total) by mouth daily. 90 tablet 3   dicyclomine (BENTYL) 10 MG capsule TAKE 1 CAPSULE BY MOUTH 4 TIMES DAILY BEFORE MEAL(S) AND AT BEDTIME 120 capsule 1   Fluticasone-Umeclidin-Vilant (TRELEGY ELLIPTA) 100-62.5-25 MCG/ACT AEPB Inhale 1 puff into the lungs daily. 1 each 11   gabapentin (NEURONTIN) 300 MG capsule Take 2 capsules (600 mg total) by mouth at bedtime. 180 capsule 3   meloxicam (MOBIC) 15 MG tablet Take 1 tablet (15 mg total) by mouth daily. 30 tablet 3   pantoprazole (PROTONIX) 40 MG tablet Take 1 tablet (40 mg total) by mouth 2 (two) times daily. 90 tablet 1   Vitamin D, Ergocalciferol, (DRISDOL) 1.25 MG (50000 UNIT) CAPS capsule Take 1 capsule (50,000 Units total) by mouth every 7 (seven) days. 8  capsule 0   No current facility-administered medications for this visit.    Allergies   Allergies as of 09/15/2022 - Review Complete 08/24/2022  Allergen Reaction Noted   Demerol [meperidine] Nausea Only 12/16/2016    Past Medical History   Past Medical History:  Diagnosis Date   Alpha-1-antitrypsin deficiency (HCC)    Asthma    Bowel obstruction (HCC)    Celiac disease    Colon polyps    COPD (chronic obstructive pulmonary disease) (HCC)    Crohn's colitis (HCC)    Gallstones    IBS (irritable bowel syndrome)    Ovarian cancer (HCC) 1990   Pericardial effusion 07/09/2015   Pleuritic chest pain 07/09/2015   Rotator cuff tear    left    Past Surgical History   Past Surgical History:  Procedure Laterality Date   ABDOMINAL HYSTERECTOMY     1989   BUNIONECTOMY WITH HAMMERTOE RECONSTRUCTION Right    CHOLECYSTECTOMY     COLONOSCOPY  04/25/2020   Tressia Danas,    TONSILLECTOMY      Past Family History   Family History  Problem Relation Age of Onset   Alpha-1 antitrypsin deficiency Mother    Alpha-1 antitrypsin deficiency Daughter    Colon cancer Neg Hx    Rectal cancer Neg Hx    Stomach cancer Neg Hx    Prostate cancer Neg Hx     Past Social History   Social History   Socioeconomic History   Marital status: Married    Spouse  name: Not on file   Number of children: 3   Years of education: 14   Highest education level: Not on file  Occupational History   Occupation: Research scientist (physical sciences)  Tobacco Use   Smoking status: Former    Packs/day: 0.25    Years: 2.00    Additional pack years: 0.00    Total pack years: 0.50    Types: Cigarettes    Quit date: 03/17/1979    Years since quitting: 43.5   Smokeless tobacco: Never  Vaping Use   Vaping Use: Never used  Substance and Sexual Activity   Alcohol use: No    Alcohol/week: 0.0 standard drinks of alcohol   Drug use: No   Sexual activity: Not on file  Other Topics Concern   Not on file  Social  History Narrative   Lives with husband in a 2 story home.  Has 3 children.  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  Education: 2 years of college.   Social Determinants of Health   Financial Resource Strain: Not on file  Food Insecurity: Not on file  Transportation Needs: Not on file  Physical Activity: Not on file  Stress: Not on file  Social Connections: Not on file  Intimate Partner Violence: Not on file    Review of Systems   General: Negative for anorexia, weight loss, fever, chills, fatigue, weakness. Eyes: Negative for vision changes.  ENT: Negative for hoarseness, difficulty swallowing , nasal congestion. CV: Negative for chest pain, angina, palpitations, dyspnea on exertion, peripheral edema.  Respiratory: Negative for dyspnea at rest, dyspnea on exertion, cough, sputum, wheezing.  GI: See history of present illness. GU:  Negative for dysuria, hematuria, urinary incontinence, urinary frequency, nocturnal urination.  MS: Negative for joint pain, low back pain.  Derm: Negative for rash or itching.  Neuro: Negative for weakness, abnormal sensation, seizure, frequent headaches, memory loss,  confusion.  Psych: Negative for anxiety, depression, suicidal ideation, hallucinations.  Endo: Negative for unusual weight change.  Heme: Negative for bruising or bleeding. Allergy: Negative for rash or hives.  Physical Exam   There were no vitals taken for this visit.   General: Well-nourished, well-developed in no acute distress.  Head: Normocephalic, atraumatic.   Eyes: Conjunctiva pink, no icterus. Mouth: Oropharyngeal mucosa moist and pink , no lesions erythema or exudate. Neck: Supple without thyromegaly, masses, or lymphadenopathy.  Lungs: Clear to auscultation bilaterally.  Heart: Regular rate and rhythm, no murmurs rubs or gallops.  Abdomen: Bowel sounds are normal, nontender, nondistended, no hepatosplenomegaly or masses,  no abdominal bruits or hernia, no  rebound or guarding.   Rectal: *** Extremities: No lower extremity edema. No clubbing or deformities.  Neuro: Alert and oriented x 4 , grossly normal neurologically.  Skin: Warm and dry, no rash or jaundice.   Psych: Alert and cooperative, normal mood and affect.  Labs   Lab Results  Component Value Date   TSH 1.270 08/24/2022   Lab Results  Component Value Date   CREATININE 0.79 08/24/2022   BUN 15 08/24/2022   NA 144 08/24/2022   K 4.7 08/24/2022   CL 104 08/24/2022   CO2 30 (H) 08/24/2022   Lab Results  Component Value Date   ALT 25 08/24/2022   AST 21 08/24/2022   ALKPHOS 111 08/24/2022   BILITOT <0.2 08/24/2022   Lab Results  Component Value Date   WBC 6.3 08/24/2022   HGB 12.0 08/24/2022   HCT 36.7 08/24/2022  MCV 94 08/24/2022   PLT 240 08/24/2022    Imaging Studies   No results found.  Assessment       PLAN   ***   Leanna Battles. Melvyn Neth, MHS, PA-C [facility]

## 2022-09-22 ENCOUNTER — Telehealth: Payer: Self-pay

## 2022-09-22 NOTE — Telephone Encounter (Signed)
Chart review completed for patient. Patient is due for screening mammogram. No answer and no voice mail Carla Bell, [employer].

## 2022-09-30 ENCOUNTER — Telehealth: Payer: Self-pay | Admitting: Family Medicine

## 2022-09-30 DIAGNOSIS — J449 Chronic obstructive pulmonary disease, unspecified: Secondary | ICD-10-CM

## 2022-09-30 DIAGNOSIS — Z148 Genetic carrier of other disease: Secondary | ICD-10-CM

## 2022-09-30 MED ORDER — TRELEGY ELLIPTA 100-62.5-25 MCG/ACT IN AEPB
1.0000 | INHALATION_SPRAY | Freq: Every day | RESPIRATORY_TRACT | 0 refills | Status: DC
Start: 2022-09-30 — End: 2022-12-28

## 2022-09-30 NOTE — Telephone Encounter (Signed)
Just checked and we do not have any samples.

## 2022-09-30 NOTE — Telephone Encounter (Signed)
 Patient aware and verbalizes understanding. 

## 2022-09-30 NOTE — Telephone Encounter (Signed)
No samples today. Rx sent

## 2022-10-01 ENCOUNTER — Other Ambulatory Visit: Payer: Self-pay | Admitting: Family Medicine

## 2022-10-01 DIAGNOSIS — Z5181 Encounter for therapeutic drug level monitoring: Secondary | ICD-10-CM | POA: Diagnosis not present

## 2022-10-01 DIAGNOSIS — G8929 Other chronic pain: Secondary | ICD-10-CM

## 2022-10-07 ENCOUNTER — Ambulatory Visit (INDEPENDENT_AMBULATORY_CARE_PROVIDER_SITE_OTHER): Payer: Medicaid Other | Admitting: Gastroenterology

## 2022-10-07 ENCOUNTER — Encounter: Payer: Self-pay | Admitting: Gastroenterology

## 2022-10-07 DIAGNOSIS — K589 Irritable bowel syndrome without diarrhea: Secondary | ICD-10-CM | POA: Diagnosis not present

## 2022-10-07 DIAGNOSIS — K2950 Unspecified chronic gastritis without bleeding: Secondary | ICD-10-CM | POA: Diagnosis not present

## 2022-10-07 MED ORDER — PANTOPRAZOLE SODIUM 40 MG PO TBEC: 40.0000 mg | DELAYED_RELEASE_TABLET | Freq: Two times a day (BID) | ORAL | 3 refills | Status: DC

## 2022-10-07 MED ORDER — DICYCLOMINE HCL 10 MG PO CAPS: 20.0000 mg | ORAL_CAPSULE | Freq: Every day | ORAL | 3 refills | Status: DC

## 2022-10-07 NOTE — Progress Notes (Signed)
Gastroenterology Office Note    Referring Provider: Gabriel Earing, FNP Primary Care Physician:  Gabriel Earing, FNP  Primary GI: Dr. Levon Hedger, previously [facility] (Dr Orvan Falconer)   Chief Complaint   Chief Complaint  Patient presents with   New Patient (Initial Visit)    Patient here today to establish care with a gastroenterology office. Patient has a history of gerd and is taking pantoprazole 40 mg bid, this does control her symptoms, and she takes bentyl 20 mg at bedtime due to stomach cramps and this too is controlling her abdominal cramping. She will need refills today sent to [facility].     History of Present Illness   Carla Bell is a 62 y.o. female presenting today at the request of Gabriel Earing, FNP due to establish GI care locally. She has previously been seen by [facility] (Dr. Orvan Falconer). Her daughter is also a patient here. Patient reports an interesting prior history of U, diagnosed at age 74 but no medications. She [place] is unclear if any testing has been done. EGD/colonoscopy in 2022 by Dr. Orvan Falconer as noted below. Felt not to have history of IBD or celiac disease but recommending to continue gluten avoidance. Current GI issues include chronic constipation, chronic abdominal pain, and GERD.    She had an EGD and colonoscopy 05/05/20. EGD showed a Hill grade 3 hiatal hernia and gastritis. Gastric biopsy showed mild chronic gastritis. There was no H. pylori. Duodenal biopsies were normal. Colonoscopy performed to the terminal ileum revealed nonbleeding internal hemorrhoids, pancolonic diverticulosis, 3 small tubular adenomas, and was otherwise normal. Random colon biopsies were normal.   Constipation: miralax daily. Failed linzess  in past. Doesn't want to change. She feels miralax is working well for her.   Dicyclomine 2 in evening 20 mg total. and pantoprazole BID. Started at other GI  office. She is aware that dicyclomine can contribute to constipation but feels this has helped with chronic abdominal cramping.   GERD controlled on pantoprazole BID.  She is stable from a GI standpoint and requesting only refills.     Past Medical History:  Diagnosis Date   Alpha-1-antitrypsin deficiency (HCC)    Asthma    Bowel obstruction (HCC)    Celiac disease    Colon polyps    COPD (chronic obstructive pulmonary disease) (HCC)    Crohn's colitis (HCC)    Gallstones    IBS (irritable bowel syndrome)    Ovarian cancer (HCC) 1990   Pericardial effusion 07/09/2015   Pleuritic chest pain 07/09/2015   Rotator cuff tear    left    Past Surgical History:  Procedure Laterality Date   ABDOMINAL HYSTERECTOMY     1989   BUNIONECTOMY WITH HAMMERTOE RECONSTRUCTION Right    CHOLECYSTECTOMY     COLONOSCOPY  04/25/2020   Tressia Danas,    TONSILLECTOMY      Current Outpatient Medications  Medication Sig Dispense Refill   albuterol (PROVENTIL) (2.5 MG/3ML) 0.083% nebulizer solution Take 3 mLs (2.5 mg total) by nebulization every 4 (four) hours as needed for wheezing or shortness of breath. 75 mL 6   albuterol (VENTOLIN HFA) 108 (90 Base) MCG/ACT inhaler Inhale 2 puffs into the lungs every 6 (six) hours as needed for wheezing or shortness of breath. 8 g 2   citalopram (CELEXA) 20 MG tablet Take 1 tablet (20 mg total) by mouth daily. 90 tablet 3  dicyclomine (BENTYL) 10 MG capsule TAKE 1 CAPSULE BY MOUTH 4 TIMES DAILY BEFORE MEAL(S) AND AT BEDTIME 120 capsule 1   Fluticasone-Umeclidin-Vilant (TRELEGY ELLIPTA) 100-62.5-25 MCG/ACT AEPB Inhale 1 puff into the lungs daily. 1 each 0   gabapentin (NEURONTIN) 300 MG capsule Take 2 capsules (600 mg total) by mouth at bedtime. 180 capsule 3   meloxicam (MOBIC) 15 MG tablet Take 1 tablet by mouth once daily 30 tablet 1   pantoprazole (PROTONIX) 40 MG tablet Take 1 tablet (40 mg total) by mouth 2 (two) times daily. 90 tablet 1   Vitamin D,  Ergocalciferol, (DRISDOL) 1.25 MG (50000 UNIT) CAPS capsule Take 1 capsule (50,000 Units total) by mouth every 7 (seven) days. (Patient not taking: Reported on 10/07/2022) 8 capsule 0   No current facility-administered medications for this visit.    Allergies as of 10/07/2022 - Review Complete 10/07/2022  Allergen Reaction Noted   Demerol [meperidine] Nausea Only 12/16/2016    Family History  Problem Relation Age of Onset   Alpha-1 antitrypsin deficiency Mother    Alpha-1 antitrypsin deficiency Daughter    Colon cancer Neg Hx    Rectal cancer Neg Hx    Stomach cancer Neg Hx    Prostate cancer Neg Hx     Social History   Socioeconomic History   Marital status: Married    Spouse name: Not on file   Number of children: 3   Years of education: 14   Highest education level: Not on file  Occupational History   Occupation: Research scientist (physical sciences)  Tobacco Use   Smoking status: Former    Current packs/day: 0.00    Average packs/day: 0.3 packs/day for 2.0 years (0.5 ttl pk-yrs)    Types: Cigarettes    Start date: 03/16/1977    Quit date: 03/17/1979    Years since quitting: 43.5   Smokeless tobacco: Never  Vaping Use   Vaping status: Never Used  Substance and Sexual Activity   Alcohol use: No    Alcohol/week: 0.0 standard drinks of alcohol   Drug use: No   Sexual activity: Not on file  Other Topics Concern   Not on file  Social History Narrative   Lives with husband in a 2 story home.  Has 3 children.  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  Education: 2 years of college.   Social Determinants of Health   Financial Resource Strain: Low Risk  (02/11/2021)   Received from [facility], [facility]   Overall Financial Resource Strain (CARDIA)    Difficulty of Paying Living Expenses: Not very hard  Food Insecurity: No Food Insecurity (02/11/2021)   Received from [facility], [facility]   Hunger Vital Sign    Worried About Running Out of Food in the Last  Year: Never true    Ran Out of Food in the Last Year: Never true  Transportation Needs: No Transportation Needs (02/11/2021)   Received from [facility], [facility]   [facility] - Transportation    Lack of Transportation (Medical): No    Lack of Transportation (Non-Medical): No  Physical Activity: Sufficiently Active (02/11/2021)   Received from [facility], [facility]   Exercise Vital Sign    Days of Exercise per Week: 5 days    Minutes of Exercise per Session: 30 min  Stress: No Stress Concern Present (02/11/2021)   Received from [facility], [facility]   [facility] - Occupational Stress Questionnaire    Feeling of Stress : Only a little  Social Connections: Socially Integrated (02/11/2021)   Received from [facility], [facility]   Social Connection and Isolation Panel [NHANES]    Frequency of Communication with Friends and Family: More than three times a week    Frequency of Social Gatherings with Friends and Family: More than three times a week    Attends Religious Services: More than 4 times per year    Active Member of [employer] or Organizations: Yes    Attends Engineer, structural: More than 4 times per year    Marital Status: Married  Catering manager Violence: Not At Risk (02/11/2021)   Received from [facility], [facility]   Humiliation, Afraid, Rape, and Kick questionnaire    Fear of Current or Ex-Partner: No    Emotionally Abused: No    Physically Abused: No    Sexually Abused: No     Review of Systems   Gen: Denies any fever, chills, fatigue, weight loss, lack of appetite.  CV: Denies chest pain, heart palpitations, peripheral edema, syncope.  Resp: Denies shortness of breath at rest or with exertion. Denies wheezing or cough.  GI: Denies dysphagia or odynophagia. Denies jaundice, hematemesis, fecal incontinence. GU : Denies urinary burning, urinary frequency, urinary hesitancy MS:  Denies joint pain, muscle weakness, cramps, or limitation of movement.  Derm: Denies rash, itching, dry skin Psych: Denies depression, anxiety, memory loss, and confusion Heme: Denies bruising, bleeding, and enlarged lymph nodes.   Physical Exam   BP (!) 150/81 (BP Location: Left Arm, Patient Position: Sitting, Cuff Size: Normal)   Pulse 80   Temp 97.8 F (36.6 C) (Temporal)   Ht 5\' 8"  (1.727 m)   Wt 176 lb 14.4 oz (80.2 kg)   BMI 26.90 kg/m  General:   Alert and oriented. Pleasant and cooperative. Well-nourished and well-developed.  Head:  Normocephalic and atraumatic. Eyes:  Without icterus Ears:  Normal auditory acuity. Lungs:  Clear to auscultation bilaterally.  Heart:  S1, S2 present without murmurs appreciated.  Abdomen:  +BS, soft, non-tender and non-distended. No HSM noted. No guarding or rebound. No masses appreciated.  Rectal:  Deferred  Msk:  Symmetrical without gross deformities. Normal posture. Extremities:  Without edema. Neurologic:  Alert and  oriented x4;  grossly normal neurologically. Skin:  Intact without significant lesions or rashes. Psych:  Alert and cooperative. Normal mood and affect.   Assessment   KRUZ ABONCE is a 62 y.o. female presenting today at the request of PCP to establish GI care for chronic constipation, chronic abdominal pain, and GERD. Previously, she was a patient at [facility] and followed by Dr. Orvan Falconer. There was a reported history of UC and celiac disease per patient, but EGD and colonoscopy on file without evidence for this.   Constipation: managed well with Miralax. She has failed Linzess and desires to continue Miralax.  GERD: doing well with pantoprazole BID. EGD on file from 2022, no barrett's.   Chronic abdominal pain: taking dicyclomine at bedtime. We discussed this can contribute to constipation. However, she would like to continue with this, which was started by previous GI.   She has no concerns today and stable for  follow-up in 1 year. Colonoscopy will be due in 2025 due to history of adenomas.    PLAN  Pantoprazole BID. Refills provided Dicyclomine refills provided Colonoscopy 2025 Return 1 year or sooner if needed  Gelene Mink, PhD, ANP-BC [facility]

## 2022-10-07 NOTE — Patient Instructions (Signed)
I have refilled dicyclomine and pantoprazole for you!  Please call if any concerns!  It was nice to meet you today!  It was a pleasure to see you today. I want to create trusting relationships with patients and provide genuine, compassionate, and quality care. I truly value your feedback, so please be on the lookout for a survey regarding your visit with me today. I appreciate your time in completing this!         Gelene Mink, PhD, ANP-BC [facility]

## 2022-10-08 ENCOUNTER — Encounter: Payer: Self-pay | Admitting: Gastroenterology

## 2022-10-08 DIAGNOSIS — Z5181 Encounter for therapeutic drug level monitoring: Secondary | ICD-10-CM | POA: Diagnosis not present

## 2022-10-15 DIAGNOSIS — Z419 Encounter for procedure for purposes other than remedying health state, unspecified: Secondary | ICD-10-CM | POA: Diagnosis not present

## 2022-10-15 DIAGNOSIS — Z79899 Other long term (current) drug therapy: Secondary | ICD-10-CM | POA: Diagnosis not present

## 2022-10-15 DIAGNOSIS — Z5181 Encounter for therapeutic drug level monitoring: Secondary | ICD-10-CM | POA: Diagnosis not present

## 2022-10-21 ENCOUNTER — Encounter: Payer: Self-pay | Admitting: Family Medicine

## 2022-10-21 ENCOUNTER — Ambulatory Visit: Payer: Medicaid Other | Admitting: Family Medicine

## 2022-10-27 ENCOUNTER — Inpatient Hospital Stay: Admission: RE | Admit: 2022-10-27 | Payer: Medicaid Other | Source: Ambulatory Visit

## 2022-10-29 DIAGNOSIS — Z79899 Other long term (current) drug therapy: Secondary | ICD-10-CM | POA: Diagnosis not present

## 2022-10-29 DIAGNOSIS — Z5181 Encounter for therapeutic drug level monitoring: Secondary | ICD-10-CM | POA: Diagnosis not present

## 2022-11-12 DIAGNOSIS — Z5181 Encounter for therapeutic drug level monitoring: Secondary | ICD-10-CM | POA: Diagnosis not present

## 2022-11-15 DIAGNOSIS — Z419 Encounter for procedure for purposes other than remedying health state, unspecified: Secondary | ICD-10-CM | POA: Diagnosis not present

## 2022-11-26 DIAGNOSIS — Z5181 Encounter for therapeutic drug level monitoring: Secondary | ICD-10-CM | POA: Diagnosis not present

## 2022-12-03 ENCOUNTER — Other Ambulatory Visit: Payer: Self-pay | Admitting: Family Medicine

## 2022-12-03 DIAGNOSIS — G8929 Other chronic pain: Secondary | ICD-10-CM

## 2022-12-10 DIAGNOSIS — Z79899 Other long term (current) drug therapy: Secondary | ICD-10-CM | POA: Diagnosis not present

## 2022-12-10 DIAGNOSIS — Z5181 Encounter for therapeutic drug level monitoring: Secondary | ICD-10-CM | POA: Diagnosis not present

## 2022-12-24 DIAGNOSIS — F411 Generalized anxiety disorder: Secondary | ICD-10-CM | POA: Diagnosis not present

## 2022-12-24 DIAGNOSIS — Z5181 Encounter for therapeutic drug level monitoring: Secondary | ICD-10-CM | POA: Diagnosis not present

## 2022-12-24 DIAGNOSIS — Z79899 Other long term (current) drug therapy: Secondary | ICD-10-CM | POA: Diagnosis not present

## 2022-12-28 ENCOUNTER — Encounter: Payer: Self-pay | Admitting: Family Medicine

## 2022-12-28 ENCOUNTER — Ambulatory Visit: Payer: Medicaid Other | Admitting: Family Medicine

## 2022-12-28 VITALS — BP 164/98 | HR 72 | Temp 97.8°F | Resp 20 | Ht 68.0 in | Wt 177.0 lb

## 2022-12-28 DIAGNOSIS — J441 Chronic obstructive pulmonary disease with (acute) exacerbation: Secondary | ICD-10-CM

## 2022-12-28 DIAGNOSIS — R0981 Nasal congestion: Secondary | ICD-10-CM

## 2022-12-28 DIAGNOSIS — J449 Chronic obstructive pulmonary disease, unspecified: Secondary | ICD-10-CM | POA: Diagnosis not present

## 2022-12-28 DIAGNOSIS — Z148 Genetic carrier of other disease: Secondary | ICD-10-CM | POA: Diagnosis not present

## 2022-12-28 DIAGNOSIS — R040 Epistaxis: Secondary | ICD-10-CM

## 2022-12-28 DIAGNOSIS — I10 Essential (primary) hypertension: Secondary | ICD-10-CM | POA: Insufficient documentation

## 2022-12-28 DIAGNOSIS — R519 Headache, unspecified: Secondary | ICD-10-CM | POA: Diagnosis not present

## 2022-12-28 DIAGNOSIS — E559 Vitamin D deficiency, unspecified: Secondary | ICD-10-CM | POA: Diagnosis not present

## 2022-12-28 MED ORDER — AMLODIPINE BESYLATE 5 MG PO TABS
5.0000 mg | ORAL_TABLET | Freq: Every day | ORAL | 3 refills | Status: DC
Start: 2022-12-28 — End: 2024-02-03

## 2022-12-28 MED ORDER — TRELEGY ELLIPTA 100-62.5-25 MCG/ACT IN AEPB: 1.0000 | INHALATION_SPRAY | Freq: Every day | RESPIRATORY_TRACT | 0 refills | Status: DC

## 2022-12-28 MED ORDER — ALBUTEROL SULFATE HFA 108 (90 BASE) MCG/ACT IN AERS
2.0000 | INHALATION_SPRAY | Freq: Four times a day (QID) | RESPIRATORY_TRACT | 2 refills | Status: DC | PRN
Start: 2022-12-28 — End: 2023-01-04

## 2022-12-28 MED ORDER — ALBUTEROL SULFATE (2.5 MG/3ML) 0.083% IN NEBU: 2.5000 mg | INHALATION_SOLUTION | RESPIRATORY_TRACT | 6 refills | Status: AC | PRN

## 2022-12-28 MED ORDER — LEVOCETIRIZINE DIHYDROCHLORIDE 5 MG PO TABS
5.0000 mg | ORAL_TABLET | Freq: Every evening | ORAL | 3 refills | Status: DC
Start: 2022-12-28 — End: 2023-02-25

## 2022-12-28 NOTE — Progress Notes (Signed)
Acute Office Visit  Subjective:     Patient ID: Carla Bell, female    DOB: February 01, 1961, 62 y.o.   MRN: [id number]  Chief Complaint  Patient presents with   nosebleeds    Concerned it may blood pressure    Headache    HPI Patient is in today for COPD. She has been out of her inhalers since June. She reports increased shortness of breath and wheezing with activity. She reports cough with clear phlegm. She has been doing her nebulizer treatments multiple times a day for 3 weeks. She has an appt to reestablish with pulmonology in a few weeks.   She has also had some nose bleeds on the left side for 2 weeks. This has happened 6 times in the last 2 weeks, with last occurrence 2 days ago. Resolves after a few minutes of pressure and ice. She has had a lot of nasal congestion.   BP yesterday was 169/88 at home. She has been having some headaches (right frontal) and dizziness. Dizziness occurs occasionally with bending or standing.   She has had increased anxiety and stress recently due to health concerns with her daughter and concerns regarding her dog. She is now established with [facility]. They increased her celexa and gabapentin a few days ago.       12/28/2022    2:40 PM 08/24/2022    2:02 PM 05/14/2022    2:07 PM  Depression screen PHQ 2/9  Decreased Interest 0 0 0  Down, Depressed, Hopeless 0 0 0  PHQ - 2 Score 0 0 0  Altered sleeping 0 0 0  Tired, decreased energy 0 2 1  Change in appetite 0 0 0  Feeling bad or failure about yourself  0 0 0  Trouble concentrating 0 1 0  Moving slowly or fidgety/restless 0 0 0  Suicidal thoughts 0 0 0  PHQ-9 Score 0 3 1  Difficult doing work/chores Not difficult at all Not difficult at all Not difficult at all      12/28/2022    2:40 PM 08/24/2022    2:02 PM 05/14/2022    2:09 PM 10/08/2021    1:18 PM  GAD 7 : Generalized Anxiety Score  Nervous, Anxious, on Edge 0 0 0 1  Control/stop worrying 0 0 0 0  Worry too much - different  things 0 0 0 1  Trouble relaxing 0 0 0 1  Restless 0 0 0 1  Easily annoyed or irritable 0 0 0 0  Afraid - awful might happen 0 0 0 0  Total GAD 7 Score 0 0 0 4  Anxiety Difficulty Not difficult at all Not difficult at all Not difficult at all Not difficult at all       ROS As per HPI.      Objective:    BP (!) 164/98   Pulse 72   Temp 97.8 F (36.6 C) (Temporal)   Resp 20   Ht 5\' 8"  (1.727 m)   Wt 177 lb (80.3 kg)   SpO2 90%   BMI 26.91 kg/m  BP Readings from Last 3 Encounters:  12/28/22 (!) 164/98  10/07/22 (!) 150/81  08/24/22 138/71      Physical Exam Vitals and nursing note reviewed.  Constitutional:      General: She is not in acute distress.    Appearance: Normal appearance. She is not ill-appearing, toxic-appearing or diaphoretic.  HENT:     Head: Normocephalic and atraumatic.  Nose: Mucosal edema and congestion present. No septal deviation, signs of injury, laceration or rhinorrhea.     Right Nostril: No foreign body, epistaxis, septal hematoma or occlusion.     Left Nostril: No foreign body, epistaxis, septal hematoma or occlusion.     Mouth/Throat:     Mouth: Mucous membranes are moist.     Pharynx: Oropharynx is clear. No oropharyngeal exudate or posterior oropharyngeal erythema.  Eyes:     General:        Right eye: No discharge.        Left eye: No discharge.     Extraocular Movements: Extraocular movements intact.     Pupils: Pupils are equal, round, and reactive to light.  Cardiovascular:     Rate and Rhythm: Normal rate and regular rhythm.     Heart sounds: Normal heart sounds. No murmur heard. Pulmonary:     Effort: Pulmonary effort is normal.     Breath sounds: Examination of the right-upper field reveals wheezing. Examination of the left-upper field reveals wheezing. Wheezing present. No rhonchi or rales.  Abdominal:     General: Bowel sounds are normal. There is no distension.     Palpations: Abdomen is soft.     Tenderness: There  is no abdominal tenderness. There is no guarding or rebound.  Musculoskeletal:     Cervical back: Neck supple. No rigidity.     Right lower leg: No edema.     Left lower leg: No edema.  Skin:    General: Skin is warm and dry.  Neurological:     General: No focal deficit present.     Mental Status: She is alert and oriented to person, place, and time.  Psychiatric:        Mood and Affect: Mood normal.        Behavior: Behavior normal.     No results found for any visits on 12/28/22.      Assessment & Plan:   Carla Bell was seen today for nosebleeds and headache.  Diagnoses and all orders for this visit:  COPD with acute exacerbation (HCC) Will restart her on her maintenance regimen. Discussed prednisone burst if no improvement in a few days. Keep scheduled appt with pulmonology.  -     Fluticasone-Umeclidin-Vilant (TRELEGY ELLIPTA) 100-62.5-25 MCG/ACT AEPB; Inhale 1 puff into the lungs daily. -     albuterol (PROVENTIL) (2.5 MG/3ML) 0.083% nebulizer solution; Take 3 mLs (2.5 mg total) by nebulization every 4 (four) hours as needed for wheezing or shortness of breath. -     albuterol (VENTOLIN HFA) 108 (90 Base) MCG/ACT inhaler; Inhale 2 puffs into the lungs every 6 (six) hours as needed for wheezing or shortness of breath.  COPD GOLD II/ MZ/ quit smoking 1981  -     Fluticasone-Umeclidin-Vilant (TRELEGY ELLIPTA) 100-62.5-25 MCG/ACT AEPB; Inhale 1 puff into the lungs daily. -     albuterol (PROVENTIL) (2.5 MG/3ML) 0.083% nebulizer solution; Take 3 mLs (2.5 mg total) by nebulization every 4 (four) hours as needed for wheezing or shortness of breath. -     albuterol (VENTOLIN HFA) 108 (90 Base) MCG/ACT inhaler; Inhale 2 puffs into the lungs every 6 (six) hours as needed for wheezing or shortness of breath.  Alpha-1-antitrypsin deficiency carrier -     Fluticasone-Umeclidin-Vilant (TRELEGY ELLIPTA) 100-62.5-25 MCG/ACT AEPB; Inhale 1 puff into the lungs daily.  Primary  hypertension Will star amlodipine as below. Labs pending. Will recheck BP in 2 weeks -     CBC  with Differential/Platelet  -     BMP8+EGFR -     amLODipine (NORVASC) 5 MG tablet; Take 1 tablet (5 mg total) by mouth daily. -     TSH  Nasal congestion Sinus headache Xyzal as below. -     levocetirizine (XYZAL) 5 MG tablet; Take 1 tablet (5 mg total) by mouth every evening.  Epitaxis Discussed likely from rhinitis. Use humidifier. Pressure and afrin prn for bleeding. Discussed when to seek emergency care.   Vitamin D deficiency On supplement. Labs pending.  -     VITAMIN D 25 Hydroxy (Vit-D Deficiency, Fractures)  Return in about 2 weeks (around 01/11/2023) for medication follow up.  The patient indicates understanding of these issues and agrees with the plan.   Gabriel Earing, FNP

## 2022-12-29 LAB — VITAMIN D 25 HYDROXY (VIT D DEFICIENCY, FRACTURES): Vit D, 25-Hydroxy: 38.5 ng/mL (ref 30.0–100.0)

## 2022-12-29 LAB — CBC WITH DIFFERENTIAL/PLATELET
Basophils Absolute: 0 10*3/uL (ref 0.0–0.2)
Basos: 0 %
EOS (ABSOLUTE): 0.5 10*3/uL — ABNORMAL HIGH (ref 0.0–0.4)
Eos: 8 %
Hematocrit: 38.2 % (ref 34.0–46.6)
Hemoglobin: 12.6 g/dL (ref 11.1–15.9)
Immature Grans (Abs): 0 10*3/uL (ref 0.0–0.1)
Immature Granulocytes: 0 %
Lymphocytes Absolute: 1.3 10*3/uL (ref 0.7–3.1)
Lymphs: 19 %
MCH: 30.7 pg (ref 26.6–33.0)
MCHC: 33 g/dL (ref 31.5–35.7)
MCV: 93 fL (ref 79–97)
Monocytes Absolute: 0.3 10*3/uL (ref 0.1–0.9)
Monocytes: 5 %
Neutrophils Absolute: 4.4 10*3/uL (ref 1.4–7.0)
Neutrophils: 68 %
Platelets: 218 10*3/uL (ref 150–450)
RBC: 4.1 x10E6/uL (ref 3.77–5.28)
RDW: 11.9 % (ref 11.7–15.4)
WBC: 6.5 10*3/uL (ref 3.4–10.8)

## 2022-12-29 LAB — BMP8+EGFR
BUN/Creatinine Ratio: 18 (ref 12–28)
BUN: 14 mg/dL (ref 8–27)
CO2: 28 mmol/L (ref 20–29)
Calcium: 8.8 mg/dL (ref 8.7–10.3)
Chloride: 101 mmol/L (ref 96–106)
Creatinine, Ser: 0.79 mg/dL (ref 0.57–1.00)
Glucose: 98 mg/dL (ref 70–99)
Potassium: 4.1 mmol/L (ref 3.5–5.2)
Sodium: 143 mmol/L (ref 134–144)
eGFR: 84 mL/min/{1.73_m2} (ref >59)

## 2022-12-29 LAB — TSH: TSH: 1.32 u[IU]/mL (ref 0.450–4.500)

## 2023-01-04 ENCOUNTER — Encounter: Payer: Self-pay | Admitting: Pulmonary Disease

## 2023-01-04 ENCOUNTER — Ambulatory Visit (INDEPENDENT_AMBULATORY_CARE_PROVIDER_SITE_OTHER): Payer: Medicaid Other | Admitting: Pulmonary Disease

## 2023-01-04 ENCOUNTER — Ambulatory Visit: Payer: Medicaid Other

## 2023-01-04 VITALS — BP 170/91 | HR 68 | Temp 97.8°F | Ht 68.0 in | Wt 179.8 lb

## 2023-01-04 DIAGNOSIS — I700 Atherosclerosis of aorta: Secondary | ICD-10-CM | POA: Diagnosis not present

## 2023-01-04 DIAGNOSIS — Z148 Genetic carrier of other disease: Secondary | ICD-10-CM | POA: Diagnosis not present

## 2023-01-04 DIAGNOSIS — J439 Emphysema, unspecified: Secondary | ICD-10-CM | POA: Diagnosis not present

## 2023-01-04 DIAGNOSIS — J441 Chronic obstructive pulmonary disease with (acute) exacerbation: Secondary | ICD-10-CM

## 2023-01-04 DIAGNOSIS — R0602 Shortness of breath: Secondary | ICD-10-CM | POA: Diagnosis not present

## 2023-01-04 DIAGNOSIS — J449 Chronic obstructive pulmonary disease, unspecified: Secondary | ICD-10-CM | POA: Diagnosis not present

## 2023-01-04 MED ORDER — FLUCONAZOLE 100 MG PO TABS: 100.0000 mg | ORAL_TABLET | Freq: Every day | ORAL | 0 refills | Status: AC

## 2023-01-04 MED ORDER — TRELEGY ELLIPTA 100-62.5-25 MCG/ACT IN AEPB: 1.0000 | INHALATION_SPRAY | Freq: Every day | RESPIRATORY_TRACT | 5 refills | Status: DC

## 2023-01-04 MED ORDER — ALBUTEROL SULFATE HFA 108 (90 BASE) MCG/ACT IN AERS
2.0000 | INHALATION_SPRAY | Freq: Four times a day (QID) | RESPIRATORY_TRACT | 2 refills | Status: DC | PRN
Start: 2023-01-04 — End: 2024-02-03

## 2023-01-04 NOTE — Progress Notes (Signed)
Carla Bell    [id number]    02/07/1961  Primary Care Physician:Morgan, Birder Robson, FNP  Referring Physician: Gabriel Earing, FNP [address] [place],  [place] [address]  Chief complaint:   Patient with a history of alpha-1 deficiency Shortness of breath on exertion  HPI:  Recently was out of inhalers, uses Trelegy  When she uses her inhalers regularly, she is less symptomatic, able to function well, with not using inhalers recently has had significant issues  History of alpha 1 antitrypsin deficiency  Tries to stay active  Remote history of social smoking  When she was out of Trelegy, was using nebulization treatments and MDI frequently  Does have some soreness of the back of her throat, feels she has thrush  She does rinse regularly following use of inhalers  Outpatient Encounter Medications as of 01/04/2023  Medication Sig   albuterol (PROVENTIL) (2.5 MG/3ML) 0.083% nebulizer solution Take 3 mLs (2.5 mg total) by nebulization every 4 (four) hours as needed for wheezing or shortness of breath.   amLODipine (NORVASC) 5 MG tablet Take 1 tablet (5 mg total) by mouth daily.   citalopram (CELEXA) 20 MG tablet Take 1 tablet (20 mg total) by mouth daily.   dicyclomine (BENTYL) 10 MG capsule Take 2 capsules (20 mg total) by mouth at bedtime. TAKE 1 CAPSULE BY MOUTH 4 TIMES DAILY BEFORE MEAL(S) AND AT BEDTIME   fluconazole (DIFLUCAN) 100 MG tablet Take 1 tablet (100 mg total) by mouth daily for 7 days. Take 2 tablets on the first day and then 1 daily   gabapentin (NEURONTIN) 300 MG capsule Take 2 capsules (600 mg total) by mouth at bedtime.   levocetirizine (XYZAL) 5 MG tablet Take 1 tablet (5 mg total) by mouth every evening.   meloxicam (MOBIC) 15 MG tablet Take 1 tablet by mouth once daily   pantoprazole (PROTONIX) 40 MG tablet Take 1 tablet (40 mg total) by mouth 2 (two) times daily.   Vitamin D, Ergocalciferol, (DRISDOL) 1.25 MG (50000 UNIT) CAPS capsule  Take 1 capsule (50,000 Units total) by mouth every 7 (seven) days.   [DISCONTINUED] albuterol (VENTOLIN HFA) 108 (90 Base) MCG/ACT inhaler Inhale 2 puffs into the lungs every 6 (six) hours as needed for wheezing or shortness of breath.   [DISCONTINUED] Fluticasone-Umeclidin-Vilant (TRELEGY ELLIPTA) 100-62.5-25 MCG/ACT AEPB Inhale 1 puff into the lungs daily.   albuterol (VENTOLIN HFA) 108 (90 Base) MCG/ACT inhaler Inhale 2 puffs into the lungs every 6 (six) hours as needed for wheezing or shortness of breath.   Fluticasone-Umeclidin-Vilant (TRELEGY ELLIPTA) 100-62.5-25 MCG/ACT AEPB Inhale 1 puff into the lungs daily.   No facility-administered encounter medications on file as of 01/04/2023.    Allergies as of 01/04/2023 - Review Complete 01/04/2023  Allergen Reaction Noted   Demerol [meperidine] Nausea Only 12/16/2016    Past Medical History:  Diagnosis Date   Alpha-1-antitrypsin deficiency (HCC)    Asthma    Bowel obstruction (HCC)    Celiac disease    Colon polyps    COPD (chronic obstructive pulmonary disease) (HCC)    Gallstones    IBS (irritable bowel syndrome)    Ovarian cancer (HCC) 1990   Pericardial effusion 07/09/2015   Pleuritic chest pain 07/09/2015   Rotator cuff tear    left   UC (ulcerative colitis) (HCC)     Past Surgical History:  Procedure Laterality Date   ABDOMINAL HYSTERECTOMY     1989  BUNIONECTOMY WITH HAMMERTOE RECONSTRUCTION Right    CHOLECYSTECTOMY     COLONOSCOPY  04/25/2020   Tressia Danas,    TONSILLECTOMY      Family History  Problem Relation Age of Onset   Alpha-1 antitrypsin deficiency Mother    Alpha-1 antitrypsin deficiency Daughter    Colon cancer Neg Hx    Rectal cancer Neg Hx    Stomach cancer Neg Hx    Prostate cancer Neg Hx     Social History   Socioeconomic History   Marital status: Married    Spouse name: Not on file   Number of children: 3   Years of education: 14   Highest education level: Not on file   Occupational History   Occupation: Research scientist (physical sciences)  Tobacco Use   Smoking status: Former    Current packs/day: 0.00    Average packs/day: 0.3 packs/day for 2.0 years (0.5 ttl pk-yrs)    Types: Cigarettes    Start date: 03/16/1977    Quit date: 03/17/1979    Years since quitting: 43.8   Smokeless tobacco: Never  Vaping Use   Vaping status: Former   Start date: 05/15/2022   Quit date: 07/15/2022  Substance and Sexual Activity   Alcohol use: No    Alcohol/week: 0.0 standard drinks of alcohol   Drug use: Yes    Types: Other-see comments    Comment: [facility] "smoke 1 daily"   Sexual activity: Not on file  Other Topics Concern   Not on file  Social History Narrative   Lives with husband in a 2 story home.  Has 3 children.  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  Education: 2 years of college.   Social Determinants of Health   Financial Resource Strain: Low Risk  (02/11/2021)   Received from [facility], [facility]   Overall Financial Resource Strain (CARDIA)    Difficulty of Paying Living Expenses: Not very hard  Food Insecurity: No Food Insecurity (02/11/2021)   Received from [facility], [facility]   Hunger Vital Sign    Worried About Running Out of Food in the Last Year: Never true    Ran Out of Food in the Last Year: Never true  Transportation Needs: No Transportation Needs (02/11/2021)   Received from [facility], [facility]   [facility] - Transportation    Lack of Transportation (Medical): No    Lack of Transportation (Non-Medical): No  Physical Activity: Sufficiently Active (02/11/2021)   Received from [facility], [facility]   Exercise Vital Sign    Days of Exercise per Week: 5 days    Minutes of Exercise per Session: 30 min  Stress: No Stress Concern Present (02/11/2021)   Received from [facility], [facility] - Occupational Stress Questionnaire    Feeling of Stress : Only a  little  Social Connections: Socially Integrated (02/11/2021)   Received from [facility], [facility]   Social Connection and Isolation Panel [NHANES]    Frequency of Communication with Friends and Family: More than three times a week    Frequency of Social Gatherings with Friends and Family: More than three times a week    Attends Religious Services: More than 4 times per year    Active Member of [employer] or Organizations: Yes    Attends Banker Meetings: More than 4 times per year  Marital Status: Married  Catering manager Violence: Not At Risk (02/11/2021)   Received from [facility], [facility]   Humiliation, Afraid, Rape, and Kick questionnaire    Fear of Current or Ex-Partner: No    Emotionally Abused: No    Physically Abused: No    Sexually Abused: No    Review of Systems  Constitutional:  Negative for fatigue.  Respiratory:  Positive for cough and shortness of breath.   Psychiatric/Behavioral:  Negative for sleep disturbance.     Vitals:   01/04/23 0959 01/04/23 1032  BP: (!) 164/94 (!) 170/91  Pulse: 68   Temp: 97.8 F (36.6 C)   SpO2: 91%      Physical Exam Constitutional:      Appearance: Normal appearance.  HENT:     Head: Normocephalic.     Mouth/Throat:     Mouth: Mucous membranes are moist.  Eyes:     General:        Right eye: No discharge.        Left eye: No discharge.  Cardiovascular:     Rate and Rhythm: Normal rate and regular rhythm.     Pulses: Normal pulses.     Heart sounds: Normal heart sounds. No murmur heard.    No friction rub.  Pulmonary:     Effort: No respiratory distress.     Breath sounds: No stridor. No wheezing or rhonchi.  Musculoskeletal:     Cervical back: No rigidity or tenderness.  Neurological:     Mental Status: She is alert.  Psychiatric:        Mood and Affect: Mood normal.    Data Reviewed: Previous PFT from 2017 reviewed showing mild obstructive lung disease with significant  bronchodilator response Current PFT was reviewed with the patient showing no significant change compared to previous showing mild obstructive disease with no significant bronchodilator response  Previous chest x-ray with hyperinflated lung fields-reviewed by myself  Assessment:  Dyspnea on exertion -Controlled with Trelegy  History of obstructive lung disease -Continue Trelegy  History of alpha 1 deficiency -Last level was 85  Past history of pericardial effusion  Plan/Recommendations: Graded exercise as tolerated  Prescription for Trelegy  Graded activities as tolerated  Obtain chest x-ray  Schedule for pulmonary function test  Tentative follow-up in 3 months   Virl Diamond MD  Pulmonary and Critical Care 01/04/2023, 10:34 AM  CC: Gabriel Earing, FNP

## 2023-01-04 NOTE — Patient Instructions (Signed)
Diflucan for Thrush  Continue Trelegy  Regular exercise as tolerated  I will see you in about 3 months  Schedule for PFT  Chest x-ray today

## 2023-01-07 DIAGNOSIS — Z79899 Other long term (current) drug therapy: Secondary | ICD-10-CM | POA: Diagnosis not present

## 2023-01-07 DIAGNOSIS — F411 Generalized anxiety disorder: Secondary | ICD-10-CM | POA: Diagnosis not present

## 2023-01-07 DIAGNOSIS — Z5181 Encounter for therapeutic drug level monitoring: Secondary | ICD-10-CM | POA: Diagnosis not present

## 2023-01-08 ENCOUNTER — Other Ambulatory Visit (HOSPITAL_COMMUNITY): Payer: Self-pay

## 2023-01-13 ENCOUNTER — Ambulatory Visit: Payer: Medicaid Other | Admitting: Family Medicine

## 2023-01-15 DIAGNOSIS — Z419 Encounter for procedure for purposes other than remedying health state, unspecified: Secondary | ICD-10-CM | POA: Diagnosis not present

## 2023-01-21 DIAGNOSIS — Z79899 Other long term (current) drug therapy: Secondary | ICD-10-CM | POA: Diagnosis not present

## 2023-01-21 DIAGNOSIS — Z5181 Encounter for therapeutic drug level monitoring: Secondary | ICD-10-CM | POA: Diagnosis not present

## 2023-02-14 DIAGNOSIS — Z419 Encounter for procedure for purposes other than remedying health state, unspecified: Secondary | ICD-10-CM | POA: Diagnosis not present

## 2023-02-18 DIAGNOSIS — Z79899 Other long term (current) drug therapy: Secondary | ICD-10-CM | POA: Diagnosis not present

## 2023-02-18 DIAGNOSIS — Z5181 Encounter for therapeutic drug level monitoring: Secondary | ICD-10-CM | POA: Diagnosis not present

## 2023-02-25 ENCOUNTER — Encounter: Payer: Self-pay | Admitting: Family Medicine

## 2023-02-25 ENCOUNTER — Ambulatory Visit: Payer: Medicaid Other | Admitting: Family Medicine

## 2023-02-25 VITALS — BP 121/67 | HR 61 | Temp 97.8°F | Ht 68.0 in | Wt 172.2 lb

## 2023-02-25 DIAGNOSIS — M5441 Lumbago with sciatica, right side: Secondary | ICD-10-CM

## 2023-02-25 DIAGNOSIS — E559 Vitamin D deficiency, unspecified: Secondary | ICD-10-CM | POA: Diagnosis not present

## 2023-02-25 DIAGNOSIS — I10 Essential (primary) hypertension: Secondary | ICD-10-CM | POA: Diagnosis not present

## 2023-02-25 DIAGNOSIS — M722 Plantar fascial fibromatosis: Secondary | ICD-10-CM

## 2023-02-25 DIAGNOSIS — F321 Major depressive disorder, single episode, moderate: Secondary | ICD-10-CM

## 2023-02-25 DIAGNOSIS — J449 Chronic obstructive pulmonary disease, unspecified: Secondary | ICD-10-CM | POA: Diagnosis not present

## 2023-02-25 DIAGNOSIS — K2950 Unspecified chronic gastritis without bleeding: Secondary | ICD-10-CM

## 2023-02-25 DIAGNOSIS — G8929 Other chronic pain: Secondary | ICD-10-CM | POA: Diagnosis not present

## 2023-02-25 DIAGNOSIS — Z23 Encounter for immunization: Secondary | ICD-10-CM

## 2023-02-25 DIAGNOSIS — M5412 Radiculopathy, cervical region: Secondary | ICD-10-CM | POA: Diagnosis not present

## 2023-02-25 DIAGNOSIS — F419 Anxiety disorder, unspecified: Secondary | ICD-10-CM

## 2023-02-25 DIAGNOSIS — E782 Mixed hyperlipidemia: Secondary | ICD-10-CM | POA: Diagnosis not present

## 2023-02-25 MED ORDER — MELOXICAM 15 MG PO TABS
15.0000 mg | ORAL_TABLET | Freq: Every day | ORAL | 0 refills | Status: DC
Start: 2023-02-25 — End: 2023-04-05

## 2023-02-25 NOTE — Progress Notes (Signed)
Established Patient Office Visit  Subjective   Patient ID: Carla Bell, female    DOB: 12-Dec-1960  Age: 62 y.o. MRN: [id number]  Chief Complaint  Patient presents with   Medical Management of Chronic Issues   Hypertension    HPI HTN Complaint with meds - Yes Current Medications - amlodipine Pertinent ROS:  Headache - No Fatigue - No Visual Disturbances - No Chest pain - No Dyspnea - No Palpitations - No LE edema - No  She would like to try to discontinue amlodipine a she has reduced a lot of stress in her life.   2. COPD Saw pulmonology in Oct and had CXR then. Has PFTs scheduled in January. Compliant with trelegy. She hasn't needed albuterol recently.   3. Back pain, neck pain Stable. Denies saddle anesthesia, changes in bowel or bladder control. 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 Currently on subutex through physciatry. On gabapentin with relief. Takes mobic prn as well.   4. Foot pain Right heel pain for a few week. Pain is worse with first few steps after resting or sitting. Has difficulty walking first thing in the morning. She has been doing some stretching without significant relief.   5. Depression/anxiety Now established with psychiatry. Complaint with celexa with good control.      02/25/2023    1:58 PM 12/28/2022    2:40 PM 08/24/2022    2:02 PM  Depression screen PHQ 2/9  Decreased Interest 0 0 0  Down, Depressed, Hopeless 0 0 0  PHQ - 2 Score 0 0 0  Altered sleeping 0 0 0  Tired, decreased energy 0 0 2  Change in appetite 2 0 0  Feeling bad or failure about yourself  0 0 0  Trouble concentrating 2 0 1  Moving slowly or fidgety/restless 0 0 0  Suicidal thoughts 0 0 0  PHQ-9 Score 4 0 3  Difficult doing work/chores Not difficult at all Not difficult at all Not difficult at all      02/25/2023    1:57 PM 12/28/2022    2:40 PM 08/24/2022    2:02 PM 05/14/2022    2:09 PM  GAD 7 : Generalized Anxiety Score  Nervous, Anxious, on Edge 0 0  0 0  Control/stop worrying 0 0 0 0  Worry too much - different things 0 0 0 0  Trouble relaxing 0 0 0 0  Restless 2 0 0 0  Easily annoyed or irritable 0 0 0 0  Afraid - awful might happen 0 0 0 0  Total GAD 7 Score 2 0 0 0  Anxiety Difficulty Very difficult Not difficult at all Not difficult at all Not difficult at all      Optional):23778}  ROS As per HPI.    Objective:     BP 121/67   Pulse 61   Temp 97.8 F (36.6 C) (Temporal)   Ht 5\' 8"  (1.727 m)   Wt 172 lb 4 oz (78.1 kg)   SpO2 92%   BMI 26.19 kg/m  SpO2 Readings from Last 3 Encounters:  02/25/23 92%  01/04/23 91%  12/28/22 90%      Physical Exam Vitals and nursing note reviewed.  Constitutional:      General: She is not in acute distress.    Appearance: She is not ill-appearing, toxic-appearing or diaphoretic.  Eyes:     General:        Right eye: No discharge.  Left eye: No discharge.     Conjunctiva/sclera: Conjunctivae normal.  Cardiovascular:     Rate and Rhythm: Normal rate and regular rhythm.     Heart sounds: Normal heart sounds. No murmur heard. Pulmonary:     Effort: Pulmonary effort is normal. No respiratory distress.     Breath sounds: Normal breath sounds. No stridor. No wheezing, rhonchi or rales.  Musculoskeletal:     Cervical back: Neck supple. No rigidity.     Right lower leg: No edema.     Left lower leg: No edema.  Skin:    General: Skin is warm and dry.  Neurological:     General: No focal deficit present.     Mental Status: She is alert and oriented to person, place, and time.  Psychiatric:        Mood and Affect: Mood normal.        Behavior: Behavior normal.      No results found for any visits on 02/25/23.  Last metabolic panel Lab Results  Component Value Date   GLUCOSE 98 12/28/2022   NA 143 12/28/2022   K 4.1 12/28/2022   CL 101 12/28/2022   CO2 28 12/28/2022   BUN 14 12/28/2022   CREATININE 0.79 12/28/2022   EGFR 84 12/28/2022   CALCIUM 8.8  12/28/2022   PROT 6.0 08/24/2022   ALBUMIN 4.2 08/24/2022   LABGLOB 1.8 08/24/2022   AGRATIO 2.3 08/24/2022   BILITOT <0.2 08/24/2022   ALKPHOS 111 08/24/2022   AST 21 08/24/2022   ALT 25 08/24/2022   ANIONGAP 9 06/12/2015      The 10-year ASCVD risk score (Arnett DK, et al., 2019) is: 6.3%    Assessment & Plan:   Canei was seen today for medical management of chronic issues and hypertension.  Diagnoses and all orders for this visit:  Primary hypertension Well controlled on current regimen. She would like to trial off of amlodipine. Discussed ok to hold. Monitor BP at home for at least 2 weeks. Discussed goal of <130/90 and to restart if BP is elevated.   Mixed hyperlipidemia Diet, exercise.   COPD GOLD II/ MZ/ quit smoking 1981  Stable with trelegy. Managed by pulmonology.   Chronic gastritis, presence of bleeding unspecified, unspecified gastritis type Managed by GI.   Depression, major, single episode, moderate (HCC) Anxiety Well controlled on current regimen. Now established with physiatry.   Chronic right-sided low back pain with right-sided sciatica Cervical radiculopathy Continue gabapentin, mobic.  -     meloxicam (MOBIC) 15 MG tablet; Take 1 tablet (15 mg total) by mouth daily.  Encounter for immunization -     Flu vaccine trivalent PF, 6mos and older(Flulaval,Afluria,Fluarix,Fluzone)  Plantar fasciitis of right foot Discussed stretching, mobic, supportive shoes. Discussed referral to podiatry if symptoms worsen or do not improve.  -     meloxicam (MOBIC) 15 MG tablet; Take 1 tablet (15 mg total) by mouth daily.    Return in about 6 months (around 08/26/2023) for CPE.   The patient indicates understanding of these issues and agrees with the plan.  Gabriel Earing, FNP

## 2023-02-25 NOTE — Patient Instructions (Signed)
Plantar Fasciitis  Plantar fasciitis is a painful foot condition that affects the heel. It occurs when the band of tissue that connects the toes to the heel bone (plantar fascia) becomes irritated. This can happen as the result of exercising too much or doing other repetitive activities (overuse injury). Plantar fasciitis can cause mild irritation to severe pain that makes it difficult to walk or move. The pain is usually worse in the morning after sleeping, or after sitting or lying down for a period of time. Pain may also be worse after long periods of walking or standing. What are the causes? This condition may be caused by: Standing for long periods of time. Wearing shoes that do not have good arch support. Doing activities that put stress on joints (high-impact activities). This includes ballet and exercise that makes your heart beat faster (aerobic exercise), such as running. Being overweight. An abnormal way of walking (gait). Tight muscles in the back of your lower leg (calf). High arches in your feet or flat feet. Starting a new athletic activity. What are the signs or symptoms? The main symptom of this condition is heel pain. Pain may get worse after the following: Taking the first steps after a time of rest, especially in the morning after awakening, or after you have been sitting or lying down for a while. Long periods of standing still. Pain may decrease after 30-45 minutes of activity, such as gentle walking. How is this diagnosed? This condition may be diagnosed based on your medical history, a physical exam, and your symptoms. Your health care provider will check for: A tender area on the bottom of your foot. A high arch in your foot or flat feet. Pain when you move your foot. Difficulty moving your foot. You may have imaging tests to confirm the diagnosis, such as: X-rays. Ultrasound. MRI. How is this treated? Treatment for plantar fasciitis depends on how severe your  condition is. Treatment may include: Rest, ice, pressure (compression), and raising (elevating) the affected foot. This is called RICE therapy. Your health care provider may recommend RICE therapy along with over-the-counter pain medicines to manage your pain. Exercises to stretch your calves and your plantar fascia. A splint that holds your foot in a stretched, upward position while you sleep (night splint). Physical therapy to relieve symptoms and prevent problems in the future. Injections of steroid medicine (cortisone) to relieve pain and inflammation. Stimulating your plantar fascia with electrical impulses (extracorporeal shock wave therapy). This is usually the last treatment option before surgery. Surgery, if other treatments have not worked after 12 months. Follow these instructions at home: Managing pain, stiffness, and swelling  If directed, put ice on the painful area. To do this: Put ice in a plastic bag, or use a frozen bottle of water. Place a towel between your skin and the bag or bottle. Roll the bottom of your foot over the bag or bottle. Do this for 20 minutes, 2-3 times a day. Wear athletic shoes that have air-sole or gel-sole cushions, or try soft shoe inserts that are designed for plantar fasciitis. Elevate your foot above the level of your heart while you are sitting or lying down. Activity Avoid activities that cause pain. Ask your health care provider what activities are safe for you. Do physical therapy exercises and stretches as told by your health care provider. Try activities and forms of exercise that are easier on your joints (low impact). Examples include swimming, water aerobics, and biking. General instructions Take over-the-counter   and prescription medicines only as told by your health care provider. Wear a night splint while sleeping, if told by your health care provider. Loosen the splint if your toes tingle, become numb, or turn cold and blue. Maintain a  healthy weight, or work with your health care provider to lose weight as needed. Keep all follow-up visits. This is important. Contact a health care provider if you have: Symptoms that do not go away with home treatment. Pain that gets worse. Pain that affects your ability to move or do daily activities. Summary Plantar fasciitis is a painful foot condition that affects the heel. It occurs when the band of tissue that connects the toes to the heel bone (plantar fascia) becomes irritated. Heel pain is the main symptom of this condition. It may get worse after exercising too much or standing still for a long time. Treatment varies, but it usually starts with rest, ice, pressure (compression), and raising (elevating) the affected foot. This is called RICE therapy. Over-the-counter medicines can also be used to manage pain. This information is not intended to replace advice given to you by your health care provider. Make sure you discuss any questions you have with your health care provider. Document Revised: 06/19/2019 Document Reviewed: 06/19/2019 Elsevier Patient Education  2024 Elsevier Inc.  

## 2023-03-01 ENCOUNTER — Other Ambulatory Visit: Payer: Self-pay | Admitting: Family Medicine

## 2023-03-01 DIAGNOSIS — G8929 Other chronic pain: Secondary | ICD-10-CM

## 2023-03-01 DIAGNOSIS — M722 Plantar fascial fibromatosis: Secondary | ICD-10-CM

## 2023-03-17 DIAGNOSIS — Z419 Encounter for procedure for purposes other than remedying health state, unspecified: Secondary | ICD-10-CM | POA: Diagnosis not present

## 2023-03-18 DIAGNOSIS — Z5181 Encounter for therapeutic drug level monitoring: Secondary | ICD-10-CM | POA: Diagnosis not present

## 2023-03-18 DIAGNOSIS — Z79899 Other long term (current) drug therapy: Secondary | ICD-10-CM | POA: Diagnosis not present

## 2023-03-30 ENCOUNTER — Ambulatory Visit (INDEPENDENT_AMBULATORY_CARE_PROVIDER_SITE_OTHER): Payer: Medicaid Other | Admitting: Pulmonary Disease

## 2023-03-30 ENCOUNTER — Ambulatory Visit (HOSPITAL_BASED_OUTPATIENT_CLINIC_OR_DEPARTMENT_OTHER): Payer: Medicaid Other | Admitting: Pulmonary Disease

## 2023-03-30 VITALS — BP 130/64 | HR 60 | Temp 97.8°F | Ht 67.0 in | Wt 171.0 lb

## 2023-03-30 DIAGNOSIS — R0602 Shortness of breath: Secondary | ICD-10-CM

## 2023-03-30 DIAGNOSIS — J431 Panlobular emphysema: Secondary | ICD-10-CM

## 2023-03-30 DIAGNOSIS — J439 Emphysema, unspecified: Secondary | ICD-10-CM

## 2023-03-30 LAB — PULMONARY FUNCTION TEST
DL/VA % pred: 99 %
DL/VA: 4.08 ml/min/mmHg/L
DLCO cor % pred: 80 %
DLCO cor: 17.74 ml/min/mmHg
DLCO unc % pred: 80 %
DLCO unc: 17.74 ml/min/mmHg
FEF 25-75 Post: 0.85 L/s
FEF 25-75 Pre: 0.59 L/s
FEF2575-%Change-Post: 43 %
FEF2575-%Pred-Post: 36 %
FEF2575-%Pred-Pre: 25 %
FEV1-%Change-Post: 14 %
FEV1-%Pred-Post: 54 %
FEV1-%Pred-Pre: 47 %
FEV1-Post: 1.48 L
FEV1-Pre: 1.3 L
FEV1FVC-%Change-Post: 3 %
FEV1FVC-%Pred-Pre: 73 %
FEV6-%Change-Post: 8 %
FEV6-%Pred-Post: 71 %
FEV6-%Pred-Pre: 65 %
FEV6-Post: 2.46 L
FEV6-Pre: 2.26 L
FEV6FVC-%Change-Post: -1 %
FEV6FVC-%Pred-Post: 101 %
FEV6FVC-%Pred-Pre: 103 %
FVC-%Change-Post: 10 %
FVC-%Pred-Post: 70 %
FVC-%Pred-Pre: 63 %
FVC-Post: 2.52 L
FVC-Pre: 2.27 L
Post FEV1/FVC ratio: 59 %
Post FEV6/FVC ratio: 98 %
Pre FEV1/FVC ratio: 57 %
Pre FEV6/FVC Ratio: 100 %
RV % pred: 149 %
RV: 3.32 L
TLC % pred: 105 %
TLC: 5.81 L

## 2023-03-30 MED ORDER — TRELEGY ELLIPTA 200-62.5-25 MCG/ACT IN AEPB: 1.0000 | INHALATION_SPRAY | Freq: Every day | RESPIRATORY_TRACT | 3 refills | Status: DC

## 2023-03-30 NOTE — Patient Instructions (Signed)
 Full PFT Performed Today

## 2023-03-30 NOTE — Patient Instructions (Signed)
 Obtain alpha-1 antitrypsin level  CT scan of the chest without contrast for emphysema  Increase Trelegy to Trelegy 200 from Trelegy 100  Try and use your rescue inhaler before bedtime  Continue graded activities as tolerated  Follow-up in 3 months

## 2023-03-30 NOTE — Progress Notes (Signed)
 Carla Bell    [id number]    February 18, 1961  Primary Care Physician:Morgan, Annabella HERO, FNP  Referring Physician: Joesph Annabella HERO, FNP [address] [place],  [place] [address]  Chief complaint:   Patient with a history of alpha-1 deficiency Shortness of breath on exertion  HPI:  Continues to use Trelegy on a regular basis  Has been waking up at night sometimes just feeling like she cannot breathe  Has not been needing rescue inhaler on a regular basis Tries to stay active  Did have a pulmonary function test today compared with 1 performed 2 years ago shows progression of obstructive disease  History of alpha-1 antitrypsin deficiency with levels of 87 in 2017, 107 in 2022, Pi MZ phenotype  Remote history of social smoking  Outpatient Encounter Medications as of 03/30/2023  Medication Sig   albuterol  (PROVENTIL ) (2.5 MG/3ML) 0.083% nebulizer solution Take 3 mLs (2.5 mg total) by nebulization every 4 (four) hours as needed for wheezing or shortness of breath.   albuterol  (VENTOLIN  HFA) 108 (90 Base) MCG/ACT inhaler Inhale 2 puffs into the lungs every 6 (six) hours as needed for wheezing or shortness of breath.   amLODipine  (NORVASC ) 5 MG tablet Take 1 tablet (5 mg total) by mouth daily.   buprenorphine (SUBUTEX) 8 MG SUBL SL tablet Place 8 mg under the tongue 2 (two) times daily.   Butalbital-APAP-Caffeine 50-300-40 MG CAPS Take by mouth.   citalopram  (CELEXA ) 20 MG tablet Take 1 tablet (20 mg total) by mouth daily.   dicyclomine  (BENTYL ) 10 MG capsule Take 2 capsules (20 mg total) by mouth at bedtime. TAKE 1 CAPSULE BY MOUTH 4 TIMES DAILY BEFORE MEAL(S) AND AT BEDTIME   Fluticasone -Umeclidin-Vilant (TRELEGY ELLIPTA ) 100-62.5-25 MCG/ACT AEPB Inhale 1 puff into the lungs daily.   gabapentin  (NEURONTIN ) 300 MG capsule Take 2 capsules (600 mg total) by mouth at bedtime.   meloxicam  (MOBIC ) 15 MG tablet Take 1 tablet (15 mg total) by mouth daily.   pantoprazole   (PROTONIX ) 40 MG tablet Take 1 tablet (40 mg total) by mouth 2 (two) times daily.   No facility-administered encounter medications on file as of 03/30/2023.    Allergies as of 03/30/2023 - Review Complete 03/30/2023  Allergen Reaction Noted   Demerol  [meperidine ] Nausea Only 12/16/2016    Past Medical History:  Diagnosis Date   Alpha-1-antitrypsin deficiency (HCC)    Asthma    Bowel obstruction (HCC)    Celiac disease    Colon polyps    COPD (chronic obstructive pulmonary disease) (HCC)    Gallstones    IBS (irritable bowel syndrome)    Ovarian cancer (HCC) 1990   Pericardial effusion 07/09/2015   Pleuritic chest pain 07/09/2015   Rotator cuff tear    left   UC (ulcerative colitis) (HCC)     Past Surgical History:  Procedure Laterality Date   ABDOMINAL HYSTERECTOMY     1989   BUNIONECTOMY WITH HAMMERTOE RECONSTRUCTION Right    CHOLECYSTECTOMY     COLONOSCOPY  04/25/2020   Suzen Brass,    TONSILLECTOMY      Family History  Problem Relation Age of Onset   Alpha-1 antitrypsin deficiency Mother    Alpha-1 antitrypsin deficiency Daughter    Colon cancer Neg Hx    Rectal cancer Neg Hx    Stomach cancer Neg Hx    Prostate cancer Neg Hx     Social History   Socioeconomic History   Marital status:  Married    Spouse name: Not on file   Number of children: 3   Years of education: 14   Highest education level: Not on file  Occupational History   Occupation: research scientist (physical sciences)  Tobacco Use   Smoking status: Former    Current packs/day: 0.00    Average packs/day: 0.3 packs/day for 2.0 years (0.5 ttl pk-yrs)    Types: Cigarettes    Start date: 03/16/1977    Quit date: 03/17/1979    Years since quitting: 44.0   Smokeless tobacco: Never  Vaping Use   Vaping status: Former   Start date: 05/15/2022   Quit date: 07/15/2022  Substance and Sexual Activity   Alcohol use: No    Alcohol/week: 0.0 standard drinks of alcohol   Drug use: Yes    Types: Other-see comments     Comment: THCA smoke 1 daily   Sexual activity: Not on file  Other Topics Concern   Not on file  Social History Narrative   Lives with husband in a 2 story home.  Has 3 children.  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  Education: 2 years of college.   Social Drivers of Corporate Investment Banker Strain: Low Risk  (02/11/2021)   Received from [facility], [facility]   Overall Financial Resource Strain (CARDIA)    Difficulty of Paying Living Expenses: Not very hard  Food Insecurity: No Food Insecurity (02/11/2021)   Received from [facility], [facility]   Hunger Vital Sign    Worried About Running Out of Food in the Last Year: Never true    Ran Out of Food in the Last Year: Never true  Transportation Needs: No Transportation Needs (02/11/2021)   Received from [facility], [facility]   [facility] - Transportation    Lack of Transportation (Medical): No    Lack of Transportation (Non-Medical): No  Physical Activity: Sufficiently Active (02/11/2021)   Received from [facility], [facility]   Exercise Vital Sign    Days of Exercise per Week: 5 days    Minutes of Exercise per Session: 30 min  Stress: No Stress Concern Present (02/11/2021)   Received from [facility], [facility] of [facility] - Occupational Stress Questionnaire    Feeling of Stress : Only a little  Social Connections: Socially Integrated (02/11/2021)   Received from [facility], [facility]   Social Connection and Isolation Panel [NHANES]    Frequency of Communication with Friends and Family: More than three times a week    Frequency of Social Gatherings with Friends and Family: More than three times a week    Attends Religious Services: More than 4 times per year    Active Member of [employer] or Organizations: Yes    Attends Engineer, Structural: More than 4 times per year    Marital Status: Married  Catering Manager  Violence: Not At Risk (02/11/2021)   Received from [facility], [facility]   Humiliation, Afraid, Rape, and Kick questionnaire    Fear of Current or Ex-Partner: No    Emotionally Abused: No    Physically Abused: No    Sexually Abused: No    Review of Systems  Constitutional:  Negative for fatigue.  Respiratory:  Positive for cough and shortness of breath.   Psychiatric/Behavioral:  Negative for sleep disturbance.     Vitals:  03/30/23 1513  BP: 130/64  Pulse: 60  Temp: 97.8 F (36.6 C)  SpO2: 95%     Physical Exam Constitutional:      Appearance: Normal appearance.  HENT:     Head: Normocephalic.     Mouth/Throat:     Mouth: Mucous membranes are moist.  Eyes:     General:        Right eye: No discharge.        Left eye: No discharge.  Cardiovascular:     Rate and Rhythm: Normal rate and regular rhythm.     Pulses: Normal pulses.     Heart sounds: Normal heart sounds. No murmur heard.    No friction rub.  Pulmonary:     Effort: No respiratory distress.     Breath sounds: No stridor. Rhonchi present. No wheezing.  Musculoskeletal:     Cervical back: No rigidity or tenderness.  Neurological:     Mental Status: She is alert.  Psychiatric:        Mood and Affect: Mood normal.    Data Reviewed: Previous PFT from 2017 reviewed showing mild obstructive lung disease with significant bronchodilator response Current PFT was reviewed with the patient showing no significant change compared to previous showing mild obstructive disease with no significant bronchodilator response  Previous chest x-ray with hyperinflated lung fields-reviewed by myself  PFT reviewed with the patient today showing progressive obstructive disease with FEV1 currently of 47% RV of 149%  Assessment:  Obstructive lung disease/emphysema -Will continue Trelegy -Increase dose of Trelegy from Trelegy 100 to Trelegy 200  Nighttime awakening -Encouraged to use a rescue inhaler before  bedtime -Hope increased dose of Trelegy will help this as well  With worsening obstructive disease, will obtain a CT scan of the chest -Obtain alpha-1 antitrypsin level  Encouraged to continue graded activities as tolerated  Plan/Recommendations:  Prescription for Trelegy 200  Obtain alpha-1 antitrypsin level  Obtain CT scan of the chest  Follow-up in 3 months  I spent 30 minutes dedicated to the care of this patient on the date of this encounter to include previsit review of records, face-to-face time with the patient discussing conditions above, post visit ordering of testing,ordering medications,independentlyinterpreting results, clinical documentation with electronic health record and communicated necessary findings to members of the patient's care team  Jennet Epley MD [facility] 03/30/2023, 3:24 PM  CC: Joesph Annabella HERO, FNP

## 2023-03-30 NOTE — Progress Notes (Signed)
 Full PFT Performed Today

## 2023-03-31 LAB — ALPHA-1-ANTITRYPSIN: A-1 Antitrypsin, Ser: 111 mg/dL (ref 83–199)

## 2023-04-05 ENCOUNTER — Other Ambulatory Visit: Payer: Self-pay | Admitting: Family Medicine

## 2023-04-05 DIAGNOSIS — G8929 Other chronic pain: Secondary | ICD-10-CM

## 2023-04-05 DIAGNOSIS — M722 Plantar fascial fibromatosis: Secondary | ICD-10-CM

## 2023-04-08 ENCOUNTER — Encounter: Payer: Self-pay | Admitting: Pulmonary Disease

## 2023-04-12 ENCOUNTER — Encounter: Payer: Self-pay | Admitting: Pulmonary Disease

## 2023-04-12 DIAGNOSIS — Z5181 Encounter for therapeutic drug level monitoring: Secondary | ICD-10-CM | POA: Diagnosis not present

## 2023-04-12 DIAGNOSIS — Z79899 Other long term (current) drug therapy: Secondary | ICD-10-CM | POA: Diagnosis not present

## 2023-04-17 DIAGNOSIS — Z419 Encounter for procedure for purposes other than remedying health state, unspecified: Secondary | ICD-10-CM | POA: Diagnosis not present

## 2023-04-29 ENCOUNTER — Other Ambulatory Visit: Payer: Medicaid Other

## 2023-04-30 ENCOUNTER — Other Ambulatory Visit: Payer: Self-pay | Admitting: Pulmonary Disease

## 2023-04-30 DIAGNOSIS — J431 Panlobular emphysema: Secondary | ICD-10-CM

## 2023-05-10 DIAGNOSIS — Z79899 Other long term (current) drug therapy: Secondary | ICD-10-CM | POA: Diagnosis not present

## 2023-05-10 DIAGNOSIS — Z5181 Encounter for therapeutic drug level monitoring: Secondary | ICD-10-CM | POA: Diagnosis not present

## 2023-05-10 DIAGNOSIS — F339 Major depressive disorder, recurrent, unspecified: Secondary | ICD-10-CM | POA: Diagnosis not present

## 2023-05-10 DIAGNOSIS — F411 Generalized anxiety disorder: Secondary | ICD-10-CM | POA: Diagnosis not present

## 2023-05-14 ENCOUNTER — Encounter: Payer: Self-pay | Admitting: Pulmonary Disease

## 2023-05-15 DIAGNOSIS — Z419 Encounter for procedure for purposes other than remedying health state, unspecified: Secondary | ICD-10-CM | POA: Diagnosis not present

## 2023-05-18 ENCOUNTER — Other Ambulatory Visit: Payer: Self-pay | Admitting: Family Medicine

## 2023-05-18 ENCOUNTER — Encounter: Payer: Self-pay | Admitting: Pulmonary Disease

## 2023-05-18 DIAGNOSIS — G8929 Other chronic pain: Secondary | ICD-10-CM

## 2023-05-18 DIAGNOSIS — M722 Plantar fascial fibromatosis: Secondary | ICD-10-CM

## 2023-05-19 ENCOUNTER — Encounter: Payer: Self-pay | Admitting: Pulmonary Disease

## 2023-05-26 ENCOUNTER — Other Ambulatory Visit: Payer: Medicaid Other

## 2023-05-26 ENCOUNTER — Telehealth: Payer: Self-pay | Admitting: Pulmonary Disease

## 2023-05-26 NOTE — Telephone Encounter (Signed)
 PT states she got a letter saying her CT scan today was denied. I adv her it has been approved and to call her Ins Co. She said she went to work when she got the letter and now needs to resched. Please call her to resched. Ty.

## 2023-06-07 DIAGNOSIS — Z5181 Encounter for therapeutic drug level monitoring: Secondary | ICD-10-CM | POA: Diagnosis not present

## 2023-06-09 NOTE — Telephone Encounter (Signed)
 NFN

## 2023-06-18 ENCOUNTER — Encounter: Payer: Self-pay | Admitting: Podiatry

## 2023-06-21 ENCOUNTER — Other Ambulatory Visit

## 2023-06-26 DIAGNOSIS — Z419 Encounter for procedure for purposes other than remedying health state, unspecified: Secondary | ICD-10-CM | POA: Diagnosis not present

## 2023-06-30 ENCOUNTER — Encounter: Payer: Self-pay | Admitting: Pulmonary Disease

## 2023-07-01 ENCOUNTER — Telehealth: Payer: Self-pay | Admitting: Pulmonary Disease

## 2023-07-01 ENCOUNTER — Encounter: Payer: Self-pay | Admitting: Pulmonary Disease

## 2023-07-01 NOTE — Telephone Encounter (Signed)
 Copied from CRM [id number]. Topic: Referral - Prior Authorization Question >> Jul 01, 2023  8:29 AM Margarette Shawl wrote: Reason for CRM:   Calling: [facility] Callback #: [phone]  Tanya Fantasia is calling in regards to CT scan scheduled today that was ordered by provider. Insurance has denied authorization for procedure and she would like to know if the clinic plans to do a peer-to-peer review for approval.  Requests call back, needs response/approval by 10 AM today or they will need to reschedule patient's procedure.   -  The documented approval in this pts CT referral states its approved til 3/24   Pts appt is 4/18, Tomorrow   Can you advise on this?

## 2023-07-02 ENCOUNTER — Other Ambulatory Visit

## 2023-07-05 DIAGNOSIS — Z5181 Encounter for therapeutic drug level monitoring: Secondary | ICD-10-CM | POA: Diagnosis not present

## 2023-07-05 DIAGNOSIS — Z79899 Other long term (current) drug therapy: Secondary | ICD-10-CM | POA: Diagnosis not present

## 2023-07-19 ENCOUNTER — Telehealth: Payer: Self-pay

## 2023-07-19 NOTE — Telephone Encounter (Signed)
 Copied from CRM [id number]. Topic: Clinical - Request for Lab/Test Order >> Jul 19, 2023  2:18 PM Chantha C wrote: Reason for CRM: Adah Acron from [facility] imaging [phone] ext [phone] is checking CT chest without contrast, can see that the authorization was withdrawn and if the office authorizatized it please let Adah Acron know so she can schedule patien for an appointment. Please advise and call back.  Will send this message to the [facility] to look into this .

## 2023-07-23 ENCOUNTER — Encounter: Payer: Self-pay | Admitting: Pulmonary Disease

## 2023-07-24 ENCOUNTER — Other Ambulatory Visit: Payer: Self-pay | Admitting: Pulmonary Disease

## 2023-07-26 DIAGNOSIS — Z419 Encounter for procedure for purposes other than remedying health state, unspecified: Secondary | ICD-10-CM | POA: Diagnosis not present

## 2023-07-27 ENCOUNTER — Ambulatory Visit
Admission: RE | Admit: 2023-07-27 | Discharge: 2023-07-27 | Disposition: A | Discharge status: Home / Self Care | Source: Ambulatory Visit | Attending: Pulmonary Disease | Admitting: Pulmonary Disease

## 2023-07-27 DIAGNOSIS — I2510 Atherosclerotic heart disease of native coronary artery without angina pectoris: Secondary | ICD-10-CM | POA: Diagnosis not present

## 2023-07-27 DIAGNOSIS — I700 Atherosclerosis of aorta: Secondary | ICD-10-CM | POA: Diagnosis not present

## 2023-07-27 DIAGNOSIS — J431 Panlobular emphysema: Secondary | ICD-10-CM

## 2023-08-02 DIAGNOSIS — Z5181 Encounter for therapeutic drug level monitoring: Secondary | ICD-10-CM | POA: Diagnosis not present

## 2023-08-02 DIAGNOSIS — Z79899 Other long term (current) drug therapy: Secondary | ICD-10-CM | POA: Diagnosis not present

## 2023-08-24 ENCOUNTER — Other Ambulatory Visit: Payer: Self-pay | Admitting: Pulmonary Disease

## 2023-08-26 DIAGNOSIS — Z419 Encounter for procedure for purposes other than remedying health state, unspecified: Secondary | ICD-10-CM | POA: Diagnosis not present

## 2023-08-27 ENCOUNTER — Encounter: Payer: Medicaid Other | Admitting: Family Medicine

## 2023-08-30 ENCOUNTER — Encounter: Payer: Self-pay | Admitting: Family Medicine

## 2023-08-30 DIAGNOSIS — Z79899 Other long term (current) drug therapy: Secondary | ICD-10-CM | POA: Diagnosis not present

## 2023-08-30 DIAGNOSIS — Z5181 Encounter for therapeutic drug level monitoring: Secondary | ICD-10-CM | POA: Diagnosis not present

## 2023-09-03 ENCOUNTER — Encounter: Payer: Self-pay | Admitting: Internal Medicine

## 2023-09-21 DIAGNOSIS — Z79899 Other long term (current) drug therapy: Secondary | ICD-10-CM | POA: Diagnosis not present

## 2023-09-21 DIAGNOSIS — Z5181 Encounter for therapeutic drug level monitoring: Secondary | ICD-10-CM | POA: Diagnosis not present

## 2023-09-25 DIAGNOSIS — Z419 Encounter for procedure for purposes other than remedying health state, unspecified: Secondary | ICD-10-CM | POA: Diagnosis not present

## 2023-09-27 ENCOUNTER — Other Ambulatory Visit: Payer: Self-pay | Admitting: Pulmonary Disease

## 2023-10-20 DIAGNOSIS — Z79899 Other long term (current) drug therapy: Secondary | ICD-10-CM | POA: Diagnosis not present

## 2023-10-20 DIAGNOSIS — Z5181 Encounter for therapeutic drug level monitoring: Secondary | ICD-10-CM | POA: Diagnosis not present

## 2023-10-20 DIAGNOSIS — F331 Major depressive disorder, recurrent, moderate: Secondary | ICD-10-CM | POA: Diagnosis not present

## 2023-10-25 ENCOUNTER — Other Ambulatory Visit: Payer: Self-pay | Admitting: Pulmonary Disease

## 2023-10-26 DIAGNOSIS — Z419 Encounter for procedure for purposes other than remedying health state, unspecified: Secondary | ICD-10-CM | POA: Diagnosis not present

## 2023-11-02 ENCOUNTER — Ambulatory Visit: Admitting: Gastroenterology

## 2023-11-02 ENCOUNTER — Telehealth: Payer: Self-pay | Admitting: Gastroenterology

## 2023-11-02 DIAGNOSIS — K2950 Unspecified chronic gastritis without bleeding: Secondary | ICD-10-CM

## 2023-11-02 DIAGNOSIS — K589 Irritable bowel syndrome without diarrhea: Secondary | ICD-10-CM

## 2023-11-02 MED ORDER — PANTOPRAZOLE SODIUM 40 MG PO TBEC: 40.0000 mg | DELAYED_RELEASE_TABLET | Freq: Two times a day (BID) | ORAL | 3 refills | Status: AC

## 2023-11-02 MED ORDER — DICYCLOMINE HCL 10 MG PO CAPS: 20.0000 mg | ORAL_CAPSULE | Freq: Every day | ORAL | 3 refills | Status: AC

## 2023-11-02 NOTE — Telephone Encounter (Signed)
 Refills provided for patient.  Ladonna: please arrange follow-up for 6 months. Thanks!

## 2023-11-02 NOTE — Addendum Note (Signed)
 Addended by: SHIRLEAN THERISA ORN on: 11/02/2023 01:18 PM   Modules accepted: Orders

## 2023-11-02 NOTE — Telephone Encounter (Signed)
 Refills

## 2023-11-17 DIAGNOSIS — F331 Major depressive disorder, recurrent, moderate: Secondary | ICD-10-CM | POA: Diagnosis not present

## 2023-11-17 DIAGNOSIS — Z5181 Encounter for therapeutic drug level monitoring: Secondary | ICD-10-CM | POA: Diagnosis not present

## 2023-12-15 DIAGNOSIS — F339 Major depressive disorder, recurrent, unspecified: Secondary | ICD-10-CM | POA: Diagnosis not present

## 2023-12-15 DIAGNOSIS — Z5181 Encounter for therapeutic drug level monitoring: Secondary | ICD-10-CM | POA: Diagnosis not present

## 2023-12-15 DIAGNOSIS — F411 Generalized anxiety disorder: Secondary | ICD-10-CM | POA: Diagnosis not present

## 2024-01-01 ENCOUNTER — Other Ambulatory Visit: Payer: Self-pay | Admitting: Pulmonary Disease

## 2024-01-04 NOTE — Telephone Encounter (Signed)
 Patient needs appointment for farther refills

## 2024-01-11 ENCOUNTER — Other Ambulatory Visit (HOSPITAL_COMMUNITY): Payer: Self-pay

## 2024-01-11 ENCOUNTER — Telehealth: Payer: Self-pay

## 2024-01-11 NOTE — Telephone Encounter (Signed)
*  Pulm  Pharmacy Patient Advocate Encounter   Received notification from Fax that prior authorization for Trelegy Ellipta  200-62.5-25MCG/ACT aerosol powder  is required/requested.   Insurance verification completed.   The patient is insured through [employer].   Per test claim: The current 30 day co-pay is, $4.80.  No PA needed at this time. This test claim was processed through [facility]- copay amounts may vary at other pharmacies due to pharmacy/plan contracts, or as the patient moves through the different stages of their insurance plan.

## 2024-02-03 ENCOUNTER — Encounter: Payer: Self-pay | Admitting: Family Medicine

## 2024-02-03 ENCOUNTER — Ambulatory Visit

## 2024-02-03 ENCOUNTER — Other Ambulatory Visit: Payer: Self-pay | Admitting: Pulmonary Disease

## 2024-02-03 ENCOUNTER — Ambulatory Visit: Admitting: Family Medicine

## 2024-02-03 VITALS — BP 130/72 | HR 78 | Temp 97.4°F | Ht 67.0 in | Wt 160.2 lb

## 2024-02-03 DIAGNOSIS — R1032 Left lower quadrant pain: Secondary | ICD-10-CM | POA: Diagnosis not present

## 2024-02-03 DIAGNOSIS — I10 Essential (primary) hypertension: Secondary | ICD-10-CM

## 2024-02-03 DIAGNOSIS — G8929 Other chronic pain: Secondary | ICD-10-CM

## 2024-02-03 DIAGNOSIS — J449 Chronic obstructive pulmonary disease, unspecified: Secondary | ICD-10-CM

## 2024-02-03 DIAGNOSIS — J441 Chronic obstructive pulmonary disease with (acute) exacerbation: Secondary | ICD-10-CM

## 2024-02-03 DIAGNOSIS — S2232XD Fracture of one rib, left side, subsequent encounter for fracture with routine healing: Secondary | ICD-10-CM | POA: Diagnosis not present

## 2024-02-03 DIAGNOSIS — M79672 Pain in left foot: Secondary | ICD-10-CM

## 2024-02-03 DIAGNOSIS — Z23 Encounter for immunization: Secondary | ICD-10-CM

## 2024-02-03 DIAGNOSIS — F419 Anxiety disorder, unspecified: Secondary | ICD-10-CM

## 2024-02-03 DIAGNOSIS — L84 Corns and callosities: Secondary | ICD-10-CM

## 2024-02-03 DIAGNOSIS — F1120 Opioid dependence, uncomplicated: Secondary | ICD-10-CM

## 2024-02-03 DIAGNOSIS — F321 Major depressive disorder, single episode, moderate: Secondary | ICD-10-CM

## 2024-02-03 MED ORDER — AMLODIPINE BESYLATE 5 MG PO TABS: 5.0000 mg | ORAL_TABLET | Freq: Every day | ORAL | 3 refills | Status: AC

## 2024-02-03 MED ORDER — DICLOFENAC SODIUM 75 MG PO TBEC: 75.0000 mg | DELAYED_RELEASE_TABLET | Freq: Two times a day (BID) | ORAL | 0 refills | Status: AC

## 2024-02-03 NOTE — Progress Notes (Addendum)
 Established Patient Office Visit  Subjective   Patient ID: Carla Bell, female    DOB: [birth year]  Age: 63 y.o. MRN: [id number]  Chief Complaint  Patient presents with   Medical Management of Chronic Issues    HPI  History of Present Illness   Carla Bell is a 63 year old female who presents with rib pain following a fall.  Left rib pain following trauma - Fall occurred on November 4th, resulting in fracture of the left ninth rib - Pain located on the anterior aspect of the left rib, radiating to the back - Pain onset the morning after the fall - Pain has been improving but can be severe at time, especially with coughing or turning - She was seen in the ER and had imaging done for her back and ribs  Groin and thigh pain - Severe pain in the groin radiating down the anterior thigh and occasionally to the knee - Pain is intermittent, with fluctuating intensity (good and bad days) - Duration of pain is over one year - Pain significantly impairs ambulation, especially during work - Prior workup included x-ray of the thigh and PT - Physical therapy completed for left thigh pain - Treatments attempted include meloxicam , gabapentin , and topical agents such as Salonpas, with persistent pain  Foot swelling and pain - Swelling and pain localized to the knuckles of the foot, present for approximately four months - Knuckle is swollen and tender to palpation - Presence of thick calluses that are difficult to remove  Pulmonary symptoms and history - History of pneumonia in one lung prior to the fall - Established with pulmonology for COPD - Awaiting lung function testing scheduled for February  Functional status and occupational impact - Currently employed at two jobs, 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 - Hip and groin pain make work duties challenging  Medication use and pain management - Current medications include antihypertensive  agent and citalopram  (managed by psychiatrist) - History of opioid use for pain, now transitioned to buprenorphine (managed by psychiatrist)          02/25/2023    1:58 PM 12/28/2022    2:40 PM 08/24/2022    2:02 PM  Depression screen PHQ 2/9  Decreased Interest 0 0 0  Down, Depressed, Hopeless 0 0 0  PHQ - 2 Score 0 0 0  Altered sleeping 0 0 0  Tired, decreased energy 0 0 2  Change in appetite 2 0 0  Feeling bad or failure about yourself  0 0 0  Trouble concentrating 2 0 1  Moving slowly or fidgety/restless 0 0 0  Suicidal thoughts 0 0 0  PHQ-9 Score 4  0  3   Difficult doing work/chores Not difficult at all Not difficult at all Not difficult at all     Data saved with a previous flowsheet row definition      02/25/2023    1:57 PM 12/28/2022    2:40 PM 08/24/2022    2:02 PM 05/14/2022    2:09 PM  GAD 7 : Generalized Anxiety Score  Nervous, Anxious, on Edge 0 0 0 0  Control/stop worrying 0 0 0 0  Worry too much - different things 0 0 0 0  Trouble relaxing 0 0 0 0  Restless 2 0 0 0  Easily annoyed or irritable 0 0 0 0  Afraid - awful might happen 0 0 0 0  Total GAD 7 Score 2 0 0 0  Anxiety Difficulty Very difficult Not difficult at all Not difficult at all Not difficult at all        ROS As per HPI.    Objective:     BP 130/72   Pulse 78   Temp (!) 97.4 F (36.3 C) (Temporal)   Ht 5' 7 (1.702 m)   Wt 160 lb 3.2 oz (72.7 kg)   SpO2 (!) 89%   BMI 25.09 kg/m    Physical Exam Vitals and nursing note reviewed.  Constitutional:      General: She is not in acute distress.    Appearance: She is not ill-appearing, toxic-appearing or diaphoretic.  Cardiovascular:     Rate and Rhythm: Normal rate and regular rhythm.     Heart sounds: Normal heart sounds. No murmur heard. Pulmonary:     Effort: Pulmonary effort is normal. No respiratory distress.     Breath sounds: Normal breath sounds. No stridor. No wheezing, rhonchi or rales.  Chest:     Chest wall:  Tenderness (left lower anterolateral) present.  Musculoskeletal:     Comments: Mild swelling and tenderness to distal left foot to callus to medial great toe.   Skin:    General: Skin is warm and dry.  Neurological:     General: No focal deficit present.     Mental Status: She is alert and oriented to person, place, and time.  Psychiatric:        Mood and Affect: Mood normal.        Behavior: Behavior normal.      No results found for any visits on 02/03/24.    The 10-year ASCVD risk score (Arnett DK, et al., 2019) is: 8%    Assessment & Plan:   Burgandy was seen today for medical management of chronic issues.  Diagnoses and all orders for this visit:  Primary hypertension -     CBC with Differential/Platelet -     CMP14+EGFR -     TSH -     amLODipine  (NORVASC ) 5 MG tablet; Take 1 tablet (5 mg total) by mouth daily.  COPD GOLD II/ MZ/ quit smoking 1981   Closed fracture of one rib of left side with routine healing, subsequent encounter  Chronic pain of left groin -     DG Hip Unilat W OR W/O Pelvis 2-3 Views Left -     diclofenac  (VOLTAREN ) 75 MG EC tablet; Take 1 tablet (75 mg total) by mouth 2 (two) times daily.  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 - Continue amlodipine  - Labs pending  Left ninth rib fracture Sustained on November 4th, 2025, with significant pain exacerbated by movement and deep breathing. Healing expected in 6-8 weeks.  - Encouraged deep breathing exercises to prevent pneumonia. - Prescribed Voltaren  pill to reduce inflammation and pain. - Bracing, heat - Lungs clear on exam  Chronic left hip and groin pain Chronic pain  in left hip and groin, radiating down the front of the thigh, exacerbated by movement and affecting mobility. Previous x-ray of femur, no hip x-ray done. Differential includes hip pathology versus lumbar pathology - Ordered x-ray of left  hip and pelvis. - Referred to orthopedics in [facility] for further evaluation pending xray report  Pain in left foot with calluses Chronic pain in left foot with calluses, exacerbated by walking and standing. Previous foot surgery and sciatic issues. Calluses present on foot. - Referred to podiatry in [facility] for evaluation and management of foot pain and calluses.  COPD - No exacerbation symptoms - Continue follow up with orthopedics     Depression Anxiety Opioid dependence on agonist therapy Continue follow up with [facility].    Return in about 3 months (around 05/05/2024) for chronic follow up.   The patient indicates understanding of these issues and agrees with the plan.  Annabella CHRISTELLA Search, FNP

## 2024-02-04 ENCOUNTER — Ambulatory Visit: Payer: Self-pay | Admitting: Family Medicine

## 2024-02-04 DIAGNOSIS — R1032 Left lower quadrant pain: Secondary | ICD-10-CM

## 2024-02-04 DIAGNOSIS — M160 Bilateral primary osteoarthritis of hip: Secondary | ICD-10-CM

## 2024-02-04 LAB — CMP14+EGFR
ALT: 16 IU/L (ref 0–32)
AST: 20 IU/L (ref 0–40)
Albumin: 4.3 g/dL (ref 3.9–4.9)
Alkaline Phosphatase: 121 IU/L (ref 49–135)
BUN/Creatinine Ratio: 15 (ref 12–28)
BUN: 11 mg/dL (ref 8–27)
Bilirubin Total: <0.2 mg/dL (ref 0.0–1.2)
CO2: 30 mmol/L — ABNORMAL HIGH (ref 20–29)
Calcium: 9.1 mg/dL (ref 8.7–10.3)
Chloride: 99 mmol/L (ref 96–106)
Creatinine, Ser: 0.72 mg/dL (ref 0.57–1.00)
Globulin, Total: 2.1 g/dL (ref 1.5–4.5)
Glucose: 95 mg/dL (ref 70–99)
Potassium: 4.8 mmol/L (ref 3.5–5.2)
Sodium: 142 mmol/L (ref 134–144)
Total Protein: 6.4 g/dL (ref 6.0–8.5)
eGFR: 93 mL/min/1.73 (ref >59)

## 2024-02-04 LAB — CBC WITH DIFFERENTIAL/PLATELET
Basophils Absolute: 0 x10E3/uL (ref 0.0–0.2)
Basos: 0 %
EOS (ABSOLUTE): 0.3 x10E3/uL (ref 0.0–0.4)
Eos: 3 %
Hematocrit: 38.2 % (ref 34.0–46.6)
Hemoglobin: 12.6 g/dL (ref 11.1–15.9)
Immature Grans (Abs): 0 x10E3/uL (ref 0.0–0.1)
Immature Granulocytes: 0 %
Lymphocytes Absolute: 1.4 x10E3/uL (ref 0.7–3.1)
Lymphs: 18 %
MCH: 31.2 pg (ref 26.6–33.0)
MCHC: 33 g/dL (ref 31.5–35.7)
MCV: 95 fL (ref 79–97)
Monocytes Absolute: 0.4 x10E3/uL (ref 0.1–0.9)
Monocytes: 5 %
Neutrophils Absolute: 5.9 x10E3/uL (ref 1.4–7.0)
Neutrophils: 73 %
Platelets: 290 x10E3/uL (ref 150–450)
RBC: 4.04 x10E6/uL (ref 3.77–5.28)
RDW: 12 % (ref 11.7–15.4)
WBC: 8 x10E3/uL (ref 3.4–10.8)

## 2024-02-04 LAB — TSH: TSH: 1.09 u[IU]/mL (ref 0.450–4.500)

## 2024-02-23 ENCOUNTER — Encounter: Payer: Self-pay | Admitting: Surgical

## 2024-02-23 ENCOUNTER — Ambulatory Visit: Admitting: Surgical

## 2024-02-23 DIAGNOSIS — M25552 Pain in left hip: Secondary | ICD-10-CM | POA: Diagnosis not present

## 2024-02-23 DIAGNOSIS — M1611 Unilateral primary osteoarthritis, right hip: Secondary | ICD-10-CM

## 2024-02-23 MED ORDER — TRIAMCINOLONE ACETONIDE 40 MG/ML IJ SUSP
40.0000 mg | INTRAMUSCULAR | Status: AC | PRN
  Administered 2024-02-23: 40 mg via INTRA_ARTICULAR

## 2024-02-23 MED ORDER — BUPIVACAINE HCL 0.25 % IJ SOLN
7.0000 mL | INTRAMUSCULAR | Status: AC | PRN
  Administered 2024-02-23: 7 mL via INTRA_ARTICULAR

## 2024-02-23 NOTE — Progress Notes (Signed)
 Office Visit Note   Patient: Carla Bell           Date of Birth: 02/13/1961           MRN: [id number] Visit Date: 02/23/2024 Requested by: Joesph Annabella HERO, FNP [address] [place],  [place] [address] PCP: Joesph Annabella HERO, FNP  Subjective: Chief Complaint  Patient presents with   left hip pain    HPI: Carla Bell is a 63 y.o. female who presents to the office reporting right hip pain.  Patient describes right hip pain that has been ongoing for at least 1 year.  She describes groin pain that radiates down the anterior thigh and extends down to the level of the knee.  She also has lateral hip pain and posterior pain at times.  Occasional back pain prior to her fall on 01/18/2024 but she attributes this more to fractured ribs since that fall rather than any other problem.  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  She has no history of prior hip surgery.  She does have instability with walking in terms of the leg giving out on her.  She has increased pain at night.  Pain is worse with immobility and with any hip flexion such as getting in a car, going up stairs.  Also pain is worse when she tries to sleep and especially if she tries to fully extend her leg while sleeping.  She cannot lay on her right side at night.  She has tried physical therapy in the past which was not very much help.  She also has tried trochanteric injection with Dr. Barbarann in 2024 which provided her no relief.  She denies any significant medical problems..                ROS: All systems reviewed are negative as they relate to the chief complaint within the history of present illness.  Patient denies fevers or chills.  Assessment & Plan: Visit Diagnoses:  1. Arthritis of right hip   2. Pain in left hip     Plan: Impression is 63 year old female who has right hip pain.  She has radiographs reviewed that were ordered by her PCP that demonstrate  asymmetric right hip arthritis with loss of joint space.  This is consistent with her symptoms and lack of relief from trochanteric injection that she has had in the past.  We discussed options availed patient such as trying intra-articular cortisone injection versus physical therapy versus consultation for hip replacement.  She would like to try intra-articular hip injection and this was administered under ultrasound guidance today.  She will pay attention to how this affects her hip pain and we will see how long it lasts.  If it is helpful we can repeat the injection in 3 to 4 months at the earliest.  If it gives her good relief but only last for several weeks, would recommend consultation for hip replacement.  Follow-Up Instructions: No follow-ups on file.   Orders:  Orders Placed This Encounter  Procedures   US  GUIDED NEEDLE PLACEMENT(NO LINKED CHARGES)   No orders of the defined types were placed in this encounter.     Procedures: Large Joint Inj: R hip joint on 02/23/2024 2:16 PM Indications: pain and diagnostic evaluation Details: 22 G 3.5 in needle, anterior approach Medications: 7 mL bupivacaine  0.25 %; 40 mg triamcinolone  acetonide 40 MG/ML Outcome: tolerated well, no immediate complications  Injection under ultrasound guidance but imaging not working at current location so cannot capture images.  Definitive intra-articular injection was noted during procedural imaging. Procedure, treatment alternatives, risks and benefits explained, specific risks discussed. Consent was given by the patient. Immediately prior to procedure a time out was called to verify the correct patient, procedure, equipment, support staff and site/side marked as required. Patient was prepped and draped in the usual sterile fashion.       Clinical Data: No additional findings.  Objective: Vital Signs: There were no vitals taken for this visit.  Physical Exam:  Constitutional: Patient appears  well-developed HEENT:  Head: Normocephalic Eyes:EOM are normal Neck: Normal range of motion Cardiovascular: Normal rate Pulmonary/chest: Effort normal Neurologic: Patient is alert Skin: Skin is warm Psychiatric: Patient has normal mood and affect  Ortho Exam: Ortho exam demonstrates right hip with pain reproduced with FADIR sign.  Positive Stinchfield sign.  Pain reproduced with logroll of the right hip.  No cellulitis or skin changes noted overlying the right hip.  She does have moderate trochanteric tenderness.  She is able to abduct her leg while lying in the lateral position on her left side.  No tenderness over ASIS but does have mild to moderate tenderness over AIIS.  Pain reproduced with hip flexion actively and with testing or quad strength actively.  Hamstring strength intact and plantarflexion strength intact.  She does have chronic weakness of her dorsiflexion that she attributes to prior podiatry surgery from about 5 to 6 years ago.  Palpable DP pulse.  Specialty Comments:  No specialty comments available.  Imaging: No results found.   PMFS History: Patient Active Problem List   Diagnosis Date Noted   Opioid dependence on agonist therapy (HCC) 02/03/2024   Mixed hyperlipidemia 02/25/2023   Vitamin D  deficiency 12/28/2022   Primary hypertension 12/28/2022   Low back pain 07/09/2022   Trochanteric bursitis, right hip 06/11/2022   Depression, major, single episode, moderate (HCC) 05/14/2022   Anxiety 05/14/2022   Chronic gastritis 05/14/2022   Irritable bowel syndrome 05/14/2022   Hiatal hernia 05/14/2022   Cervical radiculopathy 05/14/2022   Pure hypercholesterolemia 02/21/2021   Chronic insomnia 02/21/2021   Alpha-1-antitrypsin deficiency (HCC) 02/21/2021   Impingement syndrome of left shoulder 10/09/2020   Sciatic neuropathy, right 12/10/2016   S/P cholecystectomy 07/15/2015   Dyspnea 07/12/2015   COPD GOLD II/ MZ/ quit smoking 1981  07/12/2015   Pericardial  effusion 07/09/2015   Pleuritic chest pain 07/09/2015   Alpha-1-antitrypsin deficiency carrier 06/14/2015   Vitiligo 06/14/2015   History of ovarian cancer, sp TAH and bilat salpingo oophorectomy, chemo, radiation 06/14/1987   Past Medical History:  Diagnosis Date   Alpha-1-antitrypsin deficiency (HCC)    Asthma    Bowel obstruction (HCC)    Celiac disease    Colon polyps    COPD (chronic obstructive pulmonary disease) (HCC)    Gallstones    IBS (irritable bowel syndrome)    Ovarian cancer (HCC) 1990   Pericardial effusion 07/09/2015   Pleuritic chest pain 07/09/2015   Rotator cuff tear    left   UC (ulcerative colitis) (HCC)     Family History  Problem Relation Age of Onset   Alpha-1 antitrypsin deficiency Mother    Alpha-1 antitrypsin deficiency Daughter    Colon cancer Neg Hx    Rectal cancer Neg Hx    Stomach cancer Neg Hx    Prostate cancer Neg Hx     Past Surgical History:  Procedure Laterality Date  ABDOMINAL HYSTERECTOMY     1989   BUNIONECTOMY WITH HAMMERTOE RECONSTRUCTION Right    CHOLECYSTECTOMY     COLONOSCOPY  04/25/2020   Suzen Brass,    TONSILLECTOMY     Social History   Occupational History   Occupation: research scientist (physical sciences)  Tobacco Use   Smoking status: Former    Current packs/day: 0.00    Average packs/day: 0.3 packs/day for 2.0 years (0.5 ttl pk-yrs)    Types: Cigarettes    Start date: 03/16/1977    Quit date: 03/17/1979    Years since quitting: 44.9   Smokeless tobacco: Never  Vaping Use   Vaping status: Former   Start date: 05/15/2022   Quit date: 07/15/2022  Substance and Sexual Activity   Alcohol use: No    Alcohol/week: 0.0 standard drinks of alcohol   Drug use: Yes    Types: Other-see comments    Comment: [employer] smoke 1 daily   Sexual activity: Not on file

## 2024-03-08 ENCOUNTER — Telehealth: Payer: Self-pay | Admitting: Surgical

## 2024-03-08 NOTE — Telephone Encounter (Signed)
 Pt called saying that her hip is getting worse and she needs to come in before your next apt. She is currently out of work due to this and needs a Dr's note stating that she can't work Call back number is [phone].

## 2024-03-13 ENCOUNTER — Telehealth: Payer: Self-pay | Admitting: Surgical

## 2024-03-13 ENCOUNTER — Telehealth: Payer: Self-pay | Admitting: Orthopedic Surgery

## 2024-03-13 DIAGNOSIS — M25551 Pain in right hip: Secondary | ICD-10-CM

## 2024-03-13 NOTE — Telephone Encounter (Signed)
 Carla Bell, can you see how much relief if any that she got from hip injection (several days or several hours )?

## 2024-03-13 NOTE — Telephone Encounter (Signed)
Duplicate message in chart.  

## 2024-03-13 NOTE — Telephone Encounter (Signed)
 Pt called saying that the injection that she got didn't work and that she's been out of work and needs a Dr's note for this. Call back number is [phone]

## 2024-03-13 NOTE — Telephone Encounter (Signed)
 Can we get MRI of affected hip to r/o occult stress fracture to be done at [facility]?

## 2024-03-13 NOTE — Telephone Encounter (Signed)
 MRI ordered, note entered, patient advised. Note emailed to [email or web address].

## 2024-03-14 NOTE — Telephone Encounter (Signed)
 Emailed as asked.

## 2024-03-15 ENCOUNTER — Ambulatory Visit (HOSPITAL_COMMUNITY)
Admission: RE | Admit: 2024-03-15 | Discharge: 2024-03-15 | Disposition: A | Discharge status: Home / Self Care | Source: Ambulatory Visit | Attending: Surgical | Admitting: Surgical

## 2024-03-15 DIAGNOSIS — M25551 Pain in right hip: Secondary | ICD-10-CM | POA: Insufficient documentation

## 2024-03-21 ENCOUNTER — Ambulatory Visit: Payer: Self-pay | Admitting: Surgical

## 2024-03-21 NOTE — Progress Notes (Signed)
 Hi Dr. Vernetta, this is a patient from [facility].  She is very nice and is eager to get back to her part-time job which involves a lot of walking.  Originally did not want hip replacement but, she failed intra-articular right hip injection and MRI demonstrates progressive arthritis worse than radiographs with possible stress reaction around the femoral neck.  Injection was somewhat recently done on 02/23/2024.  I was going to refer her to see you for hip replacement.  Reach out if you have any questions that I can answer for you  Thanks, Herlene

## 2024-03-21 NOTE — Progress Notes (Signed)
 Hi Betsy, I called [facility] and had extended discussion about her MRI results.  I had looked at the MRI results with Dr. Addie as well prior to calling her.  I recommended she get her vitamin D  level checked at [facility] in [place].  Can we fax order to them tomorrow morning?  I also discussed that she has arthritis that is worse on the MRI than it appears on x-ray and the pattern of marrow edema in the femoral neck is concerning for stress reaction.  I recommended that she offload her right leg is much as possible and use a cane or walker when she is ambulatory.  Eventually she will likely need hip replacement but unfortunately the recent cortisone injection precludes this right now.  Can we refer her to see Dr. Vernetta for right hip replacement?  I did discuss with patient that if she notices any severe worsening of her symptoms or any sudden severe increase in pain, she should reach out to us  as soon as possible or go to the ER.  We did discuss the risk of stress fracture and she understands

## 2024-03-22 ENCOUNTER — Other Ambulatory Visit: Payer: Self-pay | Admitting: Radiology

## 2024-03-22 DIAGNOSIS — M1611 Unilateral primary osteoarthritis, right hip: Secondary | ICD-10-CM

## 2024-03-22 DIAGNOSIS — M25551 Pain in right hip: Secondary | ICD-10-CM

## 2024-03-22 NOTE — Progress Notes (Signed)
 Referral entered to Dr. Vernetta. Vit D ordered and faxed to [facility].

## 2024-03-24 ENCOUNTER — Telehealth: Payer: Self-pay | Admitting: Surgical

## 2024-03-24 NOTE — Telephone Encounter (Signed)
 Patient called. Says she was supposed to have blood work done but no orders are in the system. Her cb# [phone]

## 2024-03-24 NOTE — Telephone Encounter (Signed)
 IC advised per note from [place] on 01/07 they were faxed to [facility].  I refaxed as well. She will check with [facility] and let us  know

## 2024-03-27 ENCOUNTER — Telehealth: Payer: Self-pay | Admitting: Orthopedic Surgery

## 2024-03-27 NOTE — Telephone Encounter (Signed)
 Carla Bell called.  Blood work orders have still not gone through to [facility].  Please fax to [phone] or [phone].

## 2024-03-27 NOTE — Telephone Encounter (Signed)
 Re-faxed to both numbers provided

## 2024-04-06 ENCOUNTER — Telehealth: Payer: Self-pay | Admitting: Family Medicine

## 2024-04-06 NOTE — Telephone Encounter (Signed)
 Patient needs [facility] appt with PCP before 12-13-24.

## 2024-04-12 ENCOUNTER — Encounter: Payer: Self-pay | Admitting: Orthopaedic Surgery

## 2024-04-12 ENCOUNTER — Ambulatory Visit: Admitting: Orthopaedic Surgery

## 2024-04-12 DIAGNOSIS — M1611 Unilateral primary osteoarthritis, right hip: Secondary | ICD-10-CM | POA: Insufficient documentation

## 2024-04-12 NOTE — Progress Notes (Signed)
 The patient is a very pleasant 64 year old that I am seeing for the first time sent to me by my partner Dr. Addie due to severe end-stage arthritis of her right hip with some evidence of osteonecrosis as well.  She also has a large paralabral cyst.  This is all seen on MRI but also her plain films show severe end-stage arthritis.  She walks with a significant limp and can barely get around now due to her right hip pain.  She is a thin individual.  She is not on blood thinning medication and not a diabetic.  I was able to review all of her past medical history and medications within epic.  At this point her right hip pain is daily and it is 10 out of 10.  It is detrimentally affecting her mobility, her quality of life and her actives of daily living.  On exam she has significant limitations with range of motion of her right hip with severe pain in the groin.  The MRI is reviewed as well as plain films of her right hip.  There is significant subchondral edema all throughout the femoral head and neck and there is some evidence of femoral head collapse.  There is also a large paralabral cyst and bone-on-bone arthritic changes.  This is seen on plain film as well.  We had a long and thorough discussion about hip replacement surgery.  I discussed the risks and benefits of the surgery and what to expect from an intraoperative and postoperative standpoint.  I went over her imaging studies and a hip replacement model and gave her handout about hip replacement surgery.  Her significant other is with her today as well.  She needs to start using a cane or walker to offload that hip in the interim while we work on getting her scheduled for a right total hip arthroplasty.  All questions and concerns were answered and addressed.  She understands we will be in touch soon with scheduling her for a right hip replacement.

## 2024-04-17 ENCOUNTER — Other Ambulatory Visit: Payer: Self-pay | Admitting: Pulmonary Disease

## 2024-04-17 DIAGNOSIS — J441 Chronic obstructive pulmonary disease with (acute) exacerbation: Secondary | ICD-10-CM

## 2024-04-17 DIAGNOSIS — J449 Chronic obstructive pulmonary disease, unspecified: Secondary | ICD-10-CM

## 2024-04-19 ENCOUNTER — Ambulatory Visit: Admitting: Pulmonary Disease

## 2024-05-22 ENCOUNTER — Encounter: Admitting: Orthopaedic Surgery

## 2024-07-21 ENCOUNTER — Ambulatory Visit: Admitting: Pulmonary Disease
